# Patient Record
Sex: Female | Born: 1960
Health system: Southern US, Community
[De-identification: ages and names within clinical notes are randomized; demographics above are authoritative.]

## PROBLEM LIST (undated history)

## (undated) DIAGNOSIS — I10 Essential (primary) hypertension: Secondary | ICD-10-CM

## (undated) DIAGNOSIS — F329 Major depressive disorder, single episode, unspecified: Secondary | ICD-10-CM

## (undated) DIAGNOSIS — K635 Polyp of colon: Secondary | ICD-10-CM

## (undated) DIAGNOSIS — M069 Rheumatoid arthritis, unspecified: Secondary | ICD-10-CM

## (undated) DIAGNOSIS — F419 Anxiety disorder, unspecified: Secondary | ICD-10-CM

## (undated) DIAGNOSIS — J8410 Pulmonary fibrosis, unspecified: Secondary | ICD-10-CM

## (undated) DIAGNOSIS — L4050 Arthropathic psoriasis, unspecified: Secondary | ICD-10-CM

## (undated) DIAGNOSIS — F32A Depression, unspecified: Secondary | ICD-10-CM

## (undated) DIAGNOSIS — E785 Hyperlipidemia, unspecified: Secondary | ICD-10-CM

## (undated) HISTORY — PX: TUBAL LIGATION: SHX77

## (undated) HISTORY — PX: BREAST CYST EXCISION: SHX579

## (undated) HISTORY — DX: Polyp of colon: K63.5

## (undated) HISTORY — DX: Hyperlipidemia, unspecified: E78.5

## (undated) HISTORY — DX: Anxiety disorder, unspecified: F41.9

## (undated) HISTORY — DX: Pulmonary fibrosis, unspecified: J84.10

## (undated) HISTORY — DX: Depression, unspecified: F32.A

## (undated) HISTORY — DX: Arthropathic psoriasis, unspecified: L40.50

## (undated) HISTORY — DX: Essential (primary) hypertension: I10

## (undated) HISTORY — DX: Major depressive disorder, single episode, unspecified: F32.9

## (undated) HISTORY — DX: Rheumatoid arthritis, unspecified: M06.9

## (undated) HISTORY — PX: CHOLECYSTECTOMY: SHX55

---

## 1997-12-27 ENCOUNTER — Other Ambulatory Visit: Admission: RE | Admit: 1997-12-27 | Discharge: 1997-12-27 | Payer: Self-pay | Admitting: Obstetrics and Gynecology

## 1999-02-23 ENCOUNTER — Other Ambulatory Visit: Admission: RE | Admit: 1999-02-23 | Discharge: 1999-02-23 | Payer: Self-pay | Admitting: Family Medicine

## 1999-12-15 ENCOUNTER — Encounter: Payer: Self-pay | Admitting: *Deleted

## 1999-12-15 ENCOUNTER — Emergency Department (HOSPITAL_COMMUNITY): Admission: EM | Admit: 1999-12-15 | Discharge: 1999-12-15 | Payer: Self-pay | Admitting: *Deleted

## 2000-02-25 ENCOUNTER — Other Ambulatory Visit: Admission: RE | Admit: 2000-02-25 | Discharge: 2000-02-25 | Payer: Self-pay | Admitting: Obstetrics and Gynecology

## 2001-04-21 ENCOUNTER — Encounter: Admission: RE | Admit: 2001-04-21 | Discharge: 2001-05-26 | Payer: Self-pay | Admitting: Family Medicine

## 2001-12-07 ENCOUNTER — Inpatient Hospital Stay (HOSPITAL_COMMUNITY): Admission: EM | Admit: 2001-12-07 | Discharge: 2001-12-11 | Payer: Self-pay | Admitting: Emergency Medicine

## 2001-12-07 ENCOUNTER — Encounter: Payer: Self-pay | Admitting: Emergency Medicine

## 2001-12-09 ENCOUNTER — Encounter: Payer: Self-pay | Admitting: Family Medicine

## 2001-12-11 ENCOUNTER — Encounter: Payer: Self-pay | Admitting: Family Medicine

## 2001-12-17 ENCOUNTER — Encounter: Admission: RE | Admit: 2001-12-17 | Discharge: 2001-12-17 | Payer: Self-pay | Admitting: Family Medicine

## 2002-08-27 ENCOUNTER — Other Ambulatory Visit: Admission: RE | Admit: 2002-08-27 | Discharge: 2002-08-27 | Payer: Self-pay | Admitting: Family Medicine

## 2004-01-23 ENCOUNTER — Other Ambulatory Visit: Admission: RE | Admit: 2004-01-23 | Discharge: 2004-01-23 | Payer: Self-pay | Admitting: Family Medicine

## 2010-10-06 ENCOUNTER — Encounter: Payer: Self-pay | Admitting: Family Medicine

## 2012-01-07 ENCOUNTER — Encounter: Payer: Self-pay | Admitting: Internal Medicine

## 2012-01-24 ENCOUNTER — Encounter: Payer: Self-pay | Admitting: Internal Medicine

## 2012-01-24 ENCOUNTER — Ambulatory Visit (AMBULATORY_SURGERY_CENTER): Payer: Medicare Other

## 2012-01-24 VITALS — Ht 65.0 in | Wt 179.0 lb

## 2012-01-24 DIAGNOSIS — Z1211 Encounter for screening for malignant neoplasm of colon: Secondary | ICD-10-CM

## 2012-01-24 MED ORDER — NA SULFATE-K SULFATE-MG SULF 17.5-3.13-1.6 GM/177ML PO SOLN: 1.0000 | Freq: Once | ORAL | Status: DC

## 2012-02-06 ENCOUNTER — Ambulatory Visit (AMBULATORY_SURGERY_CENTER): Payer: Medicare Other | Admitting: Internal Medicine

## 2012-02-06 ENCOUNTER — Encounter: Payer: Self-pay | Admitting: Internal Medicine

## 2012-02-06 VITALS — BP 117/62 | HR 66 | Temp 97.8°F | Resp 16 | Ht 65.0 in | Wt 179.0 lb

## 2012-02-06 DIAGNOSIS — D126 Benign neoplasm of colon, unspecified: Secondary | ICD-10-CM

## 2012-02-06 DIAGNOSIS — Z1211 Encounter for screening for malignant neoplasm of colon: Secondary | ICD-10-CM

## 2012-02-06 MED ORDER — SODIUM CHLORIDE 0.9 % IV SOLN: 500.0000 mL | INTRAVENOUS | Status: DC

## 2012-02-06 NOTE — Op Note (Signed)
[facility] [address] [place], [place]  [address]  COLONOSCOPY PROCEDURE REPORT  PATIENT:  Lisa, Ball  MR#:  [id number] BIRTHDATE:  [birth year], 51 yrs. old  GENDER:  female ENDOSCOPIST:  Carie Caddy. Kenneshia Rehm, MD REF. BY:  Rudi Heap, M.D. PROCEDURE DATE:  02/06/2012 PROCEDURE:  Colonoscopy with snare polypectomy, Colon with cold biopsy polypectomy ASA CLASS:  Class II INDICATIONS:  Routine Risk Screening, 1st colonoscopy MEDICATIONS:   MAC sedation, administered by CRNA, propofol (Diprivan) 400 mg IV  DESCRIPTION OF PROCEDURE:   After the risks benefits and alternatives of the procedure were thoroughly explained, informed consent was obtained.  Digital rectal exam was performed and revealed no rectal masses.   The LB [id number]-[id number] endoscope was introduced through the anus and advanced to the cecum, which was identified by both the appendix and ileocecal valve, without limitations.  The quality of the prep was Suprep good.  The instrument was then slowly withdrawn as the colon was fully examined. <<PROCEDUREIMAGES>> FINDINGS:  Two sessile polyps, 2 mm and 5 mm were found in the cecum. Polyps were snared without cautery. Retrieval was successful.   Two sessile polyps, 3 - 6 mm were found in the transverse colon. Polyps were snared without cautery. Retrieval was successful.  Three sessile polyps were found in the descending colon. Two polyps, 2 mm, were removed using cold biopsy forceps. The third polyp ,5 mm, was snared without cautery. Retrieval was successful.  Two sessile, 4 - 5 mm, polyps were found in the sigmoid colon. Polyps were snared without cautery. Retrieval was successful.   Two sessile polyps, 4 mm, were found in the rectum. Polyps were snared without cautery. Retrieval was successful. Retroflexed views in the rectum revealed internal hemorrhoids. The scope was then withdrawn from the cecum and the procedure completed.  COMPLICATIONS:   None  ENDOSCOPIC IMPRESSION: 1) Two polyps in the cecum. Removed and sent to pathology. 2) Two polyps in the transverse colon. Removed and sent to pathology. 3) Three polyps in the descending colon. Removed and sent to pathology. 4) Two polyps in the sigmoid colon. Removed and sent to pathology. 5) Two polyps in the rectum. Removed and sent to pathology. 6) Internal hemorrhoids  RECOMMENDATIONS: 1) Hold aspirin, aspirin products, and anti-inflammatory medication for 2 weeks. 2) Await pathology results    3) If the all the polyps removed today are proven to be adenomatous (pre-cancerous) polyps, you will need a colonoscopy in 1 years. Otherwise, the exam should be repeated in 3 years. You will receive a letter within 1-2 weeks with the results of your bi opsy as well as final recommendations. Please call my office if you have not received a letter after 3 weeks.    4) One of your biggest health concerns is your smoking. This increases your risk for most cancers and serious cardiovascular diseases such as strokes, heart attacks. You should try your best to stop. If you need assistance, please contact your PCP or Sm oking Cessation Class at [facility] [phone]) or [facility] Quit-Line (1-800-QUIT-NOW).  Carie Caddy. Rhea Belton, MD  CC:  Rudi Heap, MD The Patient  n. eSIGNED:   Carie Caddy. Ioan Landini at 02/06/2012 11:32 AM  Servando Snare, [phone]

## 2012-02-06 NOTE — Patient Instructions (Signed)
Discharge instructions given with verbal understanding. Handouts on polyps and hemorrhoids given. Resume previous medications. Hold aspirin and aspirin products for two weeks.YOU HAD AN ENDOSCOPIC PROCEDURE TODAY AT THE [facility]: Refer to the procedure report that was given to you for any specific questions about what was found during the examination.  If the procedure report does not answer your questions, please call your gastroenterologist to clarify.  If you requested that your care partner not be given the details of your procedure findings, then the procedure report has been included in a sealed envelope for you to review at your convenience later.  YOU SHOULD EXPECT: Some feelings of bloating in the abdomen. Passage of more gas than usual.  Walking can help get rid of the air that was put into your GI tract during the procedure and reduce the bloating. If you had a lower endoscopy (such as a colonoscopy or flexible sigmoidoscopy) you may notice spotting of blood in your stool or on the toilet paper. If you underwent a bowel prep for your procedure, then you may not have a normal bowel movement for a few days.  DIET: Your first meal following the procedure should be a light meal and then it is ok to progress to your normal diet.  A half-sandwich or bowl of soup is an example of a good first meal.  Heavy or fried foods are harder to digest and may make you feel nauseous or bloated.  Likewise meals heavy in dairy and vegetables can cause extra gas to form and this can also increase the bloating.  Drink plenty of fluids but you should avoid alcoholic beverages for 24 hours.  ACTIVITY: Your care partner should take you home directly after the procedure.  You should plan to take it easy, moving slowly for the rest of the day.  You can resume normal activity the day after the procedure however you should NOT DRIVE or use heavy machinery for 24 hours (because of the sedation medicines used  during the test).    SYMPTOMS TO REPORT IMMEDIATELY: A gastroenterologist can be reached at any hour.  During normal business hours, 8:30 AM to 5:00 PM Monday through Friday, call [phone].  After hours and on weekends, please call the GI answering service at [phone] who will take a message and have the physician on call contact you.   Following lower endoscopy (colonoscopy or flexible sigmoidoscopy):  Excessive amounts of blood in the stool  Significant tenderness or worsening of abdominal pains  Swelling of the abdomen that is new, acute  Fever of 100F or higher  FOLLOW UP: If any biopsies were taken you will be contacted by phone or by letter within the next 1-3 weeks.  Call your gastroenterologist if you have not heard about the biopsies in 3 weeks.  Our staff will call the home number listed on your records the next business day following your procedure to check on you and address any questions or concerns that you may have at that time regarding the information given to you following your procedure. This is a courtesy call and so if there is no answer at the home number and we have not heard from you through the emergency physician on call, we will assume that you have returned to your regular daily activities without incident.  SIGNATURES/CONFIDENTIALITY: You and/or your care partner have signed paperwork which will be entered into your electronic medical record.  These signatures attest to the fact that that  the information above on your After Visit Summary has been reviewed and is understood.  Full responsibility of the confidentiality of this discharge information lies with you and/or your care-partner.

## 2012-02-06 NOTE — Progress Notes (Signed)
Patient did not experience any of the following events: a burn prior to discharge; a fall within the facility; wrong site/side/patient/procedure/implant event; or a hospital transfer or hospital admission upon discharge from the facility. (G8907) Patient did not have preoperative order for IV antibiotic SSI prophylaxis. (G8918)  

## 2012-02-07 ENCOUNTER — Telehealth: Payer: Self-pay | Admitting: *Deleted

## 2012-02-07 NOTE — Telephone Encounter (Signed)
  Follow up Call-  Call back number 02/06/2012  Post procedure Call Back phone  # [phone]     Patient questions:  Do you have a fever, pain , or abdominal swelling? no Pain Score  0 *  Have you tolerated food without any problems? yes  Have you been able to return to your normal activities? yes  Do you have any questions about your discharge instructions: Diet   no Medications  no Follow up visit  no  Do you have questions or concerns about your Care? no  Actions: * If pain score is 4 or above: No action needed, pain <4.

## 2012-02-14 ENCOUNTER — Encounter: Payer: Self-pay | Admitting: Internal Medicine

## 2012-02-16 ENCOUNTER — Encounter: Payer: Self-pay | Admitting: Internal Medicine

## 2012-12-13 ENCOUNTER — Other Ambulatory Visit: Payer: Self-pay | Admitting: Family Medicine

## 2012-12-29 ENCOUNTER — Other Ambulatory Visit: Payer: Self-pay | Admitting: Nurse Practitioner

## 2013-01-10 ENCOUNTER — Other Ambulatory Visit: Payer: Self-pay | Admitting: Nurse Practitioner

## 2013-01-11 ENCOUNTER — Other Ambulatory Visit: Payer: Self-pay | Admitting: Nurse Practitioner

## 2013-01-11 NOTE — Telephone Encounter (Signed)
LAST OV 1/14 

## 2013-01-12 ENCOUNTER — Other Ambulatory Visit: Payer: Self-pay | Admitting: Nurse Practitioner

## 2013-02-09 ENCOUNTER — Other Ambulatory Visit: Payer: Self-pay | Admitting: Nurse Practitioner

## 2013-03-11 ENCOUNTER — Other Ambulatory Visit: Payer: Self-pay | Admitting: Nurse Practitioner

## 2013-03-22 ENCOUNTER — Encounter: Payer: Self-pay | Admitting: Nurse Practitioner

## 2013-03-22 ENCOUNTER — Ambulatory Visit (INDEPENDENT_AMBULATORY_CARE_PROVIDER_SITE_OTHER): Payer: Medicare Other | Admitting: Nurse Practitioner

## 2013-03-22 VITALS — BP 115/74 | HR 64 | Temp 98.4°F

## 2013-03-22 DIAGNOSIS — J8410 Pulmonary fibrosis, unspecified: Secondary | ICD-10-CM | POA: Insufficient documentation

## 2013-03-22 DIAGNOSIS — E785 Hyperlipidemia, unspecified: Secondary | ICD-10-CM

## 2013-03-22 DIAGNOSIS — L4050 Arthropathic psoriasis, unspecified: Secondary | ICD-10-CM

## 2013-03-22 DIAGNOSIS — M8580 Other specified disorders of bone density and structure, unspecified site: Secondary | ICD-10-CM | POA: Insufficient documentation

## 2013-03-22 DIAGNOSIS — I10 Essential (primary) hypertension: Secondary | ICD-10-CM

## 2013-03-22 DIAGNOSIS — M899 Disorder of bone, unspecified: Secondary | ICD-10-CM

## 2013-03-22 DIAGNOSIS — Z0000 Encounter for general adult medical examination without abnormal findings: Secondary | ICD-10-CM

## 2013-03-22 DIAGNOSIS — M069 Rheumatoid arthritis, unspecified: Secondary | ICD-10-CM

## 2013-03-22 DIAGNOSIS — F329 Major depressive disorder, single episode, unspecified: Secondary | ICD-10-CM | POA: Insufficient documentation

## 2013-03-22 DIAGNOSIS — Z01419 Encounter for gynecological examination (general) (routine) without abnormal findings: Secondary | ICD-10-CM

## 2013-03-22 DIAGNOSIS — Z124 Encounter for screening for malignant neoplasm of cervix: Secondary | ICD-10-CM

## 2013-03-22 DIAGNOSIS — F3289 Other specified depressive episodes: Secondary | ICD-10-CM

## 2013-03-22 DIAGNOSIS — F32A Depression, unspecified: Secondary | ICD-10-CM

## 2013-03-22 LAB — POCT URINALYSIS DIPSTICK
Bilirubin, UA: NEGATIVE
Glucose, UA: NEGATIVE
Ketones, UA: NEGATIVE

## 2013-03-22 LAB — POCT CBC
Granulocyte percent: 54.6 %G (ref 37–80)
Hemoglobin: 14.5 g/dL (ref 12.2–16.2)
MCHC: 35.9 g/dL — AB (ref 31.8–35.4)
MPV: 9.2 fL (ref 0–99.8)
POC Granulocyte: 3.5 (ref 2–6.9)
POC LYMPH PERCENT: 42.1 %L (ref 10–50)

## 2013-03-22 LAB — POCT UA - MICROSCOPIC ONLY
Bacteria, U Microscopic: NEGATIVE
Yeast, UA: NEGATIVE

## 2013-03-22 MED ORDER — ESCITALOPRAM OXALATE 20 MG PO TABS: 20.0000 mg | ORAL_TABLET | Freq: Every day | ORAL | Status: DC

## 2013-03-22 MED ORDER — FENOFIBRATE 160 MG PO TABS: 160.0000 mg | ORAL_TABLET | Freq: Every day | ORAL | Status: DC

## 2013-03-22 MED ORDER — LISINOPRIL-HYDROCHLOROTHIAZIDE 20-12.5 MG PO TABS: 1.0000 | ORAL_TABLET | Freq: Every day | ORAL | Status: DC

## 2013-03-22 MED ORDER — ATORVASTATIN CALCIUM 40 MG PO TABS: 40.0000 mg | ORAL_TABLET | Freq: Every day | ORAL | Status: DC

## 2013-03-22 NOTE — Progress Notes (Signed)
Subjective:    Patient ID: Lisa Ball, female    DOB: 31-Jul-1961, 52 y.o.   MRN: [id number]  HPI Patient here today for annual exam and PAP- SHe is doing quite well- has no complaints today Patient Active Problem List   Diagnosis Date Noted  . Depression 03/22/2013  . Hypertension 03/22/2013  . Postinflammatory pulmonary fibrosis 03/22/2013  . Osteopenia 03/22/2013  . Hyperlipidemia 03/22/2013  . Psoriatic arthritis 03/22/2013   Outpatient Encounter Prescriptions as of 03/22/2013  Medication Sig Dispense Refill  . atorvastatin (LIPITOR) 40 MG tablet TAKE ONE TABLET BY MOUTH ONE TIME DAILY  30 tablet  2  . escitalopram (LEXAPRO) 20 MG tablet TAKE ONE TABLET BY MOUTH ONE TIME DAILY  30 tablet  0  . etanercept (ENBREL) 25 MG injection Inject 50 mg into the skin once a week.      . fenofibrate 160 MG tablet TAKE ONE TABLET BY MOUTH AT BEDTIME  30 tablet  3  . hydroxychloroquine (PLAQUENIL) 200 MG tablet Take by mouth. Take 2 tabs bid      . lisinopril-hydrochlorothiazide (PRINZIDE,ZESTORETIC) 20-12.5 MG per tablet Take 1 tablet by mouth daily.      Marland Kitchen omega-3 acid ethyl esters (LOVAZA) 1 G capsule Take 1 g by mouth 2 (two) times daily.      . [DISCONTINUED] atorvastatin (LIPITOR) 20 MG tablet Take 20 mg by mouth daily.       No facility-administered encounter medications on file as of 03/22/2013.       Review of Systems  All other systems reviewed and are negative.       Objective:   Physical Exam  Constitutional: She is oriented to person, place, and time. She appears well-developed and well-nourished.  HENT:  Head: Normocephalic.  Right Ear: Hearing, tympanic membrane, external ear and ear canal normal.  Left Ear: Hearing, tympanic membrane, external ear and ear canal normal.  Nose: Nose normal.  Mouth/Throat: Uvula is midline and oropharynx is clear and moist.  Eyes: Conjunctivae and EOM are normal. Pupils are equal, round, and reactive to light.  Neck: Normal range of  motion and full passive range of motion without pain. Neck supple. No JVD present. Carotid bruit is not present. No mass and no thyromegaly present.  Cardiovascular: Normal rate, normal heart sounds and intact distal pulses.   No murmur heard. Pulmonary/Chest: Effort normal and breath sounds normal. Right breast exhibits no inverted nipple, no mass, no nipple discharge, no skin change and no tenderness. Left breast exhibits no inverted nipple, no mass, no nipple discharge, no skin change and no tenderness.  Abdominal: Soft. Bowel sounds are normal. She exhibits no mass. There is no tenderness.  Genitourinary: Vagina normal and uterus normal. No breast swelling, tenderness, discharge or bleeding.  bimanual exam-No adnexal masses or tenderness.  Cervix parous and pink and friable   Musculoskeletal: Normal range of motion.  Lymphadenopathy:    She has no cervical adenopathy.  Neurological: She is alert and oriented to person, place, and time.  Skin: Skin is warm and dry.  Psychiatric: She has a normal mood and affect. Her behavior is normal. Judgment and thought content normal.   BP 115/74  Pulse 64  Temp(Src) 98.4 F (36.9 C) (Oral)        Assessment & Plan:   1. Annual physical exam   2. Rheumatoid arthritis   3. Depression   4. Hypertension   5. Postinflammatory pulmonary fibrosis   6. Osteopenia   7. Hyperlipidemia  8. Psoriatic arthritis   9. Encounter for routine gynecological examination    Orders Placed This Encounter  Procedures  . COMPLETE METABOLIC PANEL WITH GFR  . NMR Lipoprofile with Lipids  . Thyroid Panel With TSH  . POCT urinalysis dipstick  . POCT UA - Microscopic Only  . POCT CBC   Meds ordered this encounter  Medications  . atorvastatin (LIPITOR) 40 MG tablet    Sig: Take 1 tablet (40 mg total) by mouth daily.    Dispense:  30 tablet    Refill:  5    Order Specific Question:  Supervising Provider    Answer:  Ernestina Penna [[id number]]  .  escitalopram (LEXAPRO) 20 MG tablet    Sig: Take 1 tablet (20 mg total) by mouth daily.    Dispense:  30 tablet    Refill:  5    Need to be seen before next refill    Order Specific Question:  Supervising Provider    Answer:  Ernestina Penna [[id number]]  . fenofibrate 160 MG tablet    Sig: Take 1 tablet (160 mg total) by mouth daily.    Dispense:  30 tablet    Refill:  5    Order Specific Question:  Supervising Provider    Answer:  Ernestina Penna [[id number]]  . lisinopril-hydrochlorothiazide (PRINZIDE,ZESTORETIC) 20-12.5 MG per tablet    Sig: Take 1 tablet by mouth daily.    Dispense:  30 tablet    Refill:  5    Order Specific Question:  Supervising Provider    Answer:  Ernestina Penna [[id number]]   Continue all meds Labs pending Diet and exercsie encouraged Follow up in 6 mnths Hemocult cards given  [name], FNP

## 2013-03-22 NOTE — Patient Instructions (Addendum)

## 2013-03-23 LAB — THYROID PANEL WITH TSH: T4, Total: 9.8 ug/dL (ref 5.0–12.5)

## 2013-03-23 LAB — NMR LIPOPROFILE WITH LIPIDS
HDL Size: 8.6 nm — ABNORMAL LOW (ref >=9.2)
HDL-C: 22 mg/dL — ABNORMAL LOW (ref >=40)
LDL (calc): 66 mg/dL (ref <100)
LDL Size: 19.7 nm — ABNORMAL LOW (ref >20.5)
LP-IR Score: 74 — ABNORMAL HIGH (ref <=45)
Small LDL Particle Number: 1056 nmol/L — ABNORMAL HIGH (ref <=527)

## 2013-03-23 LAB — COMPLETE METABOLIC PANEL WITH GFR
BUN: 14 mg/dL (ref 6–23)
CO2: 29 mEq/L (ref 19–32)
Creat: 0.92 mg/dL (ref 0.50–1.10)
GFR, Est African American: 83 mL/min
GFR, Est Non African American: 72 mL/min
Glucose, Bld: 88 mg/dL (ref 70–99)
Total Bilirubin: 0.6 mg/dL (ref 0.3–1.2)

## 2013-03-23 LAB — PAP IG W/ RFLX HPV ASCU

## 2013-03-25 ENCOUNTER — Other Ambulatory Visit (INDEPENDENT_AMBULATORY_CARE_PROVIDER_SITE_OTHER): Payer: Medicare Other

## 2013-03-25 DIAGNOSIS — Z1212 Encounter for screening for malignant neoplasm of rectum: Secondary | ICD-10-CM

## 2013-03-26 LAB — FECAL OCCULT BLOOD, IMMUNOCHEMICAL: Fecal Occult Blood: NEGATIVE

## 2013-04-14 ENCOUNTER — Encounter: Payer: Self-pay | Admitting: *Deleted

## 2013-04-19 ENCOUNTER — Encounter: Payer: Self-pay | Admitting: *Deleted

## 2013-09-19 ENCOUNTER — Other Ambulatory Visit: Payer: Self-pay | Admitting: Nurse Practitioner

## 2013-10-04 ENCOUNTER — Other Ambulatory Visit: Payer: Self-pay | Admitting: Nurse Practitioner

## 2013-10-05 NOTE — Telephone Encounter (Signed)
Last seen 03/22/13  MMM  Last lipids 03/22/13

## 2013-10-06 ENCOUNTER — Telehealth: Payer: Self-pay | Admitting: *Deleted

## 2013-10-06 NOTE — Telephone Encounter (Signed)
ntbs for labs 

## 2013-10-06 NOTE — Telephone Encounter (Signed)
Medication called to [facility] vm and please tell pt,NTBS.

## 2013-10-30 ENCOUNTER — Other Ambulatory Visit: Payer: Self-pay | Admitting: Family Medicine

## 2013-11-12 ENCOUNTER — Other Ambulatory Visit: Payer: Self-pay | Admitting: Nurse Practitioner

## 2013-11-16 NOTE — Telephone Encounter (Signed)
Last seen 03/22/13  MMM

## 2013-11-16 NOTE — Telephone Encounter (Signed)
Patient NTBS for follow up and lab work  

## 2013-12-02 ENCOUNTER — Other Ambulatory Visit: Payer: Self-pay | Admitting: Nurse Practitioner

## 2013-12-14 ENCOUNTER — Other Ambulatory Visit: Payer: Self-pay | Admitting: Nurse Practitioner

## 2013-12-29 ENCOUNTER — Other Ambulatory Visit: Payer: Self-pay | Admitting: Nurse Practitioner

## 2014-01-15 ENCOUNTER — Other Ambulatory Visit: Payer: Self-pay | Admitting: Nurse Practitioner

## 2014-01-17 NOTE — Telephone Encounter (Signed)
Patient NTBS for follow up and lab work  

## 2014-01-17 NOTE — Telephone Encounter (Signed)
Last seen 03/22/2013

## 2014-01-29 ENCOUNTER — Other Ambulatory Visit: Payer: Self-pay | Admitting: Nurse Practitioner

## 2014-01-31 NOTE — Telephone Encounter (Signed)
Last seen and last lipid 03/22/13  MMM

## 2014-01-31 NOTE — Telephone Encounter (Signed)
No refill on meds without being see for follow up and labs

## 2014-02-11 ENCOUNTER — Encounter: Payer: Self-pay | Admitting: Internal Medicine

## 2014-02-14 ENCOUNTER — Other Ambulatory Visit: Payer: Self-pay | Admitting: Nurse Practitioner

## 2014-02-24 ENCOUNTER — Other Ambulatory Visit: Payer: Self-pay | Admitting: Nurse Practitioner

## 2014-02-25 NOTE — Telephone Encounter (Signed)
No refills-Patient NTBS for follow up and lab work. Not seen since July 2014

## 2014-03-02 ENCOUNTER — Other Ambulatory Visit: Payer: Self-pay | Admitting: Nurse Practitioner

## 2014-03-05 ENCOUNTER — Other Ambulatory Visit: Payer: Self-pay | Admitting: Family Medicine

## 2014-03-16 ENCOUNTER — Ambulatory Visit (INDEPENDENT_AMBULATORY_CARE_PROVIDER_SITE_OTHER): Payer: Commercial Managed Care - HMO | Admitting: Nurse Practitioner

## 2014-03-16 ENCOUNTER — Encounter: Payer: Self-pay | Admitting: Nurse Practitioner

## 2014-03-16 VITALS — BP 106/70 | HR 72 | Temp 98.6°F | Ht 65.0 in | Wt 178.6 lb

## 2014-03-16 DIAGNOSIS — F3289 Other specified depressive episodes: Secondary | ICD-10-CM

## 2014-03-16 DIAGNOSIS — F32A Depression, unspecified: Secondary | ICD-10-CM

## 2014-03-16 DIAGNOSIS — M8580 Other specified disorders of bone density and structure, unspecified site: Secondary | ICD-10-CM

## 2014-03-16 DIAGNOSIS — E785 Hyperlipidemia, unspecified: Secondary | ICD-10-CM

## 2014-03-16 DIAGNOSIS — M949 Disorder of cartilage, unspecified: Secondary | ICD-10-CM

## 2014-03-16 DIAGNOSIS — F329 Major depressive disorder, single episode, unspecified: Secondary | ICD-10-CM

## 2014-03-16 DIAGNOSIS — M899 Disorder of bone, unspecified: Secondary | ICD-10-CM

## 2014-03-16 DIAGNOSIS — I10 Essential (primary) hypertension: Secondary | ICD-10-CM

## 2014-03-16 DIAGNOSIS — L4050 Arthropathic psoriasis, unspecified: Secondary | ICD-10-CM

## 2014-03-16 MED ORDER — LISINOPRIL-HYDROCHLOROTHIAZIDE 20-12.5 MG PO TABS: ORAL_TABLET | ORAL | Status: DC

## 2014-03-16 MED ORDER — FENOFIBRATE 160 MG PO TABS: ORAL_TABLET | ORAL | Status: DC

## 2014-03-16 MED ORDER — ESCITALOPRAM OXALATE 20 MG PO TABS: ORAL_TABLET | ORAL | Status: DC

## 2014-03-16 MED ORDER — ATORVASTATIN CALCIUM 40 MG PO TABS: ORAL_TABLET | ORAL | Status: DC

## 2014-03-16 NOTE — Patient Instructions (Signed)

## 2014-03-16 NOTE — Progress Notes (Signed)
Subjective:    Patient ID: Lisa Ball, female    DOB: [birth year], 53 y.o.   MRN: [id number]  Hypertension This is a chronic problem. The current episode started more than 1 year ago. The problem is unchanged. The problem is controlled. Pertinent negatives include no chest pain, headaches, palpitations, peripheral edema, shortness of breath or sweats. Risk factors for coronary artery disease include dyslipidemia, family history, post-menopausal state and smoking/tobacco exposure. Past treatments include ACE inhibitors and diuretics. The current treatment provides significant improvement. There are no compliance problems.   Hyperlipidemia This is a chronic problem. The current episode started more than 1 year ago. The problem is controlled. Recent lipid tests were reviewed and are high. Pertinent negatives include no chest pain or shortness of breath. Current antihyperlipidemic treatment includes fibric acid derivatives and statins. The current treatment provides moderate improvement of lipids. Compliance problems include adherence to diet and adherence to exercise.  Risk factors for coronary artery disease include dyslipidemia, family history, hypertension, a sedentary lifestyle and post-menopausal.  psoriatic arthritis Is on paquenel, and enbrel-patient sees rheumatologist every 3-6 months- IS currently doing okay. Depression Patient takes lexapro- works well- keeps her from feeling down and anxious.  Review of Systems  Respiratory: Negative for shortness of breath.   Cardiovascular: Negative for chest pain and palpitations.  Neurological: Negative for headaches.       Objective:   Physical Exam  Constitutional: She is oriented to person, place, and time. She appears well-developed and well-nourished.  HENT:  Nose: Nose normal.  Mouth/Throat: Oropharynx is clear and moist.  Eyes: EOM are normal.  Neck: Trachea normal, normal range of motion and full passive range of motion without  pain. Neck supple. No JVD present. Carotid bruit is not present. No thyromegaly present.  Cardiovascular: Normal rate, regular rhythm, normal heart sounds and intact distal pulses.  Exam reveals no gallop and no friction rub.   No murmur heard. Pulmonary/Chest: Effort normal and breath sounds normal.  Abdominal: Soft. Bowel sounds are normal. She exhibits no distension and no mass. There is no tenderness.  Musculoskeletal: Normal range of motion.  Lymphadenopathy:    She has no cervical adenopathy.  Neurological: She is alert and oriented to person, place, and time. She has normal reflexes.  Skin: Skin is warm and dry.  Plaque psoriasis all over body  Psychiatric: She has a normal mood and affect. Her behavior is normal. Judgment and thought content normal.   BP 106/70  Pulse 72  Temp(Src) 98.6 F (37 C) (Oral)  Ht _0  (1.651 m)  Wt 178 lb 9.6 oz (81.012 kg)  BMI 29.72 kg/m2        Assessment & Plan:   1. Psoriatic arthritis   2. Osteopenia   3. Essential hypertension   4. Hyperlipidemia   5. Depression    Orders Placed This Encounter  Procedures  . CMP14+EGFR  . NMR, lipoprofile   Meds ordered this encounter  Medications  . lisinopril-hydrochlorothiazide (PRINZIDE,ZESTORETIC) 20-12.5 MG per tablet    Sig: TAKE ONE TABLET BY MOUTH ONE TIME DAILY    Dispense:  30 tablet    Refill:  5    Order Specific Question:  Supervising Provider    Answer:  Chipper Herb [[id number]]  . fenofibrate 160 MG tablet    Sig: TAKE ONE TABLET BY MOUTH ONE TIME DAILY    Dispense:  30 tablet    Refill:  5    Order Specific Question:  Supervising Provider  Answer:  Chipper Herb [[id number]]  . escitalopram (LEXAPRO) 20 MG tablet    Sig: TAKE ONE TABLET BY MOUTH ONE TIME DAILY    Dispense:  30 tablet    Refill:  5    Order Specific Question:  Supervising Provider    Answer:  Chipper Herb [[id number]]  . atorvastatin (LIPITOR) 40 MG tablet    Sig: TAKE ONE TABLET BY MOUTH ONE TIME DAILY     Dispense:  30 tablet    Refill:  5    Order Specific Question:  Supervising Provider    Answer:  Chipper Herb [[id number]]   Keep follow up with rheumatologist Labs pending Health maintenance reviewed Diet and exercise encouraged Continue all meds Follow up  In 3 months    [place], FNP

## 2014-03-17 ENCOUNTER — Telehealth: Payer: Self-pay | Admitting: Family Medicine

## 2014-03-17 LAB — NMR, LIPOPROFILE
Cholesterol: 192 mg/dL (ref 100–199)
HDL CHOLESTEROL BY NMR: 43 mg/dL (ref >39)
HDL Particle Number: 37 umol/L (ref >=30.5)
LDL PARTICLE NUMBER: 1251 nmol/L — AB (ref <1000)
LDL Size: 20 nm (ref >20.5)
LDLC SERPL CALC-MCNC: 119 mg/dL — ABNORMAL HIGH (ref 0–99)
LP-IR Score: 72 — ABNORMAL HIGH (ref <=45)
SMALL LDL PARTICLE NUMBER: 831 nmol/L — AB (ref <=527)
TRIGLYCERIDES BY NMR: 150 mg/dL — AB (ref 0–149)

## 2014-03-17 LAB — CMP14+EGFR
A/G RATIO: 1.6 (ref 1.1–2.5)
ALT: 31 IU/L (ref 0–32)
AST: 25 IU/L (ref 0–40)
Albumin: 4.6 g/dL (ref 3.5–5.5)
Alkaline Phosphatase: 76 IU/L (ref 39–117)
BUN/Creatinine Ratio: 12 (ref 9–23)
BUN: 11 mg/dL (ref 6–24)
CALCIUM: 10.5 mg/dL — AB (ref 8.7–10.2)
CO2: 24 mmol/L (ref 18–29)
Chloride: 99 mmol/L (ref 97–108)
Creatinine, Ser: 0.92 mg/dL (ref 0.57–1.00)
GFR calc Af Amer: 82 mL/min/{1.73_m2} (ref >59)
GFR, EST NON AFRICAN AMERICAN: 71 mL/min/{1.73_m2} (ref >59)
GLUCOSE: 98 mg/dL (ref 65–99)
Globulin, Total: 2.9 g/dL (ref 1.5–4.5)
Potassium: 3.8 mmol/L (ref 3.5–5.2)
Sodium: 142 mmol/L (ref 134–144)
TOTAL PROTEIN: 7.5 g/dL (ref 6.0–8.5)
Total Bilirubin: 0.7 mg/dL (ref 0.0–1.2)

## 2014-03-17 NOTE — Telephone Encounter (Signed)
Message copied by Waverly Ferrari on Thu Mar 17, 2014  2:48 PM ------      Message from: Chevis Pretty      Created: Thu Mar 17, 2014  8:13 AM       Kidney and liver function stable      ldl particle numbers and ldl are a little high      Continue current meds- low fat diet and exercise and recheck in 3 months             ------

## 2014-03-23 NOTE — Telephone Encounter (Signed)
Message copied by Cline Crock on Wed Mar 23, 2014  9:32 AM ------      Message from: Chevis Pretty      Created: Thu Mar 17, 2014  8:13 AM       Kidney and liver function stable      ldl particle numbers and ldl are a little high      Continue current meds- low fat diet and exercise and recheck in 3 months             ------

## 2014-03-30 ENCOUNTER — Encounter: Payer: Self-pay | Admitting: Nurse Practitioner

## 2014-06-23 ENCOUNTER — Ambulatory Visit: Payer: Commercial Managed Care - HMO | Admitting: Nurse Practitioner

## 2014-07-21 ENCOUNTER — Ambulatory Visit: Payer: Commercial Managed Care - HMO | Admitting: Nurse Practitioner

## 2014-09-19 ENCOUNTER — Ambulatory Visit (INDEPENDENT_AMBULATORY_CARE_PROVIDER_SITE_OTHER): Payer: Commercial Managed Care - HMO | Admitting: Nurse Practitioner

## 2014-09-19 ENCOUNTER — Ambulatory Visit (INDEPENDENT_AMBULATORY_CARE_PROVIDER_SITE_OTHER): Payer: Commercial Managed Care - HMO

## 2014-09-19 ENCOUNTER — Encounter: Payer: Self-pay | Admitting: Nurse Practitioner

## 2014-09-19 VITALS — BP 125/73 | HR 74 | Temp 97.0°F | Ht 65.0 in | Wt 185.0 lb

## 2014-09-19 DIAGNOSIS — F329 Major depressive disorder, single episode, unspecified: Secondary | ICD-10-CM

## 2014-09-19 DIAGNOSIS — F17200 Nicotine dependence, unspecified, uncomplicated: Secondary | ICD-10-CM | POA: Insufficient documentation

## 2014-09-19 DIAGNOSIS — E785 Hyperlipidemia, unspecified: Secondary | ICD-10-CM

## 2014-09-19 DIAGNOSIS — Z720 Tobacco use: Secondary | ICD-10-CM

## 2014-09-19 DIAGNOSIS — F32A Depression, unspecified: Secondary | ICD-10-CM

## 2014-09-19 DIAGNOSIS — L4050 Arthropathic psoriasis, unspecified: Secondary | ICD-10-CM

## 2014-09-19 DIAGNOSIS — I10 Essential (primary) hypertension: Secondary | ICD-10-CM

## 2014-09-19 MED ORDER — ATORVASTATIN CALCIUM 40 MG PO TABS: ORAL_TABLET | ORAL | Status: DC

## 2014-09-19 MED ORDER — LISINOPRIL-HYDROCHLOROTHIAZIDE 20-12.5 MG PO TABS: ORAL_TABLET | ORAL | Status: DC

## 2014-09-19 MED ORDER — ESCITALOPRAM OXALATE 20 MG PO TABS: ORAL_TABLET | ORAL | Status: DC

## 2014-09-19 MED ORDER — FENOFIBRATE 160 MG PO TABS: ORAL_TABLET | ORAL | Status: DC

## 2014-09-19 NOTE — Progress Notes (Signed)
Subjective:    Patient ID: Lisa Ball, female    DOB: 1961-09-03, 54 y.o.   MRN: [id number]  Hypertension This is a chronic problem. The current episode started more than 1 year ago. The problem is unchanged. The problem is controlled. Pertinent negatives include no chest pain, headaches, palpitations or shortness of breath. Risk factors for coronary artery disease include dyslipidemia, family history and post-menopausal state. Past treatments include ACE inhibitors and diuretics. The current treatment provides significant improvement. Compliance problems include diet and exercise.  Hypertensive end-organ damage includes PVD.  Hyperlipidemia This is a chronic problem. The current episode started more than 1 year ago. The problem is uncontrolled. Recent lipid tests were reviewed and are variable. Factors aggravating her hyperlipidemia include thiazides. Pertinent negatives include no chest pain or shortness of breath. Current antihyperlipidemic treatment includes statins. The current treatment provides moderate improvement of lipids. Compliance problems include adherence to diet and adherence to exercise.  Risk factors for coronary artery disease include dyslipidemia, family history, hypertension and post-menopausal.  psoriatic arthritis Is on paquenel, and enbrel-patient sees rheumatologist every 3-6 months-  currently doing okay. Depression Patient takes lexapro- works well- keeps her from feeling down and anxious.  Review of Systems  Respiratory: Negative for shortness of breath.   Cardiovascular: Negative for chest pain and palpitations.  Neurological: Negative for headaches.       Objective:   Physical Exam  Constitutional: She is oriented to person, place, and time. She appears well-developed and well-nourished.  HENT:  Nose: Nose normal.  Mouth/Throat: Oropharynx is clear and moist.  Eyes: EOM are normal.  Neck: Trachea normal, normal range of motion and full passive range of  motion without pain. Neck supple. No JVD present. Carotid bruit is not present. No thyromegaly present.  Cardiovascular: Normal rate, regular rhythm, normal heart sounds and intact distal pulses.  Exam reveals no gallop and no friction rub.   No murmur heard. Pulmonary/Chest: Effort normal and breath sounds normal.  Abdominal: Soft. Bowel sounds are normal. She exhibits no distension and no mass. There is no tenderness.  Musculoskeletal: Normal range of motion.  Lymphadenopathy:    She has no cervical adenopathy.  Neurological: She is alert and oriented to person, place, and time. She has normal reflexes.  Skin: Skin is warm and dry.  Psychiatric: She has a normal mood and affect. Her behavior is normal. Judgment and thought content normal.   BP 125/73 mmHg  Pulse 74  Temp(Src) 97 F (36.1 C) (Oral)  Ht _0  (1.651 m)  Wt 185 lb (83.915 kg)  BMI 30.79 kg/m2  EKG- NSR-Mary-Margaret Hassell Done, FNP   Chest x ray- Chronic bronchitis changes-Preliminary reading by Ronnald Collum, FNP  [facility]       Assessment & Plan:    1. Essential hypertension Do not add salt to diet - lisinopril-hydrochlorothiazide (PRINZIDE,ZESTORETIC) 20-12.5 MG per tablet; TAKE ONE TABLET BY MOUTH ONE TIME DAILY  Dispense: 30 tablet; Refill: 5 - CMP14+EGFR - EKG 12-Lead  2. Psoriatic arthritis Keep foll  wup appointments with rheumatologist  3. Depression Stress management - escitalopram (LEXAPRO) 20 MG tablet; TAKE ONE TABLET BY MOUTH ONE TIME DAILY  Dispense: 30 tablet; Refill: 5  4. Hyperlipidemia Low fat diet - atorvastatin (LIPITOR) 40 MG tablet; TAKE ONE TABLET BY MOUTH ONE TIME DAILY  Dispense: 30 tablet; Refill: 5 - fenofibrate 160 MG tablet; TAKE ONE TABLET BY MOUTH ONE TIME DAILY  Dispense: 30 tablet; Refill: 5 - NMR, lipoprofile  5. Smoker  Smoking cessation encouraged - DG Chest 2 View; Future    Labs pending Health maintenance reviewed Diet and exercise encouraged Continue all  meds Follow up  In 3 months   [place], FNP

## 2014-09-19 NOTE — Patient Instructions (Signed)
Smoking Cessation Quitting smoking is important to your health and has many advantages. However, it is not always easy to quit since nicotine is a very addictive drug. Oftentimes, people try 3 times or more before being able to quit. This document explains the best ways for you to prepare to quit smoking. Quitting takes hard work and a lot of effort, but you can do it. ADVANTAGES OF QUITTING SMOKING  You will live longer, feel better, and live better.  Your body will feel the impact of quitting smoking almost immediately.  Within 20 minutes, blood pressure decreases. Your pulse returns to its normal level.  After 8 hours, carbon monoxide levels in the blood return to normal. Your oxygen level increases.  After 24 hours, the chance of having a heart attack starts to decrease. Your breath, hair, and body stop smelling like smoke.  After 48 hours, damaged nerve endings begin to recover. Your sense of taste and smell improve.  After 72 hours, the body is virtually free of nicotine. Your bronchial tubes relax and breathing becomes easier.  After 2 to 12 weeks, lungs can hold more air. Exercise becomes easier and circulation improves.  The risk of having a heart attack, stroke, cancer, or lung disease is greatly reduced.  After 1 year, the risk of coronary heart disease is cut in half.  After 5 years, the risk of stroke falls to the same as a nonsmoker.  After 10 years, the risk of lung cancer is cut in half and the risk of other cancers decreases significantly.  After 15 years, the risk of coronary heart disease drops, usually to the level of a nonsmoker.  If you are pregnant, quitting smoking will improve your chances of having a healthy baby.  The people you live with, especially any children, will be healthier.  You will have extra money to spend on things other than cigarettes. QUESTIONS TO THINK ABOUT BEFORE ATTEMPTING TO QUIT You may want to talk about your answers with your  health care provider.  Why do you want to quit?  If you tried to quit in the past, what helped and what did not?  What will be the most difficult situations for you after you quit? How will you plan to handle them?  Who can help you through the tough times? Your family? Friends? A health care provider?  What pleasures do you get from smoking? What ways can you still get pleasure if you quit? Here are some questions to ask your health care provider:  How can you help me to be successful at quitting?  What medicine do you think would be best for me and how should I take it?  What should I do if I need more help?  What is smoking withdrawal like? How can I get information on withdrawal? GET READY  Set a quit date.  Change your environment by getting rid of all cigarettes, ashtrays, matches, and lighters in your home, car, or work. Do not let people smoke in your home.  Review your past attempts to quit. Think about what worked and what did not. GET SUPPORT AND ENCOURAGEMENT You have a better chance of being successful if you have help. You can get support in many ways.  Tell your family, friends, and coworkers that you are going to quit and need their support. Ask them not to smoke around you.  Get individual, group, or telephone counseling and support. Programs are available at local hospitals and health centers. Call   your local health department for information about programs in your area.  Spiritual beliefs and practices may help some smokers quit.  Download a "quit meter" on your computer to keep track of quit statistics, such as how long you have gone without smoking, cigarettes not smoked, and money saved.  Get a self-help book about quitting smoking and staying off tobacco. LEARN NEW SKILLS AND BEHAVIORS  Distract yourself from urges to smoke. Talk to someone, go for a walk, or occupy your time with a task.  Change your normal routine. Take a different route to work.  Drink tea instead of coffee. Eat breakfast in a different place.  Reduce your stress. Take a hot bath, exercise, or read a book.  Plan something enjoyable to do every day. Reward yourself for not smoking.  Explore interactive web-based programs that specialize in helping you quit. GET MEDICINE AND USE IT CORRECTLY Medicines can help you stop smoking and decrease the urge to smoke. Combining medicine with the above behavioral methods and support can greatly increase your chances of successfully quitting smoking.  Nicotine replacement therapy helps deliver nicotine to your body without the negative effects and risks of smoking. Nicotine replacement therapy includes nicotine gum, lozenges, inhalers, nasal sprays, and skin patches. Some may be available over-the-counter and others require a prescription.  Antidepressant medicine helps people abstain from smoking, but how this works is unknown. This medicine is available by prescription.  Nicotinic receptor partial agonist medicine simulates the effect of nicotine in your brain. This medicine is available by prescription. Ask your health care provider for advice about which medicines to use and how to use them based on your health history. Your health care provider will tell you what side effects to look out for if you choose to be on a medicine or therapy. Carefully read the information on the package. Do not use any other product containing nicotine while using a nicotine replacement product.  RELAPSE OR DIFFICULT SITUATIONS Most relapses occur within the first 3 months after quitting. Do not be discouraged if you start smoking again. Remember, most people try several times before finally quitting. You may have symptoms of withdrawal because your body is used to nicotine. You may crave cigarettes, be irritable, feel very hungry, cough often, get headaches, or have difficulty concentrating. The withdrawal symptoms are only temporary. They are strongest  when you first quit, but they will go away within 10-14 days. To reduce the chances of relapse, try to:  Avoid drinking alcohol. Drinking lowers your chances of successfully quitting.  Reduce the amount of caffeine you consume. Once you quit smoking, the amount of caffeine in your body increases and can give you symptoms, such as a rapid heartbeat, sweating, and anxiety.  Avoid smokers because they can make you want to smoke.  Do not let weight gain distract you. Many smokers will gain weight when they quit, usually less than 10 pounds. Eat a healthy diet and stay active. You can always lose the weight gained after you quit.  Find ways to improve your mood other than smoking. FOR MORE INFORMATION  www.smokefree.gov  Document Released: 08/27/2001 Document Revised: 01/17/2014 Document Reviewed: 12/12/2011 [employer] Patient Information 2015 [employer]. This information is not intended to replace advice given to you by your health care provider. Make sure you discuss any questions you have with your health care provider.  

## 2014-09-20 LAB — NMR, LIPOPROFILE
CHOLESTEROL: 132 mg/dL (ref 100–199)
HDL Cholesterol by NMR: 23 mg/dL — ABNORMAL LOW (ref >39)
HDL Particle Number: 23.5 umol/L — ABNORMAL LOW (ref >=30.5)
LDL Particle Number: 1000 nmol/L — ABNORMAL HIGH (ref <1000)
LDL SIZE: 19.6 nm (ref >20.5)
LDL-C: 61 mg/dL (ref 0–99)
LP-IR Score: 76 — ABNORMAL HIGH (ref <=45)
Small LDL Particle Number: 837 nmol/L — ABNORMAL HIGH (ref <=527)
Triglycerides by NMR: 240 mg/dL — ABNORMAL HIGH (ref 0–149)

## 2014-09-20 LAB — CMP14+EGFR
ALT: 29 IU/L (ref 0–32)
AST: 27 IU/L (ref 0–40)
Albumin/Globulin Ratio: 1.4 (ref 1.1–2.5)
Albumin: 4.4 g/dL (ref 3.5–5.5)
Alkaline Phosphatase: 61 IU/L (ref 39–117)
BUN/Creatinine Ratio: 10 (ref 9–23)
BUN: 9 mg/dL (ref 6–24)
CALCIUM: 10.2 mg/dL (ref 8.7–10.2)
CO2: 27 mmol/L (ref 18–29)
Chloride: 99 mmol/L (ref 97–108)
Creatinine, Ser: 0.91 mg/dL (ref 0.57–1.00)
GFR calc Af Amer: 83 mL/min/{1.73_m2} (ref >59)
GFR, EST NON AFRICAN AMERICAN: 72 mL/min/{1.73_m2} (ref >59)
GLOBULIN, TOTAL: 3.1 g/dL (ref 1.5–4.5)
Glucose: 117 mg/dL — ABNORMAL HIGH (ref 65–99)
Potassium: 3.6 mmol/L (ref 3.5–5.2)
SODIUM: 142 mmol/L (ref 134–144)
Total Bilirubin: 0.4 mg/dL (ref 0.0–1.2)
Total Protein: 7.5 g/dL (ref 6.0–8.5)

## 2014-09-23 ENCOUNTER — Other Ambulatory Visit: Payer: Self-pay | Admitting: Nurse Practitioner

## 2014-12-18 ENCOUNTER — Encounter: Payer: Self-pay | Admitting: Internal Medicine

## 2014-12-19 ENCOUNTER — Ambulatory Visit (INDEPENDENT_AMBULATORY_CARE_PROVIDER_SITE_OTHER): Payer: Commercial Managed Care - HMO | Admitting: Nurse Practitioner

## 2014-12-19 ENCOUNTER — Encounter: Payer: Self-pay | Admitting: Nurse Practitioner

## 2014-12-19 VITALS — BP 117/76 | HR 75 | Temp 98.0°F | Ht 65.0 in | Wt 183.0 lb

## 2014-12-19 DIAGNOSIS — Z6830 Body mass index (BMI) 30.0-30.9, adult: Secondary | ICD-10-CM

## 2014-12-19 DIAGNOSIS — L4050 Arthropathic psoriasis, unspecified: Secondary | ICD-10-CM | POA: Diagnosis not present

## 2014-12-19 DIAGNOSIS — Z720 Tobacco use: Secondary | ICD-10-CM | POA: Diagnosis not present

## 2014-12-19 DIAGNOSIS — F32A Depression, unspecified: Secondary | ICD-10-CM

## 2014-12-19 DIAGNOSIS — E785 Hyperlipidemia, unspecified: Secondary | ICD-10-CM | POA: Diagnosis not present

## 2014-12-19 DIAGNOSIS — I10 Essential (primary) hypertension: Secondary | ICD-10-CM

## 2014-12-19 DIAGNOSIS — F329 Major depressive disorder, single episode, unspecified: Secondary | ICD-10-CM

## 2014-12-19 DIAGNOSIS — F17200 Nicotine dependence, unspecified, uncomplicated: Secondary | ICD-10-CM

## 2014-12-19 NOTE — Progress Notes (Signed)
Subjective:    Patient ID: Nonah Mattes, female    DOB: 1961/08/15, 54 y.o.   MRN: [id number]   Patient here today for follow up of chronic medical problems.   Hypertension This is a chronic problem. The current episode started more than 1 year ago. The problem is unchanged. The problem is controlled. Pertinent negatives include no chest pain, headaches, palpitations or shortness of breath. Risk factors for coronary artery disease include dyslipidemia, family history and post-menopausal state. Past treatments include ACE inhibitors and diuretics. The current treatment provides significant improvement. Compliance problems include diet and exercise.  Hypertensive end-organ damage includes PVD.  Hyperlipidemia This is a chronic problem. The current episode started more than 1 year ago. The problem is uncontrolled. Recent lipid tests were reviewed and are variable. Factors aggravating her hyperlipidemia include thiazides. Pertinent negatives include no chest pain or shortness of breath. Current antihyperlipidemic treatment includes statins. The current treatment provides moderate improvement of lipids. Compliance problems include adherence to diet and adherence to exercise.  Risk factors for coronary artery disease include dyslipidemia, family history, hypertension and post-menopausal.  psoriatic arthritis Is on plaquenel, and enbrel-patient sees rheumatologist every 3-6 months-  currently doing okay. Depression Patient takes lexapro- works well- keeps her from feeling down and anxious.  Review of Systems  Constitutional: Negative.   HENT: Negative.   Respiratory: Negative for shortness of breath.   Cardiovascular: Negative for chest pain and palpitations.  Genitourinary: Negative.   Neurological: Negative for headaches.  Psychiatric/Behavioral: Negative.   All other systems reviewed and are negative.      Objective:   Physical Exam  Constitutional: She is oriented to person, place,  and time. She appears well-developed and well-nourished.  HENT:  Nose: Nose normal.  Mouth/Throat: Oropharynx is clear and moist.  Eyes: EOM are normal.  Neck: Trachea normal, normal range of motion and full passive range of motion without pain. Neck supple. No JVD present. Carotid bruit is not present. No thyromegaly present.  Cardiovascular: Normal rate, regular rhythm, normal heart sounds and intact distal pulses.  Exam reveals no gallop and no friction rub.   No murmur heard. Pulmonary/Chest: Effort normal. Respiratory distress: insp nad exp wheezes throughout. She has wheezes.  Abdominal: Soft. Bowel sounds are normal. She exhibits no distension and no mass. There is no tenderness.  Musculoskeletal: Normal range of motion.  Lymphadenopathy:    She has no cervical adenopathy.  Neurological: She is alert and oriented to person, place, and time. She has normal reflexes.  Skin: Skin is warm and dry.  Psychiatric: She has a normal mood and affect. Her behavior is normal. Judgment and thought content normal.   BP 117/76 mmHg  Pulse 75  Temp(Src) 98 F (36.7 C) (Oral)  Ht '5\' 5"'  (1.651 m)  Wt 183 lb (83.008 kg)  BMI 30.45 kg/m2       Assessment & Plan:    1. Essential hypertension Do not add salt to diet - lisinopril-hydrochlorothiazide (PRINZIDE,ZESTORETIC) 20-12.5 MG per tablet; TAKE ONE TABLET BY MOUTH ONE TIME DAILY  Dispense: 30 tablet; Refill: 5 - CMP14+EGFR   2. Psoriatic arthritis Keep follow up appointments with rheumatologist  3. Depression Stress management - escitalopram (LEXAPRO) 20 MG tablet; TAKE ONE TABLET BY MOUTH ONE TIME DAILY  Dispense: 30 tablet; Refill: 5  4. Hyperlipidemia Low fat diet - atorvastatin (LIPITOR) 40 MG tablet; TAKE ONE TABLET BY MOUTH ONE TIME DAILY  Dispense: 30 tablet; Refill: 5 - fenofibrate 160 MG tablet; TAKE  ONE TABLET BY MOUTH ONE TIME DAILY  Dispense: 30 tablet; Refill: 5 - NMR, lipoprofile  5. Smoker Smoking cessation  encouraged  6. BMI Discussed diet and exercise for person with BMI >25 Will recheck weight in 3-6 months   Labs pending Health maintenance reviewed Diet and exercise encouraged Continue all meds Follow up  In 3 months   [place], FNP

## 2014-12-19 NOTE — Patient Instructions (Signed)

## 2014-12-20 LAB — CMP14+EGFR
A/G RATIO: 1.4 (ref 1.1–2.5)
ALT: 27 IU/L (ref 0–32)
AST: 29 IU/L (ref 0–40)
Albumin: 4.3 g/dL (ref 3.5–5.5)
Alkaline Phosphatase: 56 IU/L (ref 39–117)
BILIRUBIN TOTAL: 0.4 mg/dL (ref 0.0–1.2)
BUN/Creatinine Ratio: 12 (ref 9–23)
BUN: 9 mg/dL (ref 6–24)
CALCIUM: 9.8 mg/dL (ref 8.7–10.2)
CO2: 25 mmol/L (ref 18–29)
Chloride: 103 mmol/L (ref 97–108)
Creatinine, Ser: 0.76 mg/dL (ref 0.57–1.00)
GFR calc Af Amer: 104 mL/min/{1.73_m2} (ref >59)
GFR calc non Af Amer: 90 mL/min/{1.73_m2} (ref >59)
GLOBULIN, TOTAL: 3 g/dL (ref 1.5–4.5)
Glucose: 84 mg/dL (ref 65–99)
POTASSIUM: 3.5 mmol/L (ref 3.5–5.2)
SODIUM: 143 mmol/L (ref 134–144)
Total Protein: 7.3 g/dL (ref 6.0–8.5)

## 2014-12-20 LAB — NMR, LIPOPROFILE
Cholesterol: 113 mg/dL (ref 100–199)
HDL CHOLESTEROL BY NMR: 20 mg/dL — AB (ref >39)
HDL Particle Number: 21.4 umol/L — ABNORMAL LOW (ref >=30.5)
LDL PARTICLE NUMBER: 956 nmol/L (ref <1000)
LDL Size: 19.7 nm (ref >20.5)
LDL-C: 48 mg/dL (ref 0–99)
LP-IR Score: 89 — ABNORMAL HIGH (ref <=45)
Small LDL Particle Number: 815 nmol/L — ABNORMAL HIGH (ref <=527)
TRIGLYCERIDES BY NMR: 225 mg/dL — AB (ref 0–149)

## 2014-12-22 ENCOUNTER — Telehealth: Payer: Self-pay | Admitting: *Deleted

## 2014-12-22 NOTE — Telephone Encounter (Signed)
-----   Message from [facility], [place] sent at 12/20/2014  2:26 PM EDT ----- Kidney and liver function stable Cholesterol looks good as far as LDL- trig are elevated- Watch carbs in diet- if  No better  in 3 months will need to add meds Continue current meds- low fat diet and exercise and recheck in 3 months

## 2015-03-29 ENCOUNTER — Other Ambulatory Visit: Payer: Self-pay | Admitting: Nurse Practitioner

## 2015-04-29 ENCOUNTER — Other Ambulatory Visit: Payer: Self-pay | Admitting: Nurse Practitioner

## 2015-04-29 ENCOUNTER — Other Ambulatory Visit: Payer: Self-pay | Admitting: Family Medicine

## 2015-05-01 ENCOUNTER — Encounter: Payer: Self-pay | Admitting: Family Medicine

## 2015-05-28 ENCOUNTER — Other Ambulatory Visit: Payer: Self-pay | Admitting: Nurse Practitioner

## 2015-06-25 ENCOUNTER — Other Ambulatory Visit: Payer: Self-pay | Admitting: Nurse Practitioner

## 2015-07-26 ENCOUNTER — Other Ambulatory Visit: Payer: Self-pay | Admitting: Nurse Practitioner

## 2015-08-28 ENCOUNTER — Other Ambulatory Visit: Payer: Self-pay | Admitting: Nurse Practitioner

## 2015-08-29 NOTE — Telephone Encounter (Signed)
Last seen 12/19/14  MMM Last lipid 09/19/14

## 2015-08-29 NOTE — Telephone Encounter (Signed)
Last refill without being seen 

## 2015-09-25 ENCOUNTER — Other Ambulatory Visit: Payer: Self-pay | Admitting: Nurse Practitioner

## 2015-09-25 DIAGNOSIS — L4050 Arthropathic psoriasis, unspecified: Secondary | ICD-10-CM | POA: Diagnosis not present

## 2015-09-25 DIAGNOSIS — Z79899 Other long term (current) drug therapy: Secondary | ICD-10-CM | POA: Diagnosis not present

## 2015-09-25 DIAGNOSIS — L409 Psoriasis, unspecified: Secondary | ICD-10-CM | POA: Diagnosis not present

## 2015-10-03 ENCOUNTER — Telehealth: Payer: Self-pay | Admitting: Nurse Practitioner

## 2015-10-04 ENCOUNTER — Other Ambulatory Visit: Payer: Self-pay | Admitting: Nurse Practitioner

## 2015-10-05 ENCOUNTER — Encounter: Payer: Self-pay | Admitting: Nurse Practitioner

## 2015-10-05 ENCOUNTER — Ambulatory Visit (INDEPENDENT_AMBULATORY_CARE_PROVIDER_SITE_OTHER): Payer: Medicare Other | Admitting: Nurse Practitioner

## 2015-10-05 VITALS — BP 125/84 | HR 67 | Temp 97.0°F | Ht 65.0 in | Wt 181.0 lb

## 2015-10-05 DIAGNOSIS — Z1212 Encounter for screening for malignant neoplasm of rectum: Secondary | ICD-10-CM | POA: Diagnosis not present

## 2015-10-05 DIAGNOSIS — E785 Hyperlipidemia, unspecified: Secondary | ICD-10-CM | POA: Diagnosis not present

## 2015-10-05 DIAGNOSIS — F329 Major depressive disorder, single episode, unspecified: Secondary | ICD-10-CM | POA: Diagnosis not present

## 2015-10-05 DIAGNOSIS — Z720 Tobacco use: Secondary | ICD-10-CM | POA: Diagnosis not present

## 2015-10-05 DIAGNOSIS — F32A Depression, unspecified: Secondary | ICD-10-CM

## 2015-10-05 DIAGNOSIS — L4050 Arthropathic psoriasis, unspecified: Secondary | ICD-10-CM

## 2015-10-05 DIAGNOSIS — Z1159 Encounter for screening for other viral diseases: Secondary | ICD-10-CM | POA: Diagnosis not present

## 2015-10-05 DIAGNOSIS — I10 Essential (primary) hypertension: Secondary | ICD-10-CM | POA: Diagnosis not present

## 2015-10-05 DIAGNOSIS — F17200 Nicotine dependence, unspecified, uncomplicated: Secondary | ICD-10-CM

## 2015-10-05 MED ORDER — FENOFIBRATE 160 MG PO TABS: 160.0000 mg | ORAL_TABLET | Freq: Every day | ORAL | Status: DC

## 2015-10-05 MED ORDER — LISINOPRIL-HYDROCHLOROTHIAZIDE 20-12.5 MG PO TABS: 1.0000 | ORAL_TABLET | Freq: Every day | ORAL | Status: DC

## 2015-10-05 MED ORDER — ESCITALOPRAM OXALATE 20 MG PO TABS: 20.0000 mg | ORAL_TABLET | Freq: Every day | ORAL | Status: DC

## 2015-10-05 NOTE — Progress Notes (Signed)
Subjective:    Patient ID: Lisa Ball, female    DOB: 1960/11/12, 55 y.o.   MRN: [id number]   Patient here today for follow up of chronic medical problems.  Outpatient Encounter Prescriptions as of 10/05/2015  Medication Sig  . atorvastatin (LIPITOR) 40 MG tablet TAKE ONE TABLET BY MOUTH ONE TIME DAILY  . escitalopram (LEXAPRO) 20 MG tablet TAKE ONE TABLET BY MOUTH ONE TIME DAILY  . etanercept (ENBREL) 25 MG injection Inject 50 mg into the skin once a week.  . fenofibrate 160 MG tablet TAKE ONE TABLET BY MOUTH ONE TIME DAILY  . hydroxychloroquine (PLAQUENIL) 200 MG tablet Take by mouth. Take 2 tabs bid  . lisinopril-hydrochlorothiazide (PRINZIDE,ZESTORETIC) 20-12.5 MG tablet TAKE ONE TABLET BY MOUTH ONE TIME DAILY   No facility-administered encounter medications on file as of 10/05/2015.     Hypertension This is a chronic problem. The current episode started more than 1 year ago. The problem is unchanged. The problem is controlled. Pertinent negatives include no chest pain, headaches, palpitations or shortness of breath. Risk factors for coronary artery disease include dyslipidemia, family history and post-menopausal state. Past treatments include ACE inhibitors and diuretics. The current treatment provides significant improvement. Compliance problems include diet and exercise.  Hypertensive end-organ damage includes PVD.  Hyperlipidemia This is a chronic problem. The current episode started more than 1 year ago. The problem is uncontrolled. Recent lipid tests were reviewed and are variable. Factors aggravating her hyperlipidemia include thiazides. Pertinent negatives include no chest pain or shortness of breath. Current antihyperlipidemic treatment includes statins. The current treatment provides moderate improvement of lipids. Compliance problems include adherence to diet and adherence to exercise.  Risk factors for coronary artery disease include dyslipidemia, family history,  hypertension and post-menopausal.  psoriatic arthritis Is on plaquenel, and enbrel-patient sees rheumatologist every 3-6 months-  currently doing okay. Depression Patient takes lexapro- works well- keeps her from feeling down and anxious.  Review of Systems  Constitutional: Negative.   HENT: Negative.   Respiratory: Negative for shortness of breath.   Cardiovascular: Negative for chest pain and palpitations.  Genitourinary: Negative.   Neurological: Negative for headaches.  Psychiatric/Behavioral: Negative.   All other systems reviewed and are negative.      Objective:   Physical Exam  Constitutional: She is oriented to person, place, and time. She appears well-developed and well-nourished.  HENT:  Nose: Nose normal.  Mouth/Throat: Oropharynx is clear and moist.  Eyes: EOM are normal.  Neck: Trachea normal, normal range of motion and full passive range of motion without pain. Neck supple. No JVD present. Carotid bruit is not present. No thyromegaly present.  Cardiovascular: Normal rate, regular rhythm, normal heart sounds and intact distal pulses.  Exam reveals no gallop and no friction rub.   No murmur heard. Pulmonary/Chest: Effort normal. Respiratory distress: insp nad exp wheezes throughout. She has wheezes.  Abdominal: Soft. Bowel sounds are normal. She exhibits no distension and no mass. There is no tenderness.  Musculoskeletal: Normal range of motion.  Lymphadenopathy:    She has no cervical adenopathy.  Neurological: She is alert and oriented to person, place, and time. She has normal reflexes.  Skin: Skin is warm and dry.  Psychiatric: She has a normal mood and affect. Her behavior is normal. Judgment and thought content normal.   BP 125/84 mmHg  Pulse 67  Temp(Src) 97 F (36.1 C) (Oral)  Ht _0  (1.651 m)  Wt 181 lb (82.101 kg)  BMI 30.12 kg/m2  Assessment & Plan:   1. Essential hypertension Do not add salt to diet - CMP14+EGFR -  lisinopril-hydrochlorothiazide (PRINZIDE,ZESTORETIC) 20-12.5 MG tablet; Take 1 tablet by mouth daily.  Dispense: 30 tablet; Refill: 5  2. Psoriatic arthritis ([place]) Keep follow up with specialist  3. Depression stress management - escitalopram (LEXAPRO) 20 MG tablet; Take 1 tablet (20 mg total) by mouth daily.  Dispense: 30 tablet; Refill: 5  4. Hyperlipidemia Low fat diet - Lipid panel - fenofibrate 160 MG tablet; Take 1 tablet (160 mg total) by mouth daily.  Dispense: 30 tablet; Refill: 5  5. Smoker Smoking cessation encouraged  6. Screening for malignant neoplasm of the rectum - Fecal occult blood, imunochemical; Future  7. Need for hepatitis C screening test - Hepatitis C antibody    Labs pending Health maintenance reviewed Diet and exercise encouraged Continue all meds Follow up  In 3 months   [place], FNP

## 2015-10-05 NOTE — Patient Instructions (Signed)
Smoking Cessation, Tips for Success If you are ready to quit smoking, congratulations! You have chosen to help yourself be healthier. Cigarettes bring nicotine, tar, carbon monoxide, and other irritants into your body. Your lungs, heart, and blood vessels will be able to work better without these poisons. There are many different ways to quit smoking. Nicotine gum, nicotine patches, a nicotine inhaler, or nicotine nasal spray can help with physical craving. Hypnosis, support groups, and medicines help break the habit of smoking. WHAT THINGS CAN I DO TO MAKE QUITTING EASIER?  Here are some tips to help you quit for good:  Pick a date when you will quit smoking completely. Tell all of your friends and family about your plan to quit on that date.  Do not try to slowly cut down on the number of cigarettes you are smoking. Pick a quit date and quit smoking completely starting on that day.  Throw away all cigarettes.   Clean and remove all ashtrays from your home, work, and car.  On a card, write down your reasons for quitting. Carry the card with you and read it when you get the urge to smoke.  Cleanse your body of nicotine. Drink enough water and fluids to keep your urine clear or pale yellow. Do this after quitting to flush the nicotine from your body.  Learn to predict your moods. Do not let a bad situation be your excuse to have a cigarette. Some situations in your life might tempt you into wanting a cigarette.  Never have "just one" cigarette. It leads to wanting another and another. Remind yourself of your decision to quit.  Change habits associated with smoking. If you smoked while driving or when feeling stressed, try other activities to replace smoking. Stand up when drinking your coffee. Brush your teeth after eating. Sit in a different chair when you read the paper. Avoid alcohol while trying to quit, and try to drink fewer caffeinated beverages. Alcohol and caffeine may urge you to  smoke.  Avoid foods and drinks that can trigger a desire to smoke, such as sugary or spicy foods and alcohol.  Ask people who smoke not to smoke around you.  Have something planned to do right after eating or having a cup of coffee. For example, plan to take a walk or exercise.  Try a relaxation exercise to calm you down and decrease your stress. Remember, you may be tense and nervous for the first 2 weeks after you quit, but this will pass.  Find new activities to keep your hands busy. Play with a pen, coin, or rubber band. Doodle or draw things on paper.  Brush your teeth right after eating. This will help cut down on the craving for the taste of tobacco after meals. You can also try mouthwash.   Use oral substitutes in place of cigarettes. Try using lemon drops, carrots, cinnamon sticks, or chewing gum. Keep them handy so they are available when you have the urge to smoke.  When you have the urge to smoke, try deep breathing.  Designate your home as a nonsmoking area.  If you are a heavy smoker, ask your health care provider about a prescription for nicotine chewing gum. It can ease your withdrawal from nicotine.  Reward yourself. Set aside the cigarette money you save and buy yourself something nice.  Look for support from others. Join a support group or smoking cessation program. Ask someone at home or at work to help you with your plan   to quit smoking.  Always ask yourself, "Do I need this cigarette or is this just a reflex?" Tell yourself, "Today, I choose not to smoke," or "I do not want to smoke." You are reminding yourself of your decision to quit.  Do not replace cigarette smoking with electronic cigarettes (commonly called e-cigarettes). The safety of e-cigarettes is unknown, and some may contain harmful chemicals.  If you relapse, do not give up! Plan ahead and think about what you will do the next time you get the urge to smoke. HOW WILL I FEEL WHEN I QUIT SMOKING? You  may have symptoms of withdrawal because your body is used to nicotine (the addictive substance in cigarettes). You may crave cigarettes, be irritable, feel very hungry, cough often, get headaches, or have difficulty concentrating. The withdrawal symptoms are only temporary. They are strongest when you first quit but will go away within 10-14 days. When withdrawal symptoms occur, stay in control. Think about your reasons for quitting. Remind yourself that these are signs that your body is healing and getting used to being without cigarettes. Remember that withdrawal symptoms are easier to treat than the major diseases that smoking can cause.  Even after the withdrawal is over, expect periodic urges to smoke. However, these cravings are generally short lived and will go away whether you smoke or not. Do not smoke! WHAT RESOURCES ARE AVAILABLE TO HELP ME QUIT SMOKING? Your health care provider can direct you to community resources or hospitals for support, which may include:  Group support.  Education.  Hypnosis.  Therapy.   This information is not intended to replace advice given to you by your health care provider. Make sure you discuss any questions you have with your health care provider.   Document Released: 05/31/2004 Document Revised: 09/23/2014 Document Reviewed: 02/18/2013 Elsevier Interactive Patient Education 2016 [employer]  

## 2015-10-06 ENCOUNTER — Ambulatory Visit: Payer: Commercial Managed Care - HMO | Admitting: Nurse Practitioner

## 2015-10-13 ENCOUNTER — Other Ambulatory Visit: Payer: Medicare Other

## 2015-10-13 DIAGNOSIS — Z1212 Encounter for screening for malignant neoplasm of rectum: Secondary | ICD-10-CM

## 2015-10-14 LAB — FECAL OCCULT BLOOD, IMMUNOCHEMICAL: Fecal Occult Bld: NEGATIVE

## 2015-10-19 ENCOUNTER — Other Ambulatory Visit: Payer: Self-pay | Admitting: *Deleted

## 2015-10-19 MED ORDER — ATORVASTATIN CALCIUM 40 MG PO TABS: 40.0000 mg | ORAL_TABLET | Freq: Every day | ORAL | Status: DC

## 2015-10-19 NOTE — Telephone Encounter (Signed)
Never called back, we did receive some faxes and filled them

## 2015-12-12 ENCOUNTER — Other Ambulatory Visit: Payer: Self-pay | Admitting: *Deleted

## 2015-12-12 DIAGNOSIS — F32A Depression, unspecified: Secondary | ICD-10-CM

## 2015-12-12 DIAGNOSIS — F329 Major depressive disorder, single episode, unspecified: Secondary | ICD-10-CM

## 2015-12-12 MED ORDER — ESCITALOPRAM OXALATE 20 MG PO TABS: 20.0000 mg | ORAL_TABLET | Freq: Every day | ORAL | Status: DC

## 2015-12-19 ENCOUNTER — Other Ambulatory Visit: Payer: Self-pay

## 2015-12-19 DIAGNOSIS — E785 Hyperlipidemia, unspecified: Secondary | ICD-10-CM

## 2015-12-19 MED ORDER — FENOFIBRATE 160 MG PO TABS: 160.0000 mg | ORAL_TABLET | Freq: Every day | ORAL | Status: DC

## 2015-12-19 MED ORDER — ATORVASTATIN CALCIUM 40 MG PO TABS: 40.0000 mg | ORAL_TABLET | Freq: Every day | ORAL | Status: DC

## 2016-03-12 ENCOUNTER — Other Ambulatory Visit: Payer: Self-pay | Admitting: Nurse Practitioner

## 2016-03-13 ENCOUNTER — Other Ambulatory Visit: Payer: Self-pay | Admitting: Nurse Practitioner

## 2016-03-25 DIAGNOSIS — L4050 Arthropathic psoriasis, unspecified: Secondary | ICD-10-CM | POA: Diagnosis not present

## 2016-03-25 DIAGNOSIS — L403 Pustulosis palmaris et plantaris: Secondary | ICD-10-CM | POA: Diagnosis not present

## 2016-03-25 DIAGNOSIS — L409 Psoriasis, unspecified: Secondary | ICD-10-CM | POA: Diagnosis not present

## 2016-03-25 DIAGNOSIS — Z79899 Other long term (current) drug therapy: Secondary | ICD-10-CM | POA: Diagnosis not present

## 2016-04-23 DIAGNOSIS — L4050 Arthropathic psoriasis, unspecified: Secondary | ICD-10-CM | POA: Diagnosis not present

## 2016-04-24 DIAGNOSIS — L603 Nail dystrophy: Secondary | ICD-10-CM | POA: Diagnosis not present

## 2016-04-24 DIAGNOSIS — L4050 Arthropathic psoriasis, unspecified: Secondary | ICD-10-CM | POA: Diagnosis not present

## 2016-04-24 DIAGNOSIS — L403 Pustulosis palmaris et plantaris: Secondary | ICD-10-CM | POA: Diagnosis not present

## 2016-04-24 DIAGNOSIS — L409 Psoriasis, unspecified: Secondary | ICD-10-CM | POA: Diagnosis not present

## 2016-04-24 DIAGNOSIS — L853 Xerosis cutis: Secondary | ICD-10-CM | POA: Diagnosis not present

## 2016-06-10 ENCOUNTER — Other Ambulatory Visit: Payer: Self-pay | Admitting: Nurse Practitioner

## 2016-06-10 DIAGNOSIS — E785 Hyperlipidemia, unspecified: Secondary | ICD-10-CM

## 2016-06-10 DIAGNOSIS — F32A Depression, unspecified: Secondary | ICD-10-CM

## 2016-06-10 DIAGNOSIS — F329 Major depressive disorder, single episode, unspecified: Secondary | ICD-10-CM

## 2016-06-10 NOTE — Telephone Encounter (Signed)
Only filled [facility]- needs [facility] for other meds to be filed.

## 2016-06-17 DIAGNOSIS — Z79899 Other long term (current) drug therapy: Secondary | ICD-10-CM | POA: Diagnosis not present

## 2016-06-17 DIAGNOSIS — L409 Psoriasis, unspecified: Secondary | ICD-10-CM | POA: Diagnosis not present

## 2016-06-17 DIAGNOSIS — L403 Pustulosis palmaris et plantaris: Secondary | ICD-10-CM | POA: Diagnosis not present

## 2016-06-17 DIAGNOSIS — L4050 Arthropathic psoriasis, unspecified: Secondary | ICD-10-CM | POA: Diagnosis not present

## 2016-06-20 ENCOUNTER — Ambulatory Visit (INDEPENDENT_AMBULATORY_CARE_PROVIDER_SITE_OTHER): Payer: Medicare Other | Admitting: Nurse Practitioner

## 2016-06-20 ENCOUNTER — Encounter: Payer: Self-pay | Admitting: Nurse Practitioner

## 2016-06-20 VITALS — BP 104/71 | HR 71 | Temp 97.0°F | Ht 65.0 in | Wt 176.0 lb

## 2016-06-20 DIAGNOSIS — F17200 Nicotine dependence, unspecified, uncomplicated: Secondary | ICD-10-CM | POA: Diagnosis not present

## 2016-06-20 DIAGNOSIS — I10 Essential (primary) hypertension: Secondary | ICD-10-CM

## 2016-06-20 DIAGNOSIS — Z1159 Encounter for screening for other viral diseases: Secondary | ICD-10-CM | POA: Diagnosis not present

## 2016-06-20 DIAGNOSIS — L409 Psoriasis, unspecified: Secondary | ICD-10-CM

## 2016-06-20 DIAGNOSIS — E785 Hyperlipidemia, unspecified: Secondary | ICD-10-CM

## 2016-06-20 DIAGNOSIS — Z6829 Body mass index (BMI) 29.0-29.9, adult: Secondary | ICD-10-CM | POA: Diagnosis not present

## 2016-06-20 DIAGNOSIS — F3342 Major depressive disorder, recurrent, in full remission: Secondary | ICD-10-CM | POA: Diagnosis not present

## 2016-06-20 DIAGNOSIS — L4050 Arthropathic psoriasis, unspecified: Secondary | ICD-10-CM | POA: Diagnosis not present

## 2016-06-20 MED ORDER — LISINOPRIL-HYDROCHLOROTHIAZIDE 20-12.5 MG PO TABS: 1.0000 | ORAL_TABLET | Freq: Every day | ORAL | 1 refills | Status: DC

## 2016-06-20 MED ORDER — ESCITALOPRAM OXALATE 20 MG PO TABS
20.0000 mg | ORAL_TABLET | Freq: Every day | ORAL | 1 refills | Status: DC
Start: 2016-06-20 — End: 2016-12-20

## 2016-06-20 MED ORDER — FLURANDRENOLIDE 4 MCG/SQCM EX TAPE: MEDICATED_TAPE | CUTANEOUS | 5 refills | Status: DC

## 2016-06-20 MED ORDER — ATORVASTATIN CALCIUM 40 MG PO TABS: 40.0000 mg | ORAL_TABLET | Freq: Every day | ORAL | 1 refills | Status: DC

## 2016-06-20 MED ORDER — FENOFIBRATE 160 MG PO TABS: 160.0000 mg | ORAL_TABLET | Freq: Every day | ORAL | 1 refills | Status: DC

## 2016-06-20 NOTE — Progress Notes (Signed)
Subjective:    Patient ID: Lisa Ball, female    DOB: 01-06-1961, 55 y.o.   MRN: [id number]   Patient here today for follow up of chronic medical problems. No change since last visit. No complaints.  Outpatient Encounter Prescriptions as of 06/20/2016  Medication Sig  . atorvastatin (LIPITOR) 40 MG tablet Take 1 tablet (40 mg total) by mouth daily.  Marland Kitchen escitalopram (LEXAPRO) 20 MG tablet TAKE 1 TABLET (20 MG TOTAL) BY MOUTH DAILY.  Marland Kitchen etanercept (ENBREL) 25 MG injection Inject 50 mg into the skin once a week.  . fenofibrate 160 MG tablet Take 1 tablet (160 mg total) by mouth daily.  . hydroxychloroquine (PLAQUENIL) 200 MG tablet Take by mouth. Take 2 tabs bid  . lisinopril-hydrochlorothiazide (PRINZIDE,ZESTORETIC) 20-12.5 MG tablet TAKE ONE TABLET BY MOUTH ONE TIME DAILY   No facility-administered encounter medications on file as of 06/20/2016.      Hypertension  This is a chronic problem. The current episode started more than 1 year ago. The problem is unchanged. The problem is controlled. Pertinent negatives include no chest pain, headaches, palpitations or shortness of breath. Risk factors for coronary artery disease include dyslipidemia, family history and post-menopausal state. Past treatments include ACE inhibitors and diuretics. The current treatment provides significant improvement. Compliance problems include diet and exercise.  Hypertensive end-organ damage includes PVD.  Hyperlipidemia  This is a chronic problem. The current episode started more than 1 year ago. The problem is uncontrolled. Recent lipid tests were reviewed and are variable. Factors aggravating her hyperlipidemia include thiazides. Pertinent negatives include no chest pain or shortness of breath. Current antihyperlipidemic treatment includes statins. The current treatment provides moderate improvement of lipids. Compliance problems include adherence to diet and adherence to exercise.  Risk factors for coronary  artery disease include dyslipidemia, family history, hypertension and post-menopausal.  psoriatic arthritis Is on plaquenel, and enbrel-patient sees rheumatologist every 3-6 months-  currently doing okay. Depression Patient takes lexapro- works well- keeps her from feeling down and anxious.  Review of Systems  Constitutional: Negative.   HENT: Negative.   Respiratory: Negative for shortness of breath.   Cardiovascular: Negative for chest pain and palpitations.  Genitourinary: Negative.   Neurological: Negative for headaches.  Psychiatric/Behavioral: Negative.   All other systems reviewed and are negative.      Objective:   Physical Exam  Constitutional: She is oriented to person, place, and time. She appears well-developed and well-nourished.  HENT:  Nose: Nose normal.  Mouth/Throat: Oropharynx is clear and moist.  Eyes: EOM are normal.  Neck: Trachea normal, normal range of motion and full passive range of motion without pain. Neck supple. No JVD present. Carotid bruit is not present. No thyromegaly present.  Cardiovascular: Normal rate, regular rhythm, normal heart sounds and intact distal pulses.  Exam reveals no gallop and no friction rub.   No murmur heard. Pulmonary/Chest: Effort normal. Respiratory distress: insp nad exp wheezes throughout. She has wheezes.  Abdominal: Soft. Bowel sounds are normal. She exhibits no distension and no mass. There is no tenderness.  Musculoskeletal: Normal range of motion.  Lymphadenopathy:    She has no cervical adenopathy.  Neurological: She is alert and oriented to person, place, and time. She has normal reflexes.  Skin: Skin is warm and dry.  scaley patches on bottom of bil feet  Psychiatric: She has a normal mood and affect. Her behavior is normal. Judgment and thought content normal.   BP 104/71   Pulse 71  Temp 97 F (36.1 C) (Oral)   Ht '5\' 5"'  (1.651 m)   Wt 176 lb (79.8 kg)   BMI 29.29 kg/m         Assessment & Plan:    1. Recurrent major depressive disorder, in full remission ([place]) stress management - escitalopram (LEXAPRO) 20 MG tablet; Take 1 tablet (20 mg total) by mouth daily.  Dispense: 90 tablet; Refill: 1  2. Hyperlipidemia, unspecified hyperlipidemia type Low fta diet - fenofibrate 160 MG tablet; Take 1 tablet (160 mg total) by mouth daily.  Dispense: 90 tablet; Refill: 1 - atorvastatin (LIPITOR) 40 MG tablet; Take 1 tablet (40 mg total) by mouth daily.  Dispense: 90 tablet; Refill: 1 - Lipid panel  3. Essential hypertension Do not add salt o diet - lisinopril-hydrochlorothiazide (PRINZIDE,ZESTORETIC) 20-12.5 MG tablet; Take 1 tablet by mouth daily.  Dispense: 90 tablet; Refill: 1 - CMP14+EGFR  4. Psoriatic arthritis ([place]) Keep follow up with rheumatologist  5. Smoker Smoking cessation encouraged  6. BMI 29.0-29.9,adult Discussed diet and exercise for person with BMI >25 Will recheck weight in 3-6 months  7. Psoriasis - Flurandrenolide 4 MCG/SQCM TAPE; Apply to affected area nightly  Dispense: 1 each; Refill: 5    Labs pending Health maintenance reviewed Diet and exercise encouraged Continue all meds Follow up  In 6 months   [place], FNP

## 2016-06-20 NOTE — Patient Instructions (Signed)
Smoking Cessation, Tips for Success If you are ready to quit smoking, congratulations! You have chosen to help yourself be healthier. Cigarettes bring nicotine, tar, carbon monoxide, and other irritants into your body. Your lungs, heart, and blood vessels will be able to work better without these poisons. There are many different ways to quit smoking. Nicotine gum, nicotine patches, a nicotine inhaler, or nicotine nasal spray can help with physical craving. Hypnosis, support groups, and medicines help break the habit of smoking. WHAT THINGS CAN I DO TO MAKE QUITTING EASIER?  Here are some tips to help you quit for good:  Pick a date when you will quit smoking completely. Tell all of your friends and family about your plan to quit on that date.  Do not try to slowly cut down on the number of cigarettes you are smoking. Pick a quit date and quit smoking completely starting on that day.  Throw away all cigarettes.   Clean and remove all ashtrays from your home, work, and car.  On a card, write down your reasons for quitting. Carry the card with you and read it when you get the urge to smoke.  Cleanse your body of nicotine. Drink enough water and fluids to keep your urine clear or pale yellow. Do this after quitting to flush the nicotine from your body.  Learn to predict your moods. Do not let a bad situation be your excuse to have a cigarette. Some situations in your life might tempt you into wanting a cigarette.  Never have "just one" cigarette. It leads to wanting another and another. Remind yourself of your decision to quit.  Change habits associated with smoking. If you smoked while driving or when feeling stressed, try other activities to replace smoking. Stand up when drinking your coffee. Brush your teeth after eating. Sit in a different chair when you read the paper. Avoid alcohol while trying to quit, and try to drink fewer caffeinated beverages. Alcohol and caffeine may urge you to  smoke.  Avoid foods and drinks that can trigger a desire to smoke, such as sugary or spicy foods and alcohol.  Ask people who smoke not to smoke around you.  Have something planned to do right after eating or having a cup of coffee. For example, plan to take a walk or exercise.  Try a relaxation exercise to calm you down and decrease your stress. Remember, you may be tense and nervous for the first 2 weeks after you quit, but this will pass.  Find new activities to keep your hands busy. Play with a pen, coin, or rubber band. Doodle or draw things on paper.  Brush your teeth right after eating. This will help cut down on the craving for the taste of tobacco after meals. You can also try mouthwash.   Use oral substitutes in place of cigarettes. Try using lemon drops, carrots, cinnamon sticks, or chewing gum. Keep them handy so they are available when you have the urge to smoke.  When you have the urge to smoke, try deep breathing.  Designate your home as a nonsmoking area.  If you are a heavy smoker, ask your health care provider about a prescription for nicotine chewing gum. It can ease your withdrawal from nicotine.  Reward yourself. Set aside the cigarette money you save and buy yourself something nice.  Look for support from others. Join a support group or smoking cessation program. Ask someone at home or at work to help you with your plan   to quit smoking.  Always ask yourself, "Do I need this cigarette or is this just a reflex?" Tell yourself, "Today, I choose not to smoke," or "I do not want to smoke." You are reminding yourself of your decision to quit.  Do not replace cigarette smoking with electronic cigarettes (commonly called e-cigarettes). The safety of e-cigarettes is unknown, and some may contain harmful chemicals.  If you relapse, do not give up! Plan ahead and think about what you will do the next time you get the urge to smoke. HOW WILL I FEEL WHEN I QUIT SMOKING? You  may have symptoms of withdrawal because your body is used to nicotine (the addictive substance in cigarettes). You may crave cigarettes, be irritable, feel very hungry, cough often, get headaches, or have difficulty concentrating. The withdrawal symptoms are only temporary. They are strongest when you first quit but will go away within 10-14 days. When withdrawal symptoms occur, stay in control. Think about your reasons for quitting. Remind yourself that these are signs that your body is healing and getting used to being without cigarettes. Remember that withdrawal symptoms are easier to treat than the major diseases that smoking can cause.  Even after the withdrawal is over, expect periodic urges to smoke. However, these cravings are generally short lived and will go away whether you smoke or not. Do not smoke! WHAT RESOURCES ARE AVAILABLE TO HELP ME QUIT SMOKING? Your health care provider can direct you to community resources or hospitals for support, which may include:  Group support.  Education.  Hypnosis.  Therapy.   This information is not intended to replace advice given to you by your health care provider. Make sure you discuss any questions you have with your health care provider.   Document Released: 05/31/2004 Document Revised: 09/23/2014 Document Reviewed: 02/18/2013 Elsevier Interactive Patient Education 2016 [employer]  

## 2016-06-21 LAB — CMP14+EGFR
A/G RATIO: 1.2 (ref 1.2–2.2)
ALBUMIN: 4 g/dL (ref 3.5–5.5)
ALK PHOS: 82 IU/L (ref 39–117)
ALT: 27 IU/L (ref 0–32)
AST: 24 IU/L (ref 0–40)
BUN / CREAT RATIO: 9 (ref 9–23)
BUN: 8 mg/dL (ref 6–24)
Bilirubin Total: 0.5 mg/dL (ref 0.0–1.2)
CO2: 27 mmol/L (ref 18–29)
CREATININE: 0.86 mg/dL (ref 0.57–1.00)
Calcium: 10 mg/dL (ref 8.7–10.2)
Chloride: 99 mmol/L (ref 96–106)
GFR calc Af Amer: 88 mL/min/{1.73_m2} (ref >59)
GFR calc non Af Amer: 76 mL/min/{1.73_m2} (ref >59)
GLOBULIN, TOTAL: 3.3 g/dL (ref 1.5–4.5)
Glucose: 84 mg/dL (ref 65–99)
POTASSIUM: 3.4 mmol/L — AB (ref 3.5–5.2)
SODIUM: 141 mmol/L (ref 134–144)
Total Protein: 7.3 g/dL (ref 6.0–8.5)

## 2016-06-21 LAB — LIPID PANEL
CHOL/HDL RATIO: 6.6 ratio — AB (ref 0.0–4.4)
CHOLESTEROL TOTAL: 125 mg/dL (ref 100–199)
HDL: 19 mg/dL — ABNORMAL LOW (ref >39)
LDL CALC: 46 mg/dL (ref 0–99)
Triglycerides: 298 mg/dL — ABNORMAL HIGH (ref 0–149)
VLDL Cholesterol Cal: 60 mg/dL — ABNORMAL HIGH (ref 5–40)

## 2016-06-21 LAB — HEPATITIS C ANTIBODY: Hep C Virus Ab: 0.1 s/co ratio (ref 0.0–0.9)

## 2016-08-06 DIAGNOSIS — L403 Pustulosis palmaris et plantaris: Secondary | ICD-10-CM | POA: Diagnosis not present

## 2016-08-06 DIAGNOSIS — L409 Psoriasis, unspecified: Secondary | ICD-10-CM | POA: Diagnosis not present

## 2016-08-06 DIAGNOSIS — Z79899 Other long term (current) drug therapy: Secondary | ICD-10-CM | POA: Diagnosis not present

## 2016-09-20 ENCOUNTER — Encounter: Payer: Self-pay | Admitting: Nurse Practitioner

## 2016-09-20 ENCOUNTER — Ambulatory Visit (INDEPENDENT_AMBULATORY_CARE_PROVIDER_SITE_OTHER): Payer: Medicare Other | Admitting: Nurse Practitioner

## 2016-09-20 VITALS — BP 110/70 | HR 80 | Temp 98.1°F | Ht 65.0 in | Wt 180.0 lb

## 2016-09-20 DIAGNOSIS — F17200 Nicotine dependence, unspecified, uncomplicated: Secondary | ICD-10-CM

## 2016-09-20 DIAGNOSIS — E782 Mixed hyperlipidemia: Secondary | ICD-10-CM

## 2016-09-20 DIAGNOSIS — F3342 Major depressive disorder, recurrent, in full remission: Secondary | ICD-10-CM

## 2016-09-20 DIAGNOSIS — M8580 Other specified disorders of bone density and structure, unspecified site: Secondary | ICD-10-CM | POA: Diagnosis not present

## 2016-09-20 DIAGNOSIS — L4050 Arthropathic psoriasis, unspecified: Secondary | ICD-10-CM

## 2016-09-20 DIAGNOSIS — Z6829 Body mass index (BMI) 29.0-29.9, adult: Secondary | ICD-10-CM | POA: Diagnosis not present

## 2016-09-20 DIAGNOSIS — I10 Essential (primary) hypertension: Secondary | ICD-10-CM | POA: Diagnosis not present

## 2016-09-20 NOTE — Progress Notes (Signed)
Subjective:    Patient ID: Lisa Ball, female    DOB: 12/27/1960, 56 y.o.   MRN: [id number]   Patient here today for follow up of chronic medical problems. No change since last visit. No complaints.  Outpatient Encounter Prescriptions as of 09/20/2016  Medication Sig Note  . Adalimumab 40 MG/0.8ML PNKT Inject 1 pen (31m) [place] every other week 09/20/2016: [facility] atorvastatin (LIPITOR) 40 MG tablet Take 1 tablet (40 mg total) by mouth daily.   .Marland Kitchenescitalopram (LEXAPRO) 20 MG tablet Take 1 tablet (20 mg total) by mouth daily.   . fenofibrate 160 MG tablet Take 1 tablet (160 mg total) by mouth daily.   .Marland KitchenFlurandrenolide 4 MCG/SQCM TAPE Apply to affected area nightly   . lisinopril-hydrochlorothiazide (PRINZIDE,ZESTORETIC) 20-12.5 MG tablet Take 1 tablet by mouth daily.    Hypertension  This is a chronic problem. The current episode started more than 1 year ago. The problem is unchanged. The problem is controlled. Pertinent negatives include no chest pain, headaches, palpitations or shortness of breath. Risk factors for coronary artery disease include dyslipidemia, family history and post-menopausal state. Past treatments include ACE inhibitors and diuretics. The current treatment provides significant improvement. Compliance problems include diet and exercise.  Hypertensive end-organ damage includes PVD.  Hyperlipidemia  This is a chronic problem. The current episode started more than 1 year ago. The problem is uncontrolled. Recent lipid tests were reviewed and are variable. Factors aggravating her hyperlipidemia include thiazides. Pertinent negatives include no chest pain or shortness of breath. Current antihyperlipidemic treatment includes statins. The current treatment provides moderate improvement of lipids. Compliance problems include adherence to diet and adherence to exercise.  Risk factors for coronary artery disease include dyslipidemia, family  history, hypertension and post-menopausal.  psoriatic arthritis Is on plaquenel, and enbrel-patient sees rheumatologist every 3-6 months-  currently doing okay. Depression Patient takes lexapro- works well- keeps her from feeling down and anxious. [facility] 09/20/2016 06/20/2016 12/19/2014 09/19/2014 03/16/2014  Decreased Interest 0 0 0 0 0  Down, Depressed, Hopeless 0 0 0 0 0  PHQ - 2 Score 0 0 0 0 0     Review of Systems  Constitutional: Negative.   HENT: Negative.   Respiratory: Negative for shortness of breath.   Cardiovascular: Negative for chest pain and palpitations.  Genitourinary: Negative.   Neurological: Negative for headaches.  Psychiatric/Behavioral: Negative.   All other systems reviewed and are negative.      Objective:   Physical Exam  Constitutional: She is oriented to person, place, and time. She appears well-developed and well-nourished.  HENT:  Nose: Nose normal.  Mouth/Throat: Oropharynx is clear and moist.  Eyes: EOM are normal.  Neck: Trachea normal, normal range of motion and full passive range of motion without pain. Neck supple. No JVD present. Carotid bruit is not present. No thyromegaly present.  Cardiovascular: Normal rate, regular rhythm, normal heart sounds and intact distal pulses.  Exam reveals no gallop and no friction rub.   No murmur heard. Pulmonary/Chest: Effort normal. Respiratory distress: insp nad exp wheezes throughout. She has wheezes.  Abdominal: Soft. Bowel sounds are normal. She exhibits no distension and no mass. There is no tenderness.  Musculoskeletal: Normal range of motion.  Lymphadenopathy:    She has no cervical adenopathy.  Neurological: She is alert and oriented to person, place, and time. She has normal reflexes.  Skin: Skin is warm and dry.  scaley patches on  bottom of bil feet  Psychiatric: She has a normal mood and affect. Her behavior is normal. Judgment and thought content normal.   BP 110/70   Pulse 80    Temp 98.1 F (36.7 C) (Oral)   Ht '5\' 5"'  (1.651 m)   Wt 180 lb (81.6 kg)   BMI 29.95 kg/m       Assessment & Plan:   1. Essential hypertension Low sodium diet - CMP14+EGFR  2. Osteopenia, unspecified location Weight bearing exercises  3. Psoriatic arthritis ([place])  4. BMI 29.0-29.9,adult Discussed diet and exercise for person with BMI >25 Will recheck weight in 3-6 months  5. Recurrent major depressive disorder, in full remission [facility]) Stress management  6. Mixed hyperlipidemia Low fat diet - Lipid panel  7. Smoker Smoking cessation encouraged    Labs pending Health maintenance reviewed Diet and exercise encouraged Continue all meds Follow up  In 3 month   [place], FNP

## 2016-09-20 NOTE — Patient Instructions (Signed)
Steps to Quit Smoking Smoking tobacco can be bad for your health. It can also affect almost every organ in your body. Smoking puts you and people around you at risk for many serious long-lasting (chronic) diseases. Quitting smoking is hard, but it is one of the best things that you can do for your health. It is never too late to quit. What are the benefits of quitting smoking? When you quit smoking, you lower your risk for getting serious diseases and conditions. They can include:  Lung cancer or lung disease.  Heart disease.  Stroke.  Heart attack.  Not being able to have children (infertility).  Weak bones (osteoporosis) and broken bones (fractures). If you have coughing, wheezing, and shortness of breath, those symptoms may get better when you quit. You may also get sick less often. If you are pregnant, quitting smoking can help to lower your chances of having a baby of low birth weight. What can I do to help me quit smoking? Talk with your doctor about what can help you quit smoking. Some things you can do (strategies) include:  Quitting smoking totally, instead of slowly cutting back how much you smoke over a period of time.  Going to in-person counseling. You are more likely to quit if you go to many counseling sessions.  Using resources and support systems, such as:  Online chats with a counselor.  Phone quitlines.  Printed self-help materials.  Support groups or group counseling.  Text messaging programs.  Mobile phone apps or applications.  Taking medicines. Some of these medicines may have nicotine in them. If you are pregnant or breastfeeding, do not take any medicines to quit smoking unless your doctor says it is okay. Talk with your doctor about counseling or other things that can help you. Talk with your doctor about using more than one strategy at the same time, such as taking medicines while you are also going to in-person counseling. This can help make quitting  easier. What things can I do to make it easier to quit? Quitting smoking might feel very hard at first, but there is a lot that you can do to make it easier. Take these steps:  Talk to your family and friends. Ask them to support and encourage you.  Call phone quitlines, reach out to support groups, or work with a counselor.  Ask people who smoke to not smoke around you.  Avoid places that make you want (trigger) to smoke, such as:  Bars.  Parties.  Smoke-break areas at work.  Spend time with people who do not smoke.  Lower the stress in your life. Stress can make you want to smoke. Try these things to help your stress:  Getting regular exercise.  Deep-breathing exercises.  Yoga.  Meditating.  Doing a body scan. To do this, close your eyes, focus on one area of your body at a time from head to toe, and notice which parts of your body are tense. Try to relax the muscles in those areas.  Download or buy apps on your mobile phone or tablet that can help you stick to your quit plan. There are many free apps, such as QuitGuide from the CDC (Centers for Disease Control and Prevention). You can find more support from smokefree.gov and other websites. This information is not intended to replace advice given to you by your health care provider. Make sure you discuss any questions you have with your health care provider. Document Released: 06/29/2009 Document Revised: 04/30/2016 Document   Reviewed: 01/17/2015 Elsevier Interactive Patient Education  2017 Elsevier Inc.  

## 2016-09-21 LAB — LIPID PANEL
CHOLESTEROL TOTAL: 118 mg/dL (ref 100–199)
Chol/HDL Ratio: 5.4 ratio units — ABNORMAL HIGH (ref 0.0–4.4)
HDL: 22 mg/dL — ABNORMAL LOW (ref >39)
LDL CALC: 43 mg/dL (ref 0–99)
Triglycerides: 263 mg/dL — ABNORMAL HIGH (ref 0–149)
VLDL Cholesterol Cal: 53 mg/dL — ABNORMAL HIGH (ref 5–40)

## 2016-09-21 LAB — CMP14+EGFR
ALBUMIN: 4.1 g/dL (ref 3.5–5.5)
ALK PHOS: 79 IU/L (ref 39–117)
ALT: 28 IU/L (ref 0–32)
AST: 24 IU/L (ref 0–40)
Albumin/Globulin Ratio: 1.2 (ref 1.2–2.2)
BILIRUBIN TOTAL: 0.4 mg/dL (ref 0.0–1.2)
BUN / CREAT RATIO: 13 (ref 9–23)
BUN: 12 mg/dL (ref 6–24)
CHLORIDE: 97 mmol/L (ref 96–106)
CO2: 30 mmol/L — ABNORMAL HIGH (ref 18–29)
Calcium: 10.4 mg/dL — ABNORMAL HIGH (ref 8.7–10.2)
Creatinine, Ser: 0.92 mg/dL (ref 0.57–1.00)
GFR calc Af Amer: 81 mL/min/{1.73_m2} (ref >59)
GFR calc non Af Amer: 70 mL/min/{1.73_m2} (ref >59)
GLOBULIN, TOTAL: 3.5 g/dL (ref 1.5–4.5)
Glucose: 97 mg/dL (ref 65–99)
Potassium: 3.8 mmol/L (ref 3.5–5.2)
SODIUM: 142 mmol/L (ref 134–144)
Total Protein: 7.6 g/dL (ref 6.0–8.5)

## 2016-10-28 DIAGNOSIS — L403 Pustulosis palmaris et plantaris: Secondary | ICD-10-CM | POA: Diagnosis not present

## 2016-10-28 DIAGNOSIS — L603 Nail dystrophy: Secondary | ICD-10-CM | POA: Diagnosis not present

## 2016-10-28 DIAGNOSIS — L4050 Arthropathic psoriasis, unspecified: Secondary | ICD-10-CM | POA: Diagnosis not present

## 2016-10-28 DIAGNOSIS — Z79899 Other long term (current) drug therapy: Secondary | ICD-10-CM | POA: Diagnosis not present

## 2016-10-28 DIAGNOSIS — L409 Psoriasis, unspecified: Secondary | ICD-10-CM | POA: Diagnosis not present

## 2016-12-15 ENCOUNTER — Other Ambulatory Visit: Payer: Self-pay | Admitting: Nurse Practitioner

## 2016-12-15 DIAGNOSIS — E785 Hyperlipidemia, unspecified: Secondary | ICD-10-CM

## 2016-12-15 DIAGNOSIS — I10 Essential (primary) hypertension: Secondary | ICD-10-CM

## 2016-12-20 ENCOUNTER — Encounter: Payer: Self-pay | Admitting: Nurse Practitioner

## 2016-12-20 ENCOUNTER — Ambulatory Visit (INDEPENDENT_AMBULATORY_CARE_PROVIDER_SITE_OTHER): Payer: Medicare Other | Admitting: Nurse Practitioner

## 2016-12-20 VITALS — BP 105/69 | HR 66 | Temp 98.0°F | Ht 65.0 in | Wt 177.2 lb

## 2016-12-20 DIAGNOSIS — Z6829 Body mass index (BMI) 29.0-29.9, adult: Secondary | ICD-10-CM | POA: Diagnosis not present

## 2016-12-20 DIAGNOSIS — F17200 Nicotine dependence, unspecified, uncomplicated: Secondary | ICD-10-CM | POA: Diagnosis not present

## 2016-12-20 DIAGNOSIS — I10 Essential (primary) hypertension: Secondary | ICD-10-CM | POA: Diagnosis not present

## 2016-12-20 DIAGNOSIS — L4050 Arthropathic psoriasis, unspecified: Secondary | ICD-10-CM

## 2016-12-20 DIAGNOSIS — E782 Mixed hyperlipidemia: Secondary | ICD-10-CM | POA: Diagnosis not present

## 2016-12-20 DIAGNOSIS — M8580 Other specified disorders of bone density and structure, unspecified site: Secondary | ICD-10-CM

## 2016-12-20 DIAGNOSIS — F3342 Major depressive disorder, recurrent, in full remission: Secondary | ICD-10-CM | POA: Diagnosis not present

## 2016-12-20 MED ORDER — ATORVASTATIN CALCIUM 40 MG PO TABS: 40.0000 mg | ORAL_TABLET | Freq: Every day | ORAL | 1 refills | Status: DC

## 2016-12-20 MED ORDER — LISINOPRIL-HYDROCHLOROTHIAZIDE 20-12.5 MG PO TABS: 1.0000 | ORAL_TABLET | Freq: Every day | ORAL | 1 refills | Status: DC

## 2016-12-20 MED ORDER — ESCITALOPRAM OXALATE 20 MG PO TABS: 20.0000 mg | ORAL_TABLET | Freq: Every day | ORAL | 1 refills | Status: DC

## 2016-12-20 MED ORDER — FENOFIBRATE 160 MG PO TABS: 160.0000 mg | ORAL_TABLET | Freq: Every day | ORAL | 1 refills | Status: DC

## 2016-12-20 NOTE — Progress Notes (Signed)
Subjective:    Patient ID: Lisa Ball, female    DOB: September 25, 1960, 56 y.o.   MRN: [id number]  HPI  Lisa Ball is here today for follow up of chronic medical problem.  Outpatient Encounter Prescriptions as of 12/20/2016  Medication Sig  . atorvastatin (LIPITOR) 40 MG tablet TAKE 1 TABLET (40 MG TOTAL) BY MOUTH DAILY.  Marland Kitchen escitalopram (LEXAPRO) 20 MG tablet Take 1 tablet (20 mg total) by mouth daily.  . fenofibrate 160 MG tablet TAKE 1 TABLET (160 MG TOTAL) BY MOUTH DAILY.  Marland Kitchen Flurandrenolide 4 MCG/SQCM TAPE Apply to affected area nightly  . lisinopril-hydrochlorothiazide (PRINZIDE,ZESTORETIC) 20-12.5 MG tablet TAKE 1 TABLET BY MOUTH DAILY.  Marland Kitchen ustekinumab (STELARA) 45 MG/0.5ML SOSY injection Inject 1 syringe (73m) [place] day one, inject 1 syringe (460m [place] week four, Maintenance inject 1 syring (4548mevery 12 weeks    1. Essential hypertension  Denies HA<SO and chest pain- does not check blood pressures at home  2. Osteopenia, unspecified location  Does some weight bearing exercise but maybe only 1x a week  3. Psoriatic arthritis ([place]- sees Dr. J[place]ine [place]- seems to be helping  4. BMI 29.0-29.9,adult  Has lost 3 lbs since last visit  5. Recurrent major depressive disorder, in full remission ([place]CC[place]vinIs currently on lexapro- no side effects  6. Mixed hyperlipidemia  Does not really watch diet very closely  7. Smoker  Smokes 1 pack a day at least- no desire  To stop smoking    New complaints: None today     Review of Systems  Constitutional: Negative for diaphoresis.  Eyes: Negative for pain.  Respiratory: Negative for shortness of breath.   Cardiovascular: Negative for chest pain, palpitations and leg swelling.  Gastrointestinal: Negative for abdominal pain.  Endocrine: Negative for polydipsia.  Skin: Negative for rash.  Neurological: Negative for dizziness, weakness and headaches.  Hematological: Does not  bruise/bleed easily.       Objective:   Physical Exam  Constitutional: She is oriented to person, place, and time. She appears well-developed and well-nourished.  HENT:  Nose: Nose normal.  Mouth/Throat: Oropharynx is clear and moist.  Eyes: EOM are normal.  Neck: Trachea normal, normal range of motion and full passive range of motion without pain. Neck supple. No JVD present. Carotid bruit is not present. No thyromegaly present.  Cardiovascular: Normal rate, regular rhythm, normal heart sounds and intact distal pulses.  Exam reveals no gallop and no friction rub.   No murmur heard. Pulmonary/Chest: Effort normal and breath sounds normal.  Abdominal: Soft. Bowel sounds are normal. She exhibits no distension and no mass. There is no tenderness.  Musculoskeletal: Normal range of motion.  Lymphadenopathy:    She has no cervical adenopathy.  Neurological: She is alert and oriented to person, place, and time. She has normal reflexes.  Skin: Skin is warm and dry.  bil fett are peeling and cracked from psoriasis  Psychiatric: She has a normal mood and affect. Her behavior is normal. Judgment and thought content normal.   BP 105/69   Pulse 66   Temp 98 F (36.7 C) (Oral)   Ht '5\' 5"'  (1.651 m)   Wt 177 lb 3.2 oz (80.4 kg)   BMI 29.49 kg/m       Assessment & Plan:   1. Essential hypertension Low sodium diet - lisinopril-hydrochlorothiazide (PRINZIDE,ZESTORETIC) 20-12.5 MG tablet; Take 1 tablet by mouth daily.  Dispense: 90 tablet; Refill: 1 - CMP14+EGFR  2. Osteopenia, unspecified location Weight bearing exercises as can tolerate  3. Psoriatic arthritis ([place]) Keep follow up with rheumatologist - CBC with Differential/Platelet  4. BMI 29.0-29.9,adult Discussed diet and exercise for person with BMI >25 Will recheck weight in 3-6 months  5. Recurrent major depressive disorder, in full remission ([place]) Stress management - escitalopram (LEXAPRO) 20 MG tablet; Take 1 tablet (20  mg total) by mouth daily.  Dispense: 90 tablet; Refill: 1  6. Mixed hyperlipidemia Low fat diet - atorvastatin (LIPITOR) 40 MG tablet; Take 1 tablet (40 mg total) by mouth daily.  Dispense: 90 tablet; Refill: 1 - fenofibrate 160 MG tablet; Take 1 tablet (160 mg total) by mouth daily.  Dispense: 90 tablet; Refill: 1 - Lipid panel  7. Smoker Smoking cessation encouraged   Will schedule PAP for next visit Labs pending Health maintenance reviewed Diet and exercise encouraged Continue all meds Follow up  In 6 months   [place], FNP

## 2016-12-20 NOTE — Patient Instructions (Signed)
Fat and Cholesterol Restricted Diet Getting too much fat and cholesterol in your diet may cause health problems. Following this diet helps keep your fat and cholesterol at normal levels. This can keep you from getting sick. What types of fat should I choose?  Choose monosaturated and polyunsaturated fats. These are found in foods such as olive oil, canola oil, flaxseeds, walnuts, almonds, and seeds.  Eat more omega-3 fats. Good choices include salmon, mackerel, sardines, tuna, flaxseed oil, and ground flaxseeds.  Limit saturated fats. These are in animal products such as meats, butter, and cream. They can also be in plant products such as palm oil, palm kernel oil, and coconut oil.  Avoid foods with partially hydrogenated oils in them. These contain trans fats. Examples of foods that have trans fats are stick margarine, some tub margarines, cookies, crackers, and other baked goods. What general guidelines do I need to follow?  Check food labels. Look for the words "trans fat" and "saturated fat."  When preparing a meal:  Fill half of your plate with vegetables and green salads.  Fill one fourth of your plate with whole grains. Look for the word "whole" as the first word in the ingredient list.  Fill one fourth of your plate with lean protein foods.  Eat more foods that have fiber, like apples, carrots, beans, peas, and barley.  Eat more home-cooked foods. Eat less at restaurants and buffets.  Limit or avoid alcohol.  Limit foods high in starch and sugar.  Limit fried foods.  Cook foods without frying them. Baking, boiling, grilling, and broiling are all great options.  Lose weight if you are overweight. Losing even a small amount of weight can help your overall health. It can also help prevent diseases such as diabetes and heart disease. What foods can I eat? Grains  Whole grains, such as whole wheat or whole grain breads, crackers, cereals, and pasta. Unsweetened oatmeal,  bulgur, barley, quinoa, or brown rice. Corn or whole wheat flour tortillas. Vegetables  Fresh or frozen vegetables (raw, steamed, roasted, or grilled). Green salads. Fruits  All fresh, canned (in natural juice), or frozen fruits. Meat and Other Protein Products  Ground beef (85% or leaner), grass-fed beef, or beef trimmed of fat. Skinless chicken or turkey. Ground chicken or turkey. Pork trimmed of fat. All fish and seafood. Eggs. Dried beans, peas, or lentils. Unsalted nuts or seeds. Unsalted canned or dry beans. Dairy  Low-fat dairy products, such as skim or 1% milk, 2% or reduced-fat cheeses, low-fat ricotta or cottage cheese, or plain low-fat yogurt. Fats and Oils  Tub margarines without trans fats. Light or reduced-fat mayonnaise and salad dressings. Avocado. Olive, canola, sesame, or safflower oils. Natural peanut or almond butter (choose ones without added sugar and oil). The items listed above may not be a complete list of recommended foods or beverages. Contact your dietitian for more options.  What foods are not recommended? Grains  White bread. White pasta. White rice. Cornbread. Bagels, pastries, and croissants. Crackers that contain trans fat. Vegetables  White potatoes. Corn. Creamed or fried vegetables. Vegetables in a cheese sauce. Fruits  Dried fruits. Canned fruit in light or heavy syrup. Fruit juice. Meat and Other Protein Products  Fatty cuts of meat. Ribs, chicken wings, bacon, sausage, bologna, salami, chitterlings, fatback, hot dogs, bratwurst, and packaged luncheon meats. Liver and organ meats. Dairy  Whole or 2% milk, cream, half-and-half, and cream cheese. Whole milk cheeses. Whole-fat or sweetened yogurt. Full-fat cheeses. Nondairy creamers and whipped   toppings. Processed cheese, cheese spreads, or cheese curds. Sweets and Desserts  Corn syrup, sugars, honey, and molasses. Candy. Jam and jelly. Syrup. Sweetened cereals. Cookies, pies, cakes, donuts, muffins, and ice  cream. Fats and Oils  Butter, stick margarine, lard, shortening, ghee, or bacon fat. Coconut, palm kernel, or palm oils. Beverages  Alcohol. Sweetened drinks (such as sodas, lemonade, and fruit drinks or punches). The items listed above may not be a complete list of foods and beverages to avoid. Contact your dietitian for more information.  This information is not intended to replace advice given to you by your health care provider. Make sure you discuss any questions you have with your health care provider. Document Released: 03/03/2012 Document Revised: 05/09/2016 Document Reviewed: 12/02/2013 Elsevier Interactive Patient Education  2017 Elsevier Inc.  

## 2016-12-21 LAB — CBC WITH DIFFERENTIAL/PLATELET
BASOS: 0 %
Basophils Absolute: 0.1 10*3/uL (ref 0.0–0.2)
EOS (ABSOLUTE): 0.4 10*3/uL (ref 0.0–0.4)
Eos: 4 %
Hematocrit: 41.5 % (ref 34.0–46.6)
Hemoglobin: 13.9 g/dL (ref 11.1–15.9)
IMMATURE GRANS (ABS): 0 10*3/uL (ref 0.0–0.1)
IMMATURE GRANULOCYTES: 0 %
Lymphocytes Absolute: 4.3 10*3/uL — ABNORMAL HIGH (ref 0.7–3.1)
Lymphs: 39 %
MCH: 30 pg (ref 26.6–33.0)
MCHC: 33.5 g/dL (ref 31.5–35.7)
MCV: 89 fL (ref 79–97)
Monocytes Absolute: 0.7 10*3/uL (ref 0.1–0.9)
Monocytes: 7 %
NEUTROS PCT: 50 %
Neutrophils Absolute: 5.7 10*3/uL (ref 1.4–7.0)
PLATELETS: 412 10*3/uL — AB (ref 150–379)
RBC: 4.64 x10E6/uL (ref 3.77–5.28)
RDW: 13.1 % (ref 12.3–15.4)
WBC: 11.2 10*3/uL — AB (ref 3.4–10.8)

## 2016-12-21 LAB — LIPID PANEL
CHOL/HDL RATIO: 4.7 ratio — AB (ref 0.0–4.4)
CHOLESTEROL TOTAL: 127 mg/dL (ref 100–199)
HDL: 27 mg/dL — ABNORMAL LOW (ref >39)
LDL CALC: 56 mg/dL (ref 0–99)
Triglycerides: 220 mg/dL — ABNORMAL HIGH (ref 0–149)
VLDL Cholesterol Cal: 44 mg/dL — ABNORMAL HIGH (ref 5–40)

## 2016-12-21 LAB — CMP14+EGFR
ALBUMIN: 3.9 g/dL (ref 3.5–5.5)
ALK PHOS: 82 IU/L (ref 39–117)
ALT: 16 IU/L (ref 0–32)
AST: 15 IU/L (ref 0–40)
Albumin/Globulin Ratio: 1.1 — ABNORMAL LOW (ref 1.2–2.2)
BUN / CREAT RATIO: 15 (ref 9–23)
BUN: 15 mg/dL (ref 6–24)
Bilirubin Total: 0.2 mg/dL (ref 0.0–1.2)
CO2: 25 mmol/L (ref 18–29)
CREATININE: 1.03 mg/dL — AB (ref 0.57–1.00)
Calcium: 10.1 mg/dL (ref 8.7–10.2)
Chloride: 97 mmol/L (ref 96–106)
GFR calc non Af Amer: 61 mL/min/{1.73_m2} (ref >59)
GFR, EST AFRICAN AMERICAN: 71 mL/min/{1.73_m2} (ref >59)
GLOBULIN, TOTAL: 3.5 g/dL (ref 1.5–4.5)
Glucose: 83 mg/dL (ref 65–99)
Potassium: 3.5 mmol/L (ref 3.5–5.2)
Sodium: 139 mmol/L (ref 134–144)
Total Protein: 7.4 g/dL (ref 6.0–8.5)

## 2017-01-27 DIAGNOSIS — L4050 Arthropathic psoriasis, unspecified: Secondary | ICD-10-CM | POA: Diagnosis not present

## 2017-01-27 DIAGNOSIS — Z79899 Other long term (current) drug therapy: Secondary | ICD-10-CM | POA: Diagnosis not present

## 2017-01-27 DIAGNOSIS — L409 Psoriasis, unspecified: Secondary | ICD-10-CM | POA: Diagnosis not present

## 2017-01-27 DIAGNOSIS — L603 Nail dystrophy: Secondary | ICD-10-CM | POA: Diagnosis not present

## 2017-03-25 ENCOUNTER — Ambulatory Visit (INDEPENDENT_AMBULATORY_CARE_PROVIDER_SITE_OTHER): Payer: Medicare Other | Admitting: *Deleted

## 2017-03-25 DIAGNOSIS — Z111 Encounter for screening for respiratory tuberculosis: Secondary | ICD-10-CM | POA: Diagnosis not present

## 2017-03-25 DIAGNOSIS — Z23 Encounter for immunization: Secondary | ICD-10-CM

## 2017-03-25 NOTE — Progress Notes (Signed)
PPD placed L forearm Pt tolerated well 

## 2017-03-27 LAB — TB SKIN TEST
Induration: 0 mm
TB Skin Test: NEGATIVE

## 2017-04-29 DIAGNOSIS — L4050 Arthropathic psoriasis, unspecified: Secondary | ICD-10-CM | POA: Diagnosis not present

## 2017-04-29 DIAGNOSIS — L409 Psoriasis, unspecified: Secondary | ICD-10-CM | POA: Diagnosis not present

## 2017-06-10 DIAGNOSIS — L4050 Arthropathic psoriasis, unspecified: Secondary | ICD-10-CM | POA: Diagnosis not present

## 2017-06-10 DIAGNOSIS — L409 Psoriasis, unspecified: Secondary | ICD-10-CM | POA: Diagnosis not present

## 2017-06-10 DIAGNOSIS — Z5181 Encounter for therapeutic drug level monitoring: Secondary | ICD-10-CM | POA: Diagnosis not present

## 2017-06-24 ENCOUNTER — Encounter: Payer: Self-pay | Admitting: Nurse Practitioner

## 2017-06-24 ENCOUNTER — Ambulatory Visit (INDEPENDENT_AMBULATORY_CARE_PROVIDER_SITE_OTHER): Payer: Medicare Other | Admitting: Nurse Practitioner

## 2017-06-24 VITALS — BP 105/76 | HR 69 | Temp 98.5°F | Ht 65.0 in | Wt 173.4 lb

## 2017-06-24 DIAGNOSIS — Z0000 Encounter for general adult medical examination without abnormal findings: Secondary | ICD-10-CM

## 2017-06-24 DIAGNOSIS — I10 Essential (primary) hypertension: Secondary | ICD-10-CM | POA: Diagnosis not present

## 2017-06-24 DIAGNOSIS — L4050 Arthropathic psoriasis, unspecified: Secondary | ICD-10-CM

## 2017-06-24 DIAGNOSIS — M8580 Other specified disorders of bone density and structure, unspecified site: Secondary | ICD-10-CM

## 2017-06-24 DIAGNOSIS — F17200 Nicotine dependence, unspecified, uncomplicated: Secondary | ICD-10-CM

## 2017-06-24 DIAGNOSIS — Z6829 Body mass index (BMI) 29.0-29.9, adult: Secondary | ICD-10-CM

## 2017-06-24 DIAGNOSIS — E782 Mixed hyperlipidemia: Secondary | ICD-10-CM

## 2017-06-24 DIAGNOSIS — F3342 Major depressive disorder, recurrent, in full remission: Secondary | ICD-10-CM

## 2017-06-24 MED ORDER — FENOFIBRATE 160 MG PO TABS: 160.0000 mg | ORAL_TABLET | Freq: Every day | ORAL | 1 refills | Status: DC

## 2017-06-24 MED ORDER — ESCITALOPRAM OXALATE 20 MG PO TABS: 20.0000 mg | ORAL_TABLET | Freq: Every day | ORAL | 1 refills | Status: DC

## 2017-06-24 MED ORDER — LISINOPRIL-HYDROCHLOROTHIAZIDE 20-12.5 MG PO TABS: 1.0000 | ORAL_TABLET | Freq: Every day | ORAL | 1 refills | Status: DC

## 2017-06-24 MED ORDER — ATORVASTATIN CALCIUM 40 MG PO TABS: 40.0000 mg | ORAL_TABLET | Freq: Every day | ORAL | 1 refills | Status: DC

## 2017-06-24 MED ORDER — ETODOLAC 400 MG PO TABS: 400.0000 mg | ORAL_TABLET | Freq: Two times a day (BID) | ORAL | 3 refills | Status: DC

## 2017-06-24 NOTE — Progress Notes (Addendum)
Subjective:    Patient ID: Lisa Ball, female    DOB: [birth year], 56 y.o.   MRN: [id number]  HPI Lisa Ball is here today for follow up of chronic medical problem and a comprehensive physical examination.  Outpatient Encounter Prescriptions as of 06/24/2017  Medication Sig  . atorvastatin (LIPITOR) 40 MG tablet Take 1 tablet (40 mg total) by mouth daily.  Marland Kitchen escitalopram (LEXAPRO) 20 MG tablet Take 1 tablet (20 mg total) by mouth daily.  . fenofibrate 160 MG tablet Take 1 tablet (160 mg total) by mouth daily.  Marland Kitchen Flurandrenolide 4 MCG/SQCM TAPE Apply to affected area nightly  . lisinopril-hydrochlorothiazide (PRINZIDE,ZESTORETIC) 20-12.5 MG tablet Take 1 tablet by mouth daily.  . ustekinumab (STELARA) 45 MG/0.5ML SOSY injection Inject 1 syringe (79m) Heimdal day one, inject 1 syringe (453m  week four, Maintenance inject 1 syring (4547mevery 12 weeks   No facility-administered encounter medications on file as of 06/24/2017.     1. Essential hypertension  Blood pressure well-controlled with Prinzide.  Patient does not check blood pressure at home. BP Readings from Last 3 Encounters:  06/24/17 105/76  12/20/16 105/69  09/20/16 110/70     2. Osteopenia, unspecified location  Patient participates in weight-bearing exercise.  3. Psoriatic arthritis ([place] with Humara now instead of Stelara and managed by dematologist and is being set-up with rheumatology.   4. Recurrent major depressive disorder, in full remission ([place] managed with daily escitalopram.  5. Mixed hyperlipidemia  Managed with atorvastatin.  LFTs monitored regularly.  6. Smoker  Patient continues to smoke about 1 ppd. No interest in quitting at this time.  7. BMI 29.0-29.9,adult  No significant weight gain or loss.    New complaints: None today.  Social history:    Review of Systems  Constitutional: Negative for activity change and appetite change.  HENT: Negative.   Eyes: Negative  for pain.  Respiratory: Negative for cough, shortness of breath and wheezing.   Cardiovascular: Negative for chest pain, palpitations and leg swelling.  Gastrointestinal: Negative for abdominal pain.  Endocrine: Negative for polydipsia.  Genitourinary: Negative.        Vaginal burning x 1 week, minimal yellow-tinged discharge  Skin: Negative for rash.  Neurological: Negative for dizziness, weakness, light-headedness, numbness and headaches.  Hematological: Does not bruise/bleed easily.  Psychiatric/Behavioral: Negative.   All other systems reviewed and are negative.      Objective:   Physical Exam  Constitutional: She is oriented to person, place, and time. She appears well-developed and well-nourished. No distress.  HENT:  Head: Normocephalic.  Right Ear: External ear normal.  Left Ear: External ear normal.  Mouth/Throat: Oropharynx is clear and moist.  Eyes: Pupils are equal, round, and reactive to light. Conjunctivae and EOM are normal.  Neck: Normal range of motion. Neck supple. No thyromegaly present.  Cardiovascular: Normal rate, regular rhythm, normal heart sounds and intact distal pulses.   No murmur heard. Pulmonary/Chest: Effort normal and breath sounds normal. No respiratory distress. She has no wheezes.  Abdominal: Soft. Bowel sounds are normal. She exhibits no distension. There is no tenderness.  Genitourinary: Vagina normal.  Musculoskeletal: Normal range of motion. She exhibits no edema.  Lymphadenopathy:    She has no cervical adenopathy.  Neurological: She is alert and oriented to person, place, and time. She has normal reflexes.  Skin: Skin is warm and dry.  Psychiatric: She has a normal mood and affect. Her behavior is normal.  BP 105/76   Pulse 69   Temp 98.5 F (36.9 C) (Oral)   Ht _0  (1.651 m)   Wt 173 lb 6.4 oz (78.7 kg)   BMI 28.86 kg/m      Assessment & Plan:    Annual Physical exam  1. Essential hypertension Low sodium diet -  lisinopril-hydrochlorothiazide (PRINZIDE,ZESTORETIC) 20-12.5 MG tablet; Take 1 tablet by mouth daily.  Dispense: 90 tablet; Refill: 1 - CMP14+EGFR  2. Osteopenia, unspecified location Weight bearing exercises  3. Psoriatic arthritis ([place]) Keep follow up with rheumatologist  4. Recurrent major depressive disorder, in full remission ([place]) Stress management - escitalopram (LEXAPRO) 20 MG tablet; Take 1 tablet (20 mg total) by mouth daily.  Dispense: 90 tablet; Refill: 1  5. Mixed hyperlipidemia Low fat diet - atorvastatin (LIPITOR) 40 MG tablet; Take 1 tablet (40 mg total) by mouth daily.  Dispense: 90 tablet; Refill: 1 - fenofibrate 160 MG tablet; Take 1 tablet (160 mg total) by mouth daily.  Dispense: 90 tablet; Refill: 1 - Lipid panel  6. Smoker Smoking cessation encouraged  7. BMI 29.0-29.9,adult Discussed diet and exercise for person with BMI >25 Will recheck weight in 3-6 months    Labs pending Health maintenance reviewed Diet and exercise encouraged Continue all meds Follow up  In 3 months   [place], FNP

## 2017-06-24 NOTE — Addendum Note (Signed)
Addended by: Chevis Pretty on: 06/24/2017 03:58 PM   Modules accepted: Orders

## 2017-06-24 NOTE — Addendum Note (Signed)
Addended by: Chevis Pretty on: 06/24/2017 03:49 PM   Modules accepted: Orders

## 2017-06-24 NOTE — Patient Instructions (Signed)
DASH Eating Plan DASH stands for "Dietary Approaches to Stop Hypertension." The DASH eating plan is a healthy eating plan that has been shown to reduce high blood pressure (hypertension). It may also reduce your risk for type 2 diabetes, heart disease, and stroke. The DASH eating plan may also help with weight loss. What are tips for following this plan? General guidelines  Avoid eating more than 2,300 mg (milligrams) of salt (sodium) a day. If you have hypertension, you may need to reduce your sodium intake to 1,500 mg a day.  Limit alcohol intake to no more than 1 drink a day for nonpregnant women and 2 drinks a day for men. One drink equals 12 oz of beer, 5 oz of wine, or 1 oz of hard liquor.  Work with your health care provider to maintain a healthy body weight or to lose weight. Ask what an ideal weight is for you.  Get at least 30 minutes of exercise that causes your heart to beat faster (aerobic exercise) most days of the week. Activities may include walking, swimming, or biking.  Work with your health care provider or diet and nutrition specialist (dietitian) to adjust your eating plan to your individual calorie needs. Reading food labels  Check food labels for the amount of sodium per serving. Choose foods with less than 5 percent of the Daily Value of sodium. Generally, foods with less than 300 mg of sodium per serving fit into this eating plan.  To find whole grains, look for the word "whole" as the first word in the ingredient list. Shopping  Buy products labeled as "low-sodium" or "no salt added."  Buy fresh foods. Avoid canned foods and premade or frozen meals. Cooking  Avoid adding salt when cooking. Use salt-free seasonings or herbs instead of table salt or sea salt. Check with your health care provider or pharmacist before using salt substitutes.  Do not fry foods. Cook foods using healthy methods such as baking, boiling, grilling, and broiling instead.  Cook with  heart-healthy oils, such as olive, canola, soybean, or sunflower oil. Meal planning   Eat a balanced diet that includes: ? 5 or more servings of fruits and vegetables each day. At each meal, try to fill half of your plate with fruits and vegetables. ? Up to 6-8 servings of whole grains each day. ? Less than 6 oz of lean meat, poultry, or fish each day. A 3-oz serving of meat is about the same size as a deck of cards. One egg equals 1 oz. ? 2 servings of low-fat dairy each day. ? A serving of nuts, seeds, or beans 5 times each week. ? Heart-healthy fats. Healthy fats called Omega-3 fatty acids are found in foods such as flaxseeds and coldwater fish, like sardines, salmon, and mackerel.  Limit how much you eat of the following: ? Canned or prepackaged foods. ? Food that is high in trans fat, such as fried foods. ? Food that is high in saturated fat, such as fatty meat. ? Sweets, desserts, sugary drinks, and other foods with added sugar. ? Full-fat dairy products.  Do not salt foods before eating.  Try to eat at least 2 vegetarian meals each week.  Eat more home-cooked food and less restaurant, buffet, and fast food.  When eating at a restaurant, ask that your food be prepared with less salt or no salt, if possible. What foods are recommended? The items listed may not be a complete list. Talk with your dietitian about what   dietary choices are best for you. Grains Whole-grain or whole-wheat bread. Whole-grain or whole-wheat pasta. Brown rice. Oatmeal. Quinoa. Bulgur. Whole-grain and low-sodium cereals. Pita bread. Low-fat, low-sodium crackers. Whole-wheat flour tortillas. Vegetables Fresh or frozen vegetables (raw, steamed, roasted, or grilled). Low-sodium or reduced-sodium tomato and vegetable juice. Low-sodium or reduced-sodium tomato sauce and tomato paste. Low-sodium or reduced-sodium canned vegetables. Fruits All fresh, dried, or frozen fruit. Canned fruit in natural juice (without  added sugar). Meat and other protein foods Skinless chicken or turkey. Ground chicken or turkey. Pork with fat trimmed off. Fish and seafood. Egg whites. Dried beans, peas, or lentils. Unsalted nuts, nut butters, and seeds. Unsalted canned beans. Lean cuts of beef with fat trimmed off. Low-sodium, lean deli meat. Dairy Low-fat (1%) or fat-free (skim) milk. Fat-free, low-fat, or reduced-fat cheeses. Nonfat, low-sodium ricotta or cottage cheese. Low-fat or nonfat yogurt. Low-fat, low-sodium cheese. Fats and oils Soft margarine without trans fats. Vegetable oil. Low-fat, reduced-fat, or light mayonnaise and salad dressings (reduced-sodium). Canola, safflower, olive, soybean, and sunflower oils. Avocado. Seasoning and other foods Herbs. Spices. Seasoning mixes without salt. Unsalted popcorn and pretzels. Fat-free sweets. What foods are not recommended? The items listed may not be a complete list. Talk with your dietitian about what dietary choices are best for you. Grains Baked goods made with fat, such as croissants, muffins, or some breads. Dry pasta or rice meal packs. Vegetables Creamed or fried vegetables. Vegetables in a cheese sauce. Regular canned vegetables (not low-sodium or reduced-sodium). Regular canned tomato sauce and paste (not low-sodium or reduced-sodium). Regular tomato and vegetable juice (not low-sodium or reduced-sodium). Pickles. Olives. Fruits Canned fruit in a light or heavy syrup. Fried fruit. Fruit in cream or butter sauce. Meat and other protein foods Fatty cuts of meat. Ribs. Fried meat. Bacon. Sausage. Bologna and other processed lunch meats. Salami. Fatback. Hotdogs. Bratwurst. Salted nuts and seeds. Canned beans with added salt. Canned or smoked fish. Whole eggs or egg yolks. Chicken or turkey with skin. Dairy Whole or 2% milk, cream, and half-and-half. Whole or full-fat cream cheese. Whole-fat or sweetened yogurt. Full-fat cheese. Nondairy creamers. Whipped toppings.  Processed cheese and cheese spreads. Fats and oils Butter. Stick margarine. Lard. Shortening. Ghee. Bacon fat. Tropical oils, such as coconut, palm kernel, or palm oil. Seasoning and other foods Salted popcorn and pretzels. Onion salt, garlic salt, seasoned salt, table salt, and sea salt. Worcestershire sauce. Tartar sauce. Barbecue sauce. Teriyaki sauce. Soy sauce, including reduced-sodium. Steak sauce. Canned and packaged gravies. Fish sauce. Oyster sauce. Cocktail sauce. Horseradish that you find on the shelf. Ketchup. Mustard. Meat flavorings and tenderizers. Bouillon cubes. Hot sauce and Tabasco sauce. Premade or packaged marinades. Premade or packaged taco seasonings. Relishes. Regular salad dressings. Where to find more information:  National Heart, Lung, and Blood Institute: www.nhlbi.nih.gov  American Heart Association: www.heart.org Summary  The DASH eating plan is a healthy eating plan that has been shown to reduce high blood pressure (hypertension). It may also reduce your risk for type 2 diabetes, heart disease, and stroke.  With the DASH eating plan, you should limit salt (sodium) intake to 2,300 mg a day. If you have hypertension, you may need to reduce your sodium intake to 1,500 mg a day.  When on the DASH eating plan, aim to eat more fresh fruits and vegetables, whole grains, lean proteins, low-fat dairy, and heart-healthy fats.  Work with your health care provider or diet and nutrition specialist (dietitian) to adjust your eating plan to your individual   calorie needs. This information is not intended to replace advice given to you by your health care provider. Make sure you discuss any questions you have with your health care provider. Document Released: 08/22/2011 Document Revised: 08/26/2016 Document Reviewed: 08/26/2016 Elsevier Interactive Patient Education  2017 Elsevier Inc.  

## 2017-06-24 NOTE — Addendum Note (Signed)
Addended by: Chevis Pretty on: 06/24/2017 03:55 PM   Modules accepted: Orders, Level of Service

## 2017-06-25 LAB — CMP14+EGFR
ALK PHOS: 56 IU/L (ref 39–117)
ALT: 18 IU/L (ref 0–32)
AST: 16 IU/L (ref 0–40)
Albumin/Globulin Ratio: 1.6 (ref 1.2–2.2)
Albumin: 4.4 g/dL (ref 3.5–5.5)
BUN/Creatinine Ratio: 12 (ref 9–23)
BUN: 8 mg/dL (ref 6–24)
Bilirubin Total: 0.5 mg/dL (ref 0.0–1.2)
CALCIUM: 10 mg/dL (ref 8.7–10.2)
CO2: 27 mmol/L (ref 20–29)
CREATININE: 0.69 mg/dL (ref 0.57–1.00)
Chloride: 99 mmol/L (ref 96–106)
GFR calc Af Amer: 113 mL/min/{1.73_m2} (ref >59)
GFR, EST NON AFRICAN AMERICAN: 98 mL/min/{1.73_m2} (ref >59)
GLOBULIN, TOTAL: 2.8 g/dL (ref 1.5–4.5)
GLUCOSE: 76 mg/dL (ref 65–99)
Potassium: 3.6 mmol/L (ref 3.5–5.2)
SODIUM: 141 mmol/L (ref 134–144)
Total Protein: 7.2 g/dL (ref 6.0–8.5)

## 2017-06-25 LAB — CBC WITH DIFFERENTIAL/PLATELET
BASOS: 1 %
Basophils Absolute: 0 10*3/uL (ref 0.0–0.2)
EOS (ABSOLUTE): 0.4 10*3/uL (ref 0.0–0.4)
Eos: 5 %
HEMOGLOBIN: 14.2 g/dL (ref 11.1–15.9)
Hematocrit: 41.9 % (ref 34.0–46.6)
IMMATURE GRANS (ABS): 0 10*3/uL (ref 0.0–0.1)
Immature Granulocytes: 0 %
LYMPHS ABS: 2.8 10*3/uL (ref 0.7–3.1)
LYMPHS: 32 %
MCH: 30.3 pg (ref 26.6–33.0)
MCHC: 33.9 g/dL (ref 31.5–35.7)
MCV: 89 fL (ref 79–97)
Monocytes Absolute: 0.7 10*3/uL (ref 0.1–0.9)
Monocytes: 8 %
NEUTROS ABS: 4.8 10*3/uL (ref 1.4–7.0)
Neutrophils: 54 %
PLATELETS: 416 10*3/uL — AB (ref 150–379)
RBC: 4.69 x10E6/uL (ref 3.77–5.28)
RDW: 13.1 % (ref 12.3–15.4)
WBC: 8.8 10*3/uL (ref 3.4–10.8)

## 2017-06-25 LAB — THYROID PANEL WITH TSH
Free Thyroxine Index: 2.1 (ref 1.2–4.9)
T3 Uptake Ratio: 24 % (ref 24–39)
T4, Total: 8.7 ug/dL (ref 4.5–12.0)
TSH: 2.3 u[IU]/mL (ref 0.450–4.500)

## 2017-06-25 LAB — IGP, APTIMA HPV, RFX 16/18,45
HPV Aptima: NEGATIVE
PAP Smear Comment: 0

## 2017-06-25 LAB — LIPID PANEL
Chol/HDL Ratio: 6.3 ratio — ABNORMAL HIGH (ref 0.0–4.4)
Cholesterol, Total: 120 mg/dL (ref 100–199)
HDL: 19 mg/dL — ABNORMAL LOW
LDL Calculated: 48 mg/dL (ref 0–99)
Triglycerides: 263 mg/dL — ABNORMAL HIGH (ref 0–149)
VLDL Cholesterol Cal: 53 mg/dL — ABNORMAL HIGH (ref 5–40)

## 2017-06-27 ENCOUNTER — Encounter: Payer: Self-pay | Admitting: *Deleted

## 2017-09-24 ENCOUNTER — Telehealth: Payer: Self-pay | Admitting: Nurse Practitioner

## 2017-09-24 DIAGNOSIS — Z111 Encounter for screening for respiratory tuberculosis: Secondary | ICD-10-CM

## 2017-09-25 NOTE — Telephone Encounter (Signed)
Pt needs the TB Quantiferron blood work for her dermatologist so she can continue to get her Humira. Future order placed for pt to come have the blood drawn.

## 2017-09-25 NOTE — Telephone Encounter (Signed)
Can come in and just tell front need TB skin test- whay does she need it?

## 2017-09-26 ENCOUNTER — Other Ambulatory Visit: Payer: Medicare Other

## 2017-09-26 ENCOUNTER — Ambulatory Visit: Payer: Medicare Other

## 2017-09-26 DIAGNOSIS — Z111 Encounter for screening for respiratory tuberculosis: Secondary | ICD-10-CM

## 2017-09-30 LAB — QUANTIFERON-TB GOLD PLUS
QuantiFERON Nil Value: 0.15 IU/mL
QuantiFERON TB1 Ag Value: 0.17 IU/mL
QuantiFERON TB2 Ag Value: 0.11 IU/mL
QuantiFERON-TB Gold Plus: NEGATIVE

## 2017-10-17 ENCOUNTER — Other Ambulatory Visit: Payer: Self-pay | Admitting: *Deleted

## 2017-10-17 MED ORDER — ETODOLAC 400 MG PO TABS: 400.0000 mg | ORAL_TABLET | Freq: Two times a day (BID) | ORAL | 0 refills | Status: DC

## 2017-12-08 DIAGNOSIS — L4050 Arthropathic psoriasis, unspecified: Secondary | ICD-10-CM | POA: Diagnosis not present

## 2017-12-08 DIAGNOSIS — L409 Psoriasis, unspecified: Secondary | ICD-10-CM | POA: Diagnosis not present

## 2017-12-08 DIAGNOSIS — Z5181 Encounter for therapeutic drug level monitoring: Secondary | ICD-10-CM | POA: Diagnosis not present

## 2017-12-08 DIAGNOSIS — M059 Rheumatoid arthritis with rheumatoid factor, unspecified: Secondary | ICD-10-CM | POA: Diagnosis not present

## 2017-12-18 ENCOUNTER — Telehealth: Payer: Self-pay | Admitting: Nurse Practitioner

## 2017-12-18 DIAGNOSIS — I951 Orthostatic hypotension: Secondary | ICD-10-CM | POA: Diagnosis not present

## 2017-12-18 DIAGNOSIS — I10 Essential (primary) hypertension: Secondary | ICD-10-CM | POA: Diagnosis not present

## 2017-12-18 DIAGNOSIS — E785 Hyperlipidemia, unspecified: Secondary | ICD-10-CM | POA: Diagnosis not present

## 2017-12-18 DIAGNOSIS — I959 Hypotension, unspecified: Secondary | ICD-10-CM | POA: Diagnosis not present

## 2017-12-18 DIAGNOSIS — E876 Hypokalemia: Secondary | ICD-10-CM | POA: Diagnosis not present

## 2017-12-19 ENCOUNTER — Encounter: Payer: Self-pay | Admitting: Nurse Practitioner

## 2017-12-19 ENCOUNTER — Ambulatory Visit (INDEPENDENT_AMBULATORY_CARE_PROVIDER_SITE_OTHER): Payer: Medicare Other | Admitting: Nurse Practitioner

## 2017-12-19 VITALS — BP 115/73 | HR 87 | Temp 97.1°F | Ht 65.0 in | Wt 171.0 lb

## 2017-12-19 DIAGNOSIS — R05 Cough: Secondary | ICD-10-CM

## 2017-12-19 DIAGNOSIS — E876 Hypokalemia: Secondary | ICD-10-CM

## 2017-12-19 DIAGNOSIS — R059 Cough, unspecified: Secondary | ICD-10-CM

## 2017-12-19 MED ORDER — ALBUTEROL SULFATE HFA 108 (90 BASE) MCG/ACT IN AERS: 2.0000 | INHALATION_SPRAY | Freq: Four times a day (QID) | RESPIRATORY_TRACT | 2 refills | Status: DC | PRN

## 2017-12-19 MED ORDER — AZITHROMYCIN 250 MG PO TABS: ORAL_TABLET | ORAL | 0 refills | Status: DC

## 2017-12-19 MED ORDER — BENZONATATE 100 MG PO CAPS: 100.0000 mg | ORAL_CAPSULE | Freq: Three times a day (TID) | ORAL | 0 refills | Status: DC | PRN

## 2017-12-19 NOTE — Telephone Encounter (Signed)
Appt given

## 2017-12-19 NOTE — Patient Instructions (Signed)

## 2017-12-19 NOTE — Progress Notes (Signed)
   Subjective:    Patient ID: Lisa Ball, female    DOB: [birth year], 57 y.o.   MRN: [id number]  HPI Patient had to go to ER in [place] c/o with dizziness and feeling like she was going to pass out. Her blood pressure was very low. According to her labs her magnesium and potassium were low. They stopped hr lisinopril and gave her magnesium and potasium supplements. She said 1 week prior to that she had gone to see her dermatologist and blood pressure was low then as well. Today she feels tired but otherwise ok.    Review of Systems  Constitutional: Positive for fatigue. Negative for appetite change.  HENT: Positive for congestion and rhinorrhea.   Respiratory: Negative for cough and shortness of breath.   Cardiovascular: Negative for chest pain and leg swelling.  Genitourinary: Negative.   Neurological: Negative for dizziness, syncope, weakness and headaches.  Psychiatric/Behavioral: Negative.   All other systems reviewed and are negative.      Objective:   Physical Exam  Constitutional: She is oriented to person, place, and time. She appears well-developed and well-nourished. She appears distressed (moderate).  HENT:  Right Ear: Hearing, tympanic membrane, external ear and ear canal normal.  Left Ear: Hearing, tympanic membrane, external ear and ear canal normal.  Nose: Mucosal edema and rhinorrhea present. Right sinus exhibits no maxillary sinus tenderness and no frontal sinus tenderness. Left sinus exhibits no maxillary sinus tenderness and no frontal sinus tenderness.  Mouth/Throat: Uvula is midline, oropharynx is clear and moist and mucous membranes are normal.  Neck: Normal range of motion. Neck supple.  Cardiovascular: Normal rate and regular rhythm.  Pulmonary/Chest: She has wheezes (exp wheezes throughout).  Deep cough  Abdominal: Soft. Bowel sounds are normal.  Lymphadenopathy:    She has no cervical adenopathy.  Neurological: She is alert and oriented to person,  place, and time.  Skin: Skin is warm.  Psychiatric: She has a normal mood and affect. Her behavior is normal. Judgment and thought content normal.    BP 115/73   Pulse 87   Temp (!) 97.1 F (36.2 C) (Oral)   Ht '5\' 5"'$  (1.651 m)   Wt 171 lb (77.6 kg)   BMI 28.46 kg/m        Assessment & Plan:  1. Cough Force fluids Rest Run humidifier - azithromycin (ZITHROMAX Z-PAK) 250 MG tablet; As directed  Dispense: 6 tablet; Refill: 0 - benzonatate (TESSALON PERLES) 100 MG capsule; Take 1 capsule (100 mg total) by mouth 3 (three) times daily as needed for cough.  Dispense: 20 capsule; Refill: 0 - albuterol (PROVENTIL HFA;VENTOLIN HFA) 108 (90 Base) MCG/ACT inhaler; Inhale 2 puffs into the lungs every 6 (six) hours as needed for wheezing or shortness of breath.  Dispense: 1 Inhaler; Refill: 2  2. Hypokalemia - CMP14+EGFR  3. Hypomagnesemia Labs pending - Magnesium  RTOprn  Mary-Margaret Hassell Done, FNP

## 2017-12-20 LAB — CMP14+EGFR
ALBUMIN: 3.8 g/dL (ref 3.5–5.5)
ALK PHOS: 60 IU/L (ref 39–117)
ALT: 22 IU/L (ref 0–32)
AST: 21 IU/L (ref 0–40)
Albumin/Globulin Ratio: 1.4 (ref 1.2–2.2)
BILIRUBIN TOTAL: 0.3 mg/dL (ref 0.0–1.2)
BUN / CREAT RATIO: 9 (ref 9–23)
BUN: 9 mg/dL (ref 6–24)
CHLORIDE: 101 mmol/L (ref 96–106)
CO2: 28 mmol/L (ref 20–29)
Calcium: 9.2 mg/dL (ref 8.7–10.2)
Creatinine, Ser: 0.97 mg/dL (ref 0.57–1.00)
GFR calc Af Amer: 76 mL/min/{1.73_m2} (ref >59)
GFR calc non Af Amer: 65 mL/min/{1.73_m2} (ref >59)
Globulin, Total: 2.7 g/dL (ref 1.5–4.5)
Glucose: 125 mg/dL — ABNORMAL HIGH (ref 65–99)
Potassium: 3.2 mmol/L — ABNORMAL LOW (ref 3.5–5.2)
Sodium: 140 mmol/L (ref 134–144)
Total Protein: 6.5 g/dL (ref 6.0–8.5)

## 2017-12-20 LAB — MAGNESIUM: MAGNESIUM: 1.8 mg/dL (ref 1.6–2.3)

## 2017-12-23 ENCOUNTER — Ambulatory Visit (INDEPENDENT_AMBULATORY_CARE_PROVIDER_SITE_OTHER): Payer: Medicare Other | Admitting: Nurse Practitioner

## 2017-12-23 ENCOUNTER — Encounter: Payer: Self-pay | Admitting: Nurse Practitioner

## 2017-12-23 ENCOUNTER — Ambulatory Visit (INDEPENDENT_AMBULATORY_CARE_PROVIDER_SITE_OTHER): Payer: Medicare Other

## 2017-12-23 VITALS — BP 124/81 | HR 66 | Temp 97.4°F | Ht 65.0 in | Wt 169.0 lb

## 2017-12-23 DIAGNOSIS — M8580 Other specified disorders of bone density and structure, unspecified site: Secondary | ICD-10-CM

## 2017-12-23 DIAGNOSIS — F3342 Major depressive disorder, recurrent, in full remission: Secondary | ICD-10-CM

## 2017-12-23 DIAGNOSIS — I10 Essential (primary) hypertension: Secondary | ICD-10-CM

## 2017-12-23 DIAGNOSIS — E782 Mixed hyperlipidemia: Secondary | ICD-10-CM

## 2017-12-23 DIAGNOSIS — F17200 Nicotine dependence, unspecified, uncomplicated: Secondary | ICD-10-CM | POA: Diagnosis not present

## 2017-12-23 DIAGNOSIS — L409 Psoriasis, unspecified: Secondary | ICD-10-CM

## 2017-12-23 DIAGNOSIS — M8588 Other specified disorders of bone density and structure, other site: Secondary | ICD-10-CM

## 2017-12-23 DIAGNOSIS — Z13820 Encounter for screening for osteoporosis: Secondary | ICD-10-CM | POA: Diagnosis not present

## 2017-12-23 DIAGNOSIS — L4050 Arthropathic psoriasis, unspecified: Secondary | ICD-10-CM | POA: Diagnosis not present

## 2017-12-23 DIAGNOSIS — Z6829 Body mass index (BMI) 29.0-29.9, adult: Secondary | ICD-10-CM

## 2017-12-23 DIAGNOSIS — Z780 Asymptomatic menopausal state: Secondary | ICD-10-CM | POA: Diagnosis not present

## 2017-12-23 MED ORDER — FENOFIBRATE 160 MG PO TABS: 160.0000 mg | ORAL_TABLET | Freq: Every day | ORAL | 1 refills | Status: DC

## 2017-12-23 MED ORDER — KLOR-CON M20 20 MEQ PO TBCR: 20.0000 meq | EXTENDED_RELEASE_TABLET | Freq: Two times a day (BID) | ORAL | 5 refills | Status: DC

## 2017-12-23 MED ORDER — ESCITALOPRAM OXALATE 20 MG PO TABS: 20.0000 mg | ORAL_TABLET | Freq: Every day | ORAL | 1 refills | Status: DC

## 2017-12-23 MED ORDER — ATORVASTATIN CALCIUM 40 MG PO TABS: 40.0000 mg | ORAL_TABLET | Freq: Every day | ORAL | 1 refills | Status: DC

## 2017-12-23 NOTE — Patient Instructions (Signed)
Steps to Quit Smoking Smoking tobacco can be bad for your health. It can also affect almost every organ in your body. Smoking puts you and people around you at risk for many serious long-lasting (chronic) diseases. Quitting smoking is hard, but it is one of the best things that you can do for your health. It is never too late to quit. What are the benefits of quitting smoking? When you quit smoking, you lower your risk for getting serious diseases and conditions. They can include:  Lung cancer or lung disease.  Heart disease.  Stroke.  Heart attack.  Not being able to have children (infertility).  Weak bones (osteoporosis) and broken bones (fractures).  If you have coughing, wheezing, and shortness of breath, those symptoms may get better when you quit. You may also get sick less often. If you are pregnant, quitting smoking can help to lower your chances of having a baby of low birth weight. What can I do to help me quit smoking? Talk with your doctor about what can help you quit smoking. Some things you can do (strategies) include:  Quitting smoking totally, instead of slowly cutting back how much you smoke over a period of time.  Going to in-person counseling. You are more likely to quit if you go to many counseling sessions.  Using resources and support systems, such as: ? Online chats with a counselor. ? Phone quitlines. ? Printed self-help materials. ? Support groups or group counseling. ? Text messaging programs. ? Mobile phone apps or applications.  Taking medicines. Some of these medicines may have nicotine in them. If you are pregnant or breastfeeding, do not take any medicines to quit smoking unless your doctor says it is okay. Talk with your doctor about counseling or other things that can help you.  Talk with your doctor about using more than one strategy at the same time, such as taking medicines while you are also going to in-person counseling. This can help make  quitting easier. What things can I do to make it easier to quit? Quitting smoking might feel very hard at first, but there is a lot that you can do to make it easier. Take these steps:  Talk to your family and friends. Ask them to support and encourage you.  Call phone quitlines, reach out to support groups, or work with a counselor.  Ask people who smoke to not smoke around you.  Avoid places that make you want (trigger) to smoke, such as: ? Bars. ? Parties. ? Smoke-break areas at work.  Spend time with people who do not smoke.  Lower the stress in your life. Stress can make you want to smoke. Try these things to help your stress: ? Getting regular exercise. ? Deep-breathing exercises. ? Yoga. ? Meditating. ? Doing a body scan. To do this, close your eyes, focus on one area of your body at a time from head to toe, and notice which parts of your body are tense. Try to relax the muscles in those areas.  Download or buy apps on your mobile phone or tablet that can help you stick to your quit plan. There are many free apps, such as QuitGuide from the CDC (Centers for Disease Control and Prevention). You can find more support from smokefree.gov and other websites.  This information is not intended to replace advice given to you by your health care provider. Make sure you discuss any questions you have with your health care provider. Document Released: 06/29/2009 Document   Revised: 04/30/2016 Document Reviewed: 01/17/2015 Elsevier Interactive Patient Education  2018 Elsevier Inc.  

## 2017-12-23 NOTE — Progress Notes (Signed)
Subjective:    Patient ID: Lisa Ball, female    DOB: 1960/10/04, 57 y.o.   MRN: [id number]  HPI   Lisa Ball is here today for follow up of chronic medical problem.  Outpatient Encounter Medications as of 12/23/2017  Medication Sig  . Adalimumab (HUMIRA [place]) Inject into the skin.  Marland Kitchen albuterol (PROVENTIL HFA;VENTOLIN HFA) 108 (90 Base) MCG/ACT inhaler Inhale 2 puffs into the lungs every 6 (six) hours as needed for wheezing or shortness of breath.  Marland Kitchen atorvastatin (LIPITOR) 40 MG tablet Take 1 tablet (40 mg total) by mouth daily.  . benzonatate (TESSALON PERLES) 100 MG capsule Take 1 capsule (100 mg total) by mouth 3 (three) times daily as needed for cough.  . escitalopram (LEXAPRO) 20 MG tablet Take 1 tablet (20 mg total) by mouth daily.  Marland Kitchen etodolac (LODINE) 400 MG tablet Take 1 tablet (400 mg total) by mouth 2 (two) times daily.  . fenofibrate 160 MG tablet Take 1 tablet (160 mg total) by mouth daily.  Marland Kitchen Flurandrenolide 4 MCG/SQCM TAPE Apply to affected area nightly  . KLOR-CON M20 20 MEQ tablet Take 20 mEq by mouth 2 (two) times daily.   . magnesium oxide (MAG-OX) 400 MG tablet Take 400 mg by mouth daily.     1. Essential hypertension  No c/o chest pain, sob or headache. Does not chec blood pressure at home. BP Readings from Last 3 Encounters:  12/23/17 124/81  12/19/17 115/73  06/24/17 105/76     2. Psoriatic arthritis ([place])  has not seen rheumatology in awhile . She says she has an appointment on May 14,2019.  3. Osteopenia, unspecified location  Do not have last test in computer. Will order.  4. Smoker  Smokes over a pack a day. Says she just cannot quit  5. Mixed hyperlipidemia  Doe sn ot really watch diet very closely  6. Recurrent major depressive disorder, in full remission [facility])  She is on lexapro in which she says works well for her. Depression screen [facility] 2/9 12/23/2017 12/19/2017 06/24/2017  Decreased Interest 0 0 0  Down, Depressed, Hopeless 0 0 0  PHQ - 2  Score 0 0 0     7. BMI 29.0-29.9,adult  No recent weight changes  8.      Psoriasis          Saw dermatology on 12/08/17 and the are starting her on stellara- they actually               referred her to rheumatology    New complaints: none  Social history: Stay at home - lives by herself. Takes careof hr sisters son.   Review of Systems  Constitutional: Negative for activity change and appetite change.  HENT: Negative.   Eyes: Negative for pain.  Respiratory: Negative for shortness of breath.   Cardiovascular: Negative for chest pain, palpitations and leg swelling.  Gastrointestinal: Negative for abdominal pain.  Endocrine: Negative for polydipsia.  Genitourinary: Negative.   Skin: Negative for rash.  Neurological: Negative for dizziness, weakness and headaches.  Hematological: Does not bruise/bleed easily.  Psychiatric/Behavioral: Negative.   All other systems reviewed and are negative.      Objective:   Physical Exam  Constitutional: She is oriented to person, place, and time. She appears well-developed and well-nourished.  HENT:  Nose: Nose normal.  Mouth/Throat: Oropharynx is clear and moist.  Eyes: EOM are normal.  Neck: Trachea normal, normal range of motion and full passive range of  motion without pain. Neck supple. No JVD present. Carotid bruit is not present. No thyromegaly present.  Cardiovascular: Normal rate, regular rhythm, normal heart sounds and intact distal pulses. Exam reveals no gallop and no friction rub.  No murmur heard. Pulmonary/Chest: Effort normal and breath sounds normal.  Abdominal: Soft. Bowel sounds are normal. She exhibits no distension and no mass. There is no tenderness.  Musculoskeletal: Normal range of motion.  Lymphadenopathy:    She has no cervical adenopathy.  Neurological: She is alert and oriented to person, place, and time. She has normal reflexes.  Skin: Skin is warm and dry.  Psychiatric: She has a normal mood and affect.  Her behavior is normal. Judgment and thought content normal.   BP 124/81   Pulse 66   Temp (!) 97.4 F (36.3 C) (Oral)   Ht _0  (1.651 m)   Wt 169 lb (76.7 kg)   BMI 28.12 kg/m      Assessment & Plan:  1. Essential hypertension Low sodium diet - CMP14+EGFR  2. Psoriatic arthritis ([place]) Keep appointment with rheumatologist  3. Osteopenia, unspecified location Weight bearing exercie - DG [facility] DEXA  4. Smoker Encouraged to stop smoking  5. Mixed hyperlipidemia Low fat diet - Lipid panel - atorvastatin (LIPITOR) 40 MG tablet; Take 1 tablet (40 mg total) by mouth daily.  Dispense: 90 tablet; Refill: 1 - fenofibrate 160 MG tablet; Take 1 tablet (160 mg total) by mouth daily.  Dispense: 90 tablet; Refill: 1  6. Recurrent major depressive disorder, in full remission ([place]) Stress management - escitalopram (LEXAPRO) 20 MG tablet; Take 1 tablet (20 mg total) by mouth daily.  Dispense: 90 tablet; Refill: 1  7. BMI 29.0-29.9,adult Discussed diet and exercise for person with BMI >25 Will recheck weight in 3-6 months  8. Psoriasis Keep follow up appointment with dermatologist    Labs pending Health maintenance reviewed Diet and exercise encouraged Continue all meds Follow up  In 6 months   [place], FNP

## 2017-12-24 LAB — CMP14+EGFR
ALBUMIN: 3.7 g/dL (ref 3.5–5.5)
ALK PHOS: 59 IU/L (ref 39–117)
ALT: 35 IU/L — ABNORMAL HIGH (ref 0–32)
AST: 29 IU/L (ref 0–40)
Albumin/Globulin Ratio: 1.1 — ABNORMAL LOW (ref 1.2–2.2)
BILIRUBIN TOTAL: 0.5 mg/dL (ref 0.0–1.2)
BUN / CREAT RATIO: 13 (ref 9–23)
BUN: 9 mg/dL (ref 6–24)
CHLORIDE: 104 mmol/L (ref 96–106)
CO2: 22 mmol/L (ref 20–29)
CREATININE: 0.72 mg/dL (ref 0.57–1.00)
Calcium: 9.7 mg/dL (ref 8.7–10.2)
GFR calc Af Amer: 108 mL/min/{1.73_m2} (ref >59)
GFR calc non Af Amer: 94 mL/min/{1.73_m2} (ref >59)
GLUCOSE: 95 mg/dL (ref 65–99)
Globulin, Total: 3.3 g/dL (ref 1.5–4.5)
Potassium: 3.8 mmol/L (ref 3.5–5.2)
Sodium: 141 mmol/L (ref 134–144)
Total Protein: 7 g/dL (ref 6.0–8.5)

## 2017-12-24 LAB — LIPID PANEL
CHOLESTEROL TOTAL: 93 mg/dL — AB (ref 100–199)
Chol/HDL Ratio: 6.2 ratio — ABNORMAL HIGH (ref 0.0–4.4)
HDL: 15 mg/dL — ABNORMAL LOW (ref >39)
LDL Calculated: 32 mg/dL (ref 0–99)
TRIGLYCERIDES: 230 mg/dL — AB (ref 0–149)
VLDL CHOLESTEROL CAL: 46 mg/dL — AB (ref 5–40)

## 2018-01-13 DIAGNOSIS — Z1231 Encounter for screening mammogram for malignant neoplasm of breast: Secondary | ICD-10-CM | POA: Diagnosis not present

## 2018-01-13 LAB — HM MAMMOGRAPHY

## 2018-01-27 DIAGNOSIS — M25562 Pain in left knee: Secondary | ICD-10-CM | POA: Diagnosis not present

## 2018-01-27 DIAGNOSIS — L409 Psoriasis, unspecified: Secondary | ICD-10-CM | POA: Diagnosis not present

## 2018-01-27 DIAGNOSIS — Z888 Allergy status to other drugs, medicaments and biological substances status: Secondary | ICD-10-CM | POA: Diagnosis not present

## 2018-01-27 DIAGNOSIS — Z79899 Other long term (current) drug therapy: Secondary | ICD-10-CM | POA: Diagnosis not present

## 2018-01-27 DIAGNOSIS — L4050 Arthropathic psoriasis, unspecified: Secondary | ICD-10-CM | POA: Diagnosis not present

## 2018-01-27 DIAGNOSIS — M25462 Effusion, left knee: Secondary | ICD-10-CM | POA: Diagnosis not present

## 2018-01-27 DIAGNOSIS — Z880 Allergy status to penicillin: Secondary | ICD-10-CM | POA: Diagnosis not present

## 2018-04-08 ENCOUNTER — Ambulatory Visit (INDEPENDENT_AMBULATORY_CARE_PROVIDER_SITE_OTHER): Payer: Medicare Other | Admitting: Family Medicine

## 2018-04-08 ENCOUNTER — Encounter: Payer: Self-pay | Admitting: Family Medicine

## 2018-04-08 VITALS — BP 132/84 | HR 79 | Temp 98.5°F | Ht 65.0 in | Wt 167.0 lb

## 2018-04-08 DIAGNOSIS — L4050 Arthropathic psoriasis, unspecified: Secondary | ICD-10-CM | POA: Diagnosis not present

## 2018-04-08 MED ORDER — PREDNISONE 10 MG PO TABS: ORAL_TABLET | ORAL | 0 refills | Status: DC

## 2018-04-08 NOTE — Patient Instructions (Signed)
Great to see you!  Please let your rheumatologist know about the prednisone

## 2018-04-08 NOTE — Progress Notes (Signed)
   HPI  Patient presents today for flare of psoriatic arthritis.  Patient explains she has had bilateral shoulder, knees, wrists, and ankles pain over the last week consistent with previous flares.  She is recently changed from Enbrel to a new injection and is unsure if it is helping as much.  She denies any injuries.   PMH: Smoking status noted ROS: Per HPI  Objective: BP 132/84   Pulse 79   Temp 98.5 F (36.9 C) (Oral)   Ht 5\' 5"  (1.651 m)   Wt 167 lb (75.8 kg)   BMI 27.79 kg/m  Gen: NAD, alert, cooperative with exam HEENT: NCAT CV: RRR, good S1/S2, no murmur Resp: CTABL, no wheezes, non-labored Ext: No edema, warm Neuro: Alert and oriented, No gross deficits  Assessment and plan:  #psoriatic arthritis With current flare Treat with steroids, recommended notifying her rheumatologist and dermatologist   Meds ordered this encounter  Medications  . predniSONE (DELTASONE) 10 MG tablet    Sig: Take 4 pills a day for 3 days, then 3 pills a day for 3 days, then 2 pills a day for 3 days, then 1 pill a day for 3 days, then stop    Dispense:  30 tablet    Refill:  [place], MD [facility] 04/08/2018, 3:27 PM

## 2018-05-07 ENCOUNTER — Ambulatory Visit (INDEPENDENT_AMBULATORY_CARE_PROVIDER_SITE_OTHER): Payer: Medicare Other | Admitting: Pediatrics

## 2018-05-07 ENCOUNTER — Ambulatory Visit: Payer: Medicare Other

## 2018-05-07 ENCOUNTER — Encounter: Payer: Self-pay | Admitting: Pediatrics

## 2018-05-07 VITALS — BP 131/82 | HR 80 | Temp 98.6°F | Ht 65.0 in | Wt 167.0 lb

## 2018-05-07 DIAGNOSIS — M1990 Unspecified osteoarthritis, unspecified site: Secondary | ICD-10-CM

## 2018-05-07 MED ORDER — PREDNISONE 10 MG PO TABS: ORAL_TABLET | ORAL | 0 refills | Status: DC

## 2018-05-07 NOTE — Progress Notes (Signed)
  Subjective:   Patient ID: Lisa Ball, female    DOB: 10-01-1960, 57 y.o.   MRN: [id number] CC: Joint pain (Rheumatoid)  HPI: Lisa Ball is a 57 y.o. female   Has follow up appt upcoming with rheumatology. Past few days has had pain in her ankles b/l, wrists and hands consistent with her previous inflammatory arthritis flares. No fevers. No redness of the joints. Asks for refill of prednisone taper which has helped in the past.  Relevant past medical, surgical, family and social history reviewed. Allergies and medications reviewed and updated. Social History   Tobacco Use  Smoking Status Current Every Day Smoker  . Packs/day: 1.00  . Years: 35.00  . Pack years: 35.00  . Types: Cigarettes  Smokeless Tobacco Never Used   ROS: Per HPI   Objective:    BP 131/82   Pulse 80   Temp 98.6 F (37 C) (Oral)   Ht 5\' 5"  (1.651 m)   Wt 167 lb (75.8 kg)   BMI 27.79 kg/m   Wt Readings from Last 3 Encounters:  05/07/18 167 lb (75.8 kg)  04/08/18 167 lb (75.8 kg)  12/23/17 169 lb (76.7 kg)    Gen: NAD, alert, cooperative with exam, NCAT EYES: EOMI, no conjunctival injection, or no icterus CV: NRRR, normal S1/S2, no murmur, distal pulses 2+ b/l Resp: CTABL, no wheezes, normal WOB Neuro: Alert and oriented MSK: ankles slightly swollen, tenderness with ROM wrists b/l.  Assessment & Plan:  Lisa Ball was seen today for joint pain.  Diagnoses and all orders for this visit:  Inflammatory arthritis Trial of below, needs to f/u with rheumatology. Pt to call to set up appt. -     predniSONE (DELTASONE) 10 MG tablet; Take 4 pills a day for 3 days, then 3 pills a day for 3 days, then 2 pills a day for 3 days, then 1 pill a day for 3 days, then stop   Follow up plan: Return in about 3 months (around 08/07/2018). Assunta Found, MD [place]

## 2018-05-08 ENCOUNTER — Encounter: Payer: Self-pay | Admitting: Pediatrics

## 2018-05-12 DIAGNOSIS — L4050 Arthropathic psoriasis, unspecified: Secondary | ICD-10-CM | POA: Diagnosis not present

## 2018-05-12 DIAGNOSIS — L409 Psoriasis, unspecified: Secondary | ICD-10-CM | POA: Diagnosis not present

## 2018-05-19 DIAGNOSIS — L4050 Arthropathic psoriasis, unspecified: Secondary | ICD-10-CM | POA: Diagnosis not present

## 2018-05-19 DIAGNOSIS — M159 Polyosteoarthritis, unspecified: Secondary | ICD-10-CM | POA: Diagnosis not present

## 2018-05-19 DIAGNOSIS — L409 Psoriasis, unspecified: Secondary | ICD-10-CM | POA: Diagnosis not present

## 2018-05-19 DIAGNOSIS — Z880 Allergy status to penicillin: Secondary | ICD-10-CM | POA: Diagnosis not present

## 2018-05-19 DIAGNOSIS — Z888 Allergy status to other drugs, medicaments and biological substances status: Secondary | ICD-10-CM | POA: Diagnosis not present

## 2018-05-19 DIAGNOSIS — Z79899 Other long term (current) drug therapy: Secondary | ICD-10-CM | POA: Diagnosis not present

## 2018-06-25 DIAGNOSIS — L409 Psoriasis, unspecified: Secondary | ICD-10-CM | POA: Diagnosis not present

## 2018-06-25 DIAGNOSIS — Z79899 Other long term (current) drug therapy: Secondary | ICD-10-CM | POA: Diagnosis not present

## 2018-06-25 DIAGNOSIS — L4050 Arthropathic psoriasis, unspecified: Secondary | ICD-10-CM | POA: Diagnosis not present

## 2018-07-29 ENCOUNTER — Other Ambulatory Visit: Payer: Self-pay | Admitting: Nurse Practitioner

## 2018-07-29 DIAGNOSIS — F3342 Major depressive disorder, recurrent, in full remission: Secondary | ICD-10-CM

## 2018-08-01 ENCOUNTER — Other Ambulatory Visit: Payer: Self-pay | Admitting: Nurse Practitioner

## 2018-08-01 DIAGNOSIS — E782 Mixed hyperlipidemia: Secondary | ICD-10-CM

## 2018-08-05 ENCOUNTER — Telehealth: Payer: Self-pay | Admitting: Nurse Practitioner

## 2018-08-12 ENCOUNTER — Other Ambulatory Visit: Payer: Self-pay | Admitting: Nurse Practitioner

## 2018-08-12 DIAGNOSIS — F3342 Major depressive disorder, recurrent, in full remission: Secondary | ICD-10-CM

## 2018-08-14 ENCOUNTER — Other Ambulatory Visit: Payer: Self-pay | Admitting: Nurse Practitioner

## 2018-08-14 DIAGNOSIS — F3342 Major depressive disorder, recurrent, in full remission: Secondary | ICD-10-CM

## 2018-08-18 NOTE — Addendum Note (Signed)
Addended by: Antonietta Barcelona D on: 08/18/2018 08:48 AM   Modules accepted: Orders

## 2018-08-18 NOTE — Telephone Encounter (Signed)
Failed. Resent. 90 day request. Refused. NTBS

## 2018-08-26 ENCOUNTER — Other Ambulatory Visit: Payer: Self-pay | Admitting: *Deleted

## 2018-08-26 DIAGNOSIS — F3342 Major depressive disorder, recurrent, in full remission: Secondary | ICD-10-CM

## 2018-08-26 NOTE — Telephone Encounter (Signed)
MMM. NTBS 30 days given 07/29/18

## 2018-08-31 ENCOUNTER — Ambulatory Visit (INDEPENDENT_AMBULATORY_CARE_PROVIDER_SITE_OTHER): Payer: Medicare Other | Admitting: Nurse Practitioner

## 2018-08-31 ENCOUNTER — Encounter: Payer: Self-pay | Admitting: Nurse Practitioner

## 2018-08-31 VITALS — BP 152/95 | HR 73 | Temp 96.8°F | Ht 65.0 in | Wt 161.0 lb

## 2018-08-31 DIAGNOSIS — L409 Psoriasis, unspecified: Secondary | ICD-10-CM

## 2018-08-31 DIAGNOSIS — N3946 Mixed incontinence: Secondary | ICD-10-CM

## 2018-08-31 DIAGNOSIS — I10 Essential (primary) hypertension: Secondary | ICD-10-CM | POA: Diagnosis not present

## 2018-08-31 MED ORDER — CLOBETASOL PROPIONATE 0.05 % EX CREA: 1.0000 "application " | TOPICAL_CREAM | Freq: Two times a day (BID) | CUTANEOUS | 0 refills | Status: DC

## 2018-08-31 NOTE — Progress Notes (Signed)
   Subjective:    Patient ID: Lisa Ball, female    DOB: Dec 03, 1960, 57 y.o.   MRN: [id number]   Chief Complaint: ? psoriasis in vaginal area   HPI Patient has a long history of psoriasis. She says she has had burning and itching with vaginal discharge that started 2 weeks ago. She has tried monistat OTC with no relief. She is afraid that she has psoriasis there and that is what is burning and itching. Denies any new sexual partners.  Review of Systems  Respiratory: Negative.   Cardiovascular: Negative.   Genitourinary: Positive for vaginal discharge (yellowish).  Neurological: Negative.   Psychiatric/Behavioral: Negative.   All other systems reviewed and are negative.      Objective:   Physical Exam Constitutional:      Appearance: Normal appearance.  Cardiovascular:     Rate and Rhythm: Normal rate and regular rhythm.  Pulmonary:     Effort: Pulmonary effort is normal.     Breath sounds: Normal breath sounds.  Genitourinary:    Comments: Erythematous excoriated lesions on bil  Labia Urinary leakage Neurological:     Mental Status: She is alert.    BP (!) 152/95   Pulse 73   Temp (!) 96.8 F (36 C) (Oral)   Ht 5\' 5"  (1.651 m)   Wt 161 lb (73 kg)   BMI 26.79 kg/m         Assessment & Plan:  Lisa Ball in today with chief complaint of ? psoriasis in vaginal area   1. Psoriasis of vulva Avoid scratching Avoid hot baths Meds ordered this encounter  Medications  . clobetasol cream (TEMOVATE) 0.05 %    Sig: Apply 1 application topically 2 (two) times daily.    Dispense:  30 g    Refill:  0    Order Specific Question:   Supervising Provider    Answer:   VINCENT, CAROL L [[id number]]     2. Mixed stress and urge urinary incontinence Continue to wear pads- change frequently Patient will let me know when wants to see urology  3. Hypertension, unspecified type Keep diary of blood pressure and rto if remains above 017 systolic Low sodium  diet  Mary-Margaret Hassell Done, FNP .

## 2018-08-31 NOTE — Patient Instructions (Signed)
Psoriasis  Psoriasis is a long-term (chronic) skin condition. It causes raised, red patches (plaques) on your skin that look silvery. The red patches may show up anywhere on your body. They can be any size or shape.  Psoriasis can come and go. It can range from mild to very bad. It cannot be passed from one person to another (not contagious). There is no cure for this condition, but it can be helped with treatment.  Follow these instructions at home:  Skin Care   Apply moisturizers to your skin as needed. Only use those that your doctor has said are okay.   Apply cool compresses to the affected areas.   Do not scratch your skin.  Lifestyle     Do not use tobacco products. This includes cigarettes, chewing tobacco, and e-cigarettes. If you need help quitting, ask your doctor.   Drink little or no alcohol.   Try to lower your stress. Meditation or yoga may help.   Get sun as told by your doctor. Do not get sunburned.   Think about joining a psoriasis support group.  Medicines   Take or use over-the-counter and prescription medicines only as told by your doctor.   If you were prescribed an antibiotic, take or use it as told by your doctor. Do not stop taking the antibiotic even if your condition starts to get better.  General instructions   Keep a journal. Use this to help track what triggers an outbreak. Try to avoid any triggers.   See a counselor or social worker if you feel very sad, upset, or hopeless about your condition and these feelings affect your work or relationships.   Keep all follow-up visits as told by your doctor. This is important.  Contact a doctor if:   Your pain gets worse.   You have more redness or warmth in the affected areas.   You have new pain or stiffness in your joints.   Your pain or stiffness in your joints gets worse.   Your nails start to break easily.   Your nails pull away from the nail bed easily.   You have a fever.   You feel very sad (depressed).  This  information is not intended to replace advice given to you by your health care provider. Make sure you discuss any questions you have with your health care provider.  Document Released: 10/10/2004 Document Revised: 02/08/2016 Document Reviewed: 01/18/2015  Elsevier Interactive Patient Education  2018 [employer]vier Inc[employer]

## 2018-09-06 ENCOUNTER — Other Ambulatory Visit: Payer: Self-pay | Admitting: Nurse Practitioner

## 2018-09-06 DIAGNOSIS — E782 Mixed hyperlipidemia: Secondary | ICD-10-CM

## 2018-09-21 ENCOUNTER — Other Ambulatory Visit: Payer: Self-pay | Admitting: Nurse Practitioner

## 2018-09-21 DIAGNOSIS — E782 Mixed hyperlipidemia: Secondary | ICD-10-CM

## 2018-09-22 MED ORDER — FENOFIBRATE 160 MG PO TABS: 160.0000 mg | ORAL_TABLET | Freq: Every day | ORAL | 0 refills | Status: DC

## 2018-09-22 NOTE — Addendum Note (Signed)
Addended by: Antonietta Barcelona D on: 09/22/2018 11:40 AM   Modules accepted: Orders

## 2018-10-06 ENCOUNTER — Encounter: Payer: Self-pay | Admitting: Nurse Practitioner

## 2018-10-06 ENCOUNTER — Ambulatory Visit (INDEPENDENT_AMBULATORY_CARE_PROVIDER_SITE_OTHER): Payer: Medicare Other | Admitting: Nurse Practitioner

## 2018-10-06 VITALS — BP 173/92 | HR 69 | Temp 96.1°F | Ht 65.0 in | Wt 163.0 lb

## 2018-10-06 DIAGNOSIS — F3342 Major depressive disorder, recurrent, in full remission: Secondary | ICD-10-CM

## 2018-10-06 DIAGNOSIS — I10 Essential (primary) hypertension: Secondary | ICD-10-CM

## 2018-10-06 DIAGNOSIS — Z0000 Encounter for general adult medical examination without abnormal findings: Secondary | ICD-10-CM

## 2018-10-06 DIAGNOSIS — M8580 Other specified disorders of bone density and structure, unspecified site: Secondary | ICD-10-CM

## 2018-10-06 DIAGNOSIS — E782 Mixed hyperlipidemia: Secondary | ICD-10-CM

## 2018-10-06 DIAGNOSIS — L4050 Arthropathic psoriasis, unspecified: Secondary | ICD-10-CM | POA: Diagnosis not present

## 2018-10-06 DIAGNOSIS — Z111 Encounter for screening for respiratory tuberculosis: Secondary | ICD-10-CM | POA: Diagnosis not present

## 2018-10-06 DIAGNOSIS — F17200 Nicotine dependence, unspecified, uncomplicated: Secondary | ICD-10-CM

## 2018-10-06 DIAGNOSIS — Z6829 Body mass index (BMI) 29.0-29.9, adult: Secondary | ICD-10-CM

## 2018-10-06 DIAGNOSIS — Z0001 Encounter for general adult medical examination with abnormal findings: Secondary | ICD-10-CM | POA: Diagnosis not present

## 2018-10-06 MED ORDER — FENOFIBRATE 160 MG PO TABS: 160.0000 mg | ORAL_TABLET | Freq: Every day | ORAL | 0 refills | Status: DC

## 2018-10-06 MED ORDER — ATORVASTATIN CALCIUM 40 MG PO TABS: 40.0000 mg | ORAL_TABLET | Freq: Every day | ORAL | 1 refills | Status: DC

## 2018-10-06 MED ORDER — LISINOPRIL-HYDROCHLOROTHIAZIDE 20-12.5 MG PO TABS: 1.0000 | ORAL_TABLET | Freq: Every day | ORAL | 1 refills | Status: DC

## 2018-10-06 MED ORDER — ESCITALOPRAM OXALATE 20 MG PO TABS: 20.0000 mg | ORAL_TABLET | Freq: Every day | ORAL | 0 refills | Status: DC

## 2018-10-06 NOTE — Progress Notes (Signed)
Subjective:    Patient ID: Lisa Ball, female    DOB: 11/29/1960, 58 y.o.   MRN: [id number]   Chief Complaint: Annual Exam   HPI:  1. Annual physical exam  Had pap 2 years ago and was normal  2. Hypertension, unspecified type  No c/o chest pain, sob or headaches. Does not check blood pressur0e at home. The er several months ago stopped her blood pressure meds so she has not been on anything. BP Readings from Last 3 Encounters:  10/06/18 (!) 173/92  08/31/18 (!) 152/95  05/07/18 131/82     3. Psoriatic arthritis ([place])  See dr. Irwin Brakeman in [place]. Is on injections monthly  4. Osteopenia, unspecified location  Last dexascan was done 12/23/17. t socre was -0.1. she tries to remember to take daily calcium and vitamind supplment  5. Smoker  Smokes over a pack a day  6. Mixed hyperlipidemia  Does not watch diet and does no exercise.  7. Recurrent major depressive disorder, in full remission [facility])  she has been on lexapro for several years and is doing well. Depression screen [facility] 2/9 10/06/2018 08/31/2018 05/07/2018  Decreased Interest 0 0 0  Down, Depressed, Hopeless 0 0 0  PHQ - 2 Score 0 0 0     8. BMI 29.0-29.9,adult  No recent weight chnages    Outpatient Encounter Medications as of 10/06/2018  Medication Sig  . albuterol (PROVENTIL HFA;VENTOLIN HFA) 108 (90 Base) MCG/ACT inhaler Inhale 2 puffs into the lungs every 6 (six) hours as needed for wheezing or shortness of breath.  . BRODALUMAB [place] Inject into the skin.  . clobetasol cream (TEMOVATE) 6.38 % Apply 1 application topically 2 (two) times daily.  Marland Kitchen escitalopram (LEXAPRO) 20 MG tablet TAKE 1 TABLET (20 MG TOTAL) BY MOUTH DAILY. (NEEDS TO BE SEEN BEFORE NEXT REFILL)  . fenofibrate 160 MG tablet Take 1 tablet (160 mg total) by mouth daily. (Needs to be seen before next refill)  . predniSONE (DELTASONE) 5 MG tablet Take 3 tabs by mouth daily for 5 days, then 2 tabs for 5 days, then 1 tab for 5 day.     New  complaints: None today  Social history: Is on disability for arythritis  Review of Systems  Constitutional: Negative for activity change and appetite change.  HENT: Negative.   Eyes: Negative for pain.  Respiratory: Negative for shortness of breath.   Cardiovascular: Negative for chest pain, palpitations and leg swelling.  Gastrointestinal: Negative for abdominal pain.  Endocrine: Negative for polydipsia.  Genitourinary: Negative.   Skin: Negative for rash.  Neurological: Negative for dizziness, weakness and headaches.  Hematological: Does not bruise/bleed easily.  Psychiatric/Behavioral: Negative.   All other systems reviewed and are negative.      Objective:   Physical Exam Vitals signs and nursing note reviewed.  Constitutional:      General: She is not in acute distress.    Appearance: Normal appearance. She is well-developed.  HENT:     Head: Normocephalic.     Nose: Nose normal.  Eyes:     Pupils: Pupils are equal, round, and reactive to light.  Neck:     Musculoskeletal: Normal range of motion and neck supple.     Vascular: No carotid bruit or JVD.  Cardiovascular:     Rate and Rhythm: Normal rate and regular rhythm.     Heart sounds: Normal heart sounds.  Pulmonary:     Effort: Pulmonary effort is normal. No respiratory distress.  Breath sounds: Normal breath sounds. No wheezing or rales.  Chest:     Chest wall: No tenderness.  Abdominal:     General: Bowel sounds are normal. There is no distension or abdominal bruit.     Palpations: Abdomen is soft. There is no hepatomegaly, splenomegaly, mass or pulsatile mass.     Tenderness: There is no abdominal tenderness.  Musculoskeletal: Normal range of motion.  Lymphadenopathy:     Cervical: No cervical adenopathy.  Skin:    General: Skin is warm and dry.  Neurological:     Mental Status: She is alert and oriented to person, place, and time.     Deep Tendon Reflexes: Reflexes are normal and symmetric.    Psychiatric:        Behavior: Behavior normal.        Thought Content: Thought content normal.        Judgment: Judgment normal.    BP (!) 173/92   Pulse 69   Temp (!) 96.1 F (35.6 C) (Oral)   Ht '5\' 5"'  (1.651 m)   Wt 163 lb (73.9 kg)   BMI 27.12 kg/m         Assessment & Plan:  Lisa Ball comes in today with chief complaint of Annual Exam   Diagnosis and orders addressed:  1. Annual physical exam Needs PAP in 1 year  2. Psoriatic arthritis ([place]) Keep follo wup with rheumatology  3. Osteopenia, unspecified location Weight bearing exercises as can tolerate  4. Smoker Smoking cessation encouraged  5. Mixed hyperlipidemia Low fat diet - fenofibrate 160 MG tablet; Take 1 tablet (160 mg total) by mouth daily. (Needs to be seen before next refill)  Dispense: 30 tablet; Refill: 0 - atorvastatin (LIPITOR) 40 MG tablet; Take 1 tablet (40 mg total) by mouth daily.  Dispense: 90 tablet; Refill: 1 - Lipid panel  6. Recurrent major depressive disorder, in full remission ([place]) stres management - escitalopram (LEXAPRO) 20 MG tablet; Take 1 tablet (20 mg total) by mouth daily. (Needs to be seen before next refill)  Dispense: 90 tablet; Refill: 0  7. BMI 29.0-29.9,adult Discussed diet and exercise for person with BMI >25 Will recheck weight in 3-6 months  8. Essential hypertension Back  On lisinopril Keep diary of blood pressure at home. - lisinopril-hydrochlorothiazide (PRINZIDE,ZESTORETIC) 20-12.5 MG tablet; Take 1 tablet by mouth daily.  Dispense: 90 tablet; Refill: 1 - CMP14+EGFR   Labs pending Health Maintenance reviewed Diet and exercise encouraged  Follow up plan: 6 months   Mary-Margaret Hassell Done, FNP

## 2018-10-06 NOTE — Addendum Note (Signed)
Addended by: Karle Plumber on: 10/06/2018 04:08 PM   Modules accepted: Orders

## 2018-10-06 NOTE — Patient Instructions (Signed)
DASH Eating Plan  DASH stands for "Dietary Approaches to Stop Hypertension." The DASH eating plan is a healthy eating plan that has been shown to reduce high blood pressure (hypertension). It may also reduce your risk for type 2 diabetes, heart disease, and stroke. The DASH eating plan may also help with weight loss.  What are tips for following this plan?    General guidelines   Avoid eating more than 2,300 mg (milligrams) of salt (sodium) a day. If you have hypertension, you may need to reduce your sodium intake to 1,500 mg a day.   Limit alcohol intake to no more than 1 drink a day for nonpregnant women and 2 drinks a day for men. One drink equals 12 oz of beer, 5 oz of wine, or 1 oz of hard liquor.   Work with your health care provider to maintain a healthy body weight or to lose weight. Ask what an ideal weight is for you.   Get at least 30 minutes of exercise that causes your heart to beat faster (aerobic exercise) most days of the week. Activities may include walking, swimming, or biking.   Work with your health care provider or diet and nutrition specialist (dietitian) to adjust your eating plan to your individual calorie needs.  Reading food labels     Check food labels for the amount of sodium per serving. Choose foods with less than 5 percent of the Daily Value of sodium. Generally, foods with less than 300 mg of sodium per serving fit into this eating plan.   To find whole grains, look for the word "whole" as the first word in the ingredient list.  Shopping   Buy products labeled as "low-sodium" or "no salt added."   Buy fresh foods. Avoid canned foods and premade or frozen meals.  Cooking   Avoid adding salt when cooking. Use salt-free seasonings or herbs instead of table salt or sea salt. Check with your health care provider or pharmacist before using salt substitutes.   Do not fry foods. Cook foods using healthy methods such as baking, boiling, grilling, and broiling instead.   Cook with  heart-healthy oils, such as olive, canola, soybean, or sunflower oil.  Meal planning   Eat a balanced diet that includes:  ? 5 or more servings of fruits and vegetables each day. At each meal, try to fill half of your plate with fruits and vegetables.  ? Up to 6-8 servings of whole grains each day.  ? Less than 6 oz of lean meat, poultry, or fish each day. A 3-oz serving of meat is about the same size as a deck of cards. One egg equals 1 oz.  ? 2 servings of low-fat dairy each day.  ? A serving of nuts, seeds, or beans 5 times each week.  ? Heart-healthy fats. Healthy fats called Omega-3 fatty acids are found in foods such as flaxseeds and coldwater fish, like sardines, salmon, and mackerel.   Limit how much you eat of the following:  ? Canned or prepackaged foods.  ? Food that is high in trans fat, such as fried foods.  ? Food that is high in saturated fat, such as fatty meat.  ? Sweets, desserts, sugary drinks, and other foods with added sugar.  ? Full-fat dairy products.   Do not salt foods before eating.   Try to eat at least 2 vegetarian meals each week.   Eat more home-cooked food and less restaurant, buffet, and fast food.     When eating at a restaurant, ask that your food be prepared with less salt or no salt, if possible.  What foods are recommended?  The items listed may not be a complete list. Talk with your dietitian about what dietary choices are best for you.  Grains  Whole-grain or whole-wheat bread. Whole-grain or whole-wheat pasta. Brown rice. Oatmeal. Quinoa. Bulgur. Whole-grain and low-sodium cereals. Pita bread. Low-fat, low-sodium crackers. Whole-wheat flour tortillas.  Vegetables  Fresh or frozen vegetables (raw, steamed, roasted, or grilled). Low-sodium or reduced-sodium tomato and vegetable juice. Low-sodium or reduced-sodium tomato sauce and tomato paste. Low-sodium or reduced-sodium canned vegetables.  Fruits  All fresh, dried, or frozen fruit. Canned fruit in natural juice (without  added sugar).  Meat and other protein foods  Skinless chicken or turkey. Ground chicken or turkey. Pork with fat trimmed off. Fish and seafood. Egg whites. Dried beans, peas, or lentils. Unsalted nuts, nut butters, and seeds. Unsalted canned beans. Lean cuts of beef with fat trimmed off. Low-sodium, lean deli meat.  Dairy  Low-fat (1%) or fat-free (skim) milk. Fat-free, low-fat, or reduced-fat cheeses. Nonfat, low-sodium ricotta or cottage cheese. Low-fat or nonfat yogurt. Low-fat, low-sodium cheese.  Fats and oils  Soft margarine without trans fats. Vegetable oil. Low-fat, reduced-fat, or light mayonnaise and salad dressings (reduced-sodium). Canola, safflower, olive, soybean, and sunflower oils. Avocado.  Seasoning and other foods  Herbs. Spices. Seasoning mixes without salt. Unsalted popcorn and pretzels. Fat-free sweets.  What foods are not recommended?  The items listed may not be a complete list. Talk with your dietitian about what dietary choices are best for you.  Grains  Baked goods made with fat, such as croissants, muffins, or some breads. Dry pasta or rice meal packs.  Vegetables  Creamed or fried vegetables. Vegetables in a cheese sauce. Regular canned vegetables (not low-sodium or reduced-sodium). Regular canned tomato sauce and paste (not low-sodium or reduced-sodium). Regular tomato and vegetable juice (not low-sodium or reduced-sodium). Pickles. Olives.  Fruits  Canned fruit in a light or heavy syrup. Fried fruit. Fruit in cream or butter sauce.  Meat and other protein foods  Fatty cuts of meat. Ribs. Fried meat. Bacon. Sausage. Bologna and other processed lunch meats. Salami. Fatback. Hotdogs. Bratwurst. Salted nuts and seeds. Canned beans with added salt. Canned or smoked fish. Whole eggs or egg yolks. Chicken or turkey with skin.  Dairy  Whole or 2% milk, cream, and half-and-half. Whole or full-fat cream cheese. Whole-fat or sweetened yogurt. Full-fat cheese. Nondairy creamers. Whipped toppings.  Processed cheese and cheese spreads.  Fats and oils  Butter. Stick margarine. Lard. Shortening. Ghee. Bacon fat. Tropical oils, such as coconut, palm kernel, or palm oil.  Seasoning and other foods  Salted popcorn and pretzels. Onion salt, garlic salt, seasoned salt, table salt, and sea salt. Worcestershire sauce. Tartar sauce. Barbecue sauce. Teriyaki sauce. Soy sauce, including reduced-sodium. Steak sauce. Canned and packaged gravies. Fish sauce. Oyster sauce. Cocktail sauce. Horseradish that you find on the shelf. Ketchup. Mustard. Meat flavorings and tenderizers. Bouillon cubes. Hot sauce and Tabasco sauce. Premade or packaged marinades. Premade or packaged taco seasonings. Relishes. Regular salad dressings.  Where to find more information:   National Heart, Lung, and Blood Institute: www.nhlbi.nih.gov   American Heart Association: www.heart.org  Summary   The DASH eating plan is a healthy eating plan that has been shown to reduce high blood pressure (hypertension). It may also reduce your risk for type 2 diabetes, heart disease, and stroke.   With the   DASH eating plan, you should limit salt (sodium) intake to 2,300 mg a day. If you have hypertension, you may need to reduce your sodium intake to 1,500 mg a day.   When on the DASH eating plan, aim to eat more fresh fruits and vegetables, whole grains, lean proteins, low-fat dairy, and heart-healthy fats.   Work with your health care provider or diet and nutrition specialist (dietitian) to adjust your eating plan to your individual calorie needs.  This information is not intended to replace advice given to you by your health care provider. Make sure you discuss any questions you have with your health care provider.  Document Released: 08/22/2011 Document Revised: 08/26/2016 Document Reviewed: 08/26/2016  Elsevier Interactive Patient Education  2019 [employer] Inc[employer]

## 2018-10-07 LAB — CMP14+EGFR
ALT: 11 IU/L (ref 0–32)
AST: 13 IU/L (ref 0–40)
Albumin/Globulin Ratio: 1 — ABNORMAL LOW (ref 1.2–2.2)
Albumin: 4 g/dL (ref 3.8–4.9)
Alkaline Phosphatase: 94 IU/L (ref 39–117)
BUN / CREAT RATIO: 17 (ref 9–23)
BUN: 14 mg/dL (ref 6–24)
Bilirubin Total: 0.2 mg/dL (ref 0.0–1.2)
CO2: 23 mmol/L (ref 20–29)
Calcium: 10.2 mg/dL (ref 8.7–10.2)
Chloride: 101 mmol/L (ref 96–106)
Creatinine, Ser: 0.84 mg/dL (ref 0.57–1.00)
GFR calc non Af Amer: 77 mL/min/{1.73_m2} (ref >59)
GFR, EST AFRICAN AMERICAN: 89 mL/min/{1.73_m2} (ref >59)
Globulin, Total: 3.9 g/dL (ref 1.5–4.5)
Glucose: 144 mg/dL — ABNORMAL HIGH (ref 65–99)
Potassium: 4.8 mmol/L (ref 3.5–5.2)
Sodium: 141 mmol/L (ref 134–144)
Total Protein: 7.9 g/dL (ref 6.0–8.5)

## 2018-10-07 LAB — LIPID PANEL
Chol/HDL Ratio: 6.1 ratio — ABNORMAL HIGH (ref 0.0–4.4)
Cholesterol, Total: 172 mg/dL (ref 100–199)
HDL: 28 mg/dL — ABNORMAL LOW (ref >39)
LDL Calculated: 91 mg/dL (ref 0–99)
Triglycerides: 265 mg/dL — ABNORMAL HIGH (ref 0–149)
VLDL Cholesterol Cal: 53 mg/dL — ABNORMAL HIGH (ref 5–40)

## 2018-10-09 LAB — TB SKIN TEST
Induration: 0 mm
TB Skin Test: NEGATIVE

## 2018-10-12 DIAGNOSIS — Z79899 Other long term (current) drug therapy: Secondary | ICD-10-CM | POA: Diagnosis not present

## 2018-10-12 DIAGNOSIS — L4050 Arthropathic psoriasis, unspecified: Secondary | ICD-10-CM | POA: Diagnosis not present

## 2018-10-12 DIAGNOSIS — L409 Psoriasis, unspecified: Secondary | ICD-10-CM | POA: Diagnosis not present

## 2018-10-22 ENCOUNTER — Encounter: Payer: Self-pay | Admitting: Family Medicine

## 2018-10-22 ENCOUNTER — Ambulatory Visit: Payer: Medicare Other

## 2018-10-22 ENCOUNTER — Ambulatory Visit (INDEPENDENT_AMBULATORY_CARE_PROVIDER_SITE_OTHER): Payer: Medicare Other | Admitting: Family Medicine

## 2018-10-22 VITALS — BP 125/81 | HR 75 | Temp 97.3°F | Ht 65.0 in | Wt 163.8 lb

## 2018-10-22 DIAGNOSIS — L4050 Arthropathic psoriasis, unspecified: Secondary | ICD-10-CM

## 2018-10-22 MED ORDER — PREDNISONE 20 MG PO TABS: ORAL_TABLET | ORAL | 0 refills | Status: DC

## 2018-10-22 NOTE — Progress Notes (Signed)
BP 125/81   Pulse 75   Temp (!) 97.3 F (36.3 C) (Oral)   Ht 5\' 5"  (1.651 m)   Wt 163 lb 12.8 oz (74.3 kg)   BMI 27.26 kg/m    Subjective:    Patient ID: Lisa Ball, female    DOB: 09/26/1960, 58 y.o.   MRN: [id number]  HPI: Lisa Ball is a 58 y.o. female presenting on 10/22/2018 for Joint Swelling (Left shoulder and bilateral wrists x 1 day)   HPI Left shoulder and bilateral wrist swelling and pain. Patient comes in complaining of left shoulder and bilateral wrist pain and swelling that is what comes up when her rheumatoid arthritis flares up.  She says she has had this multiple times before.  When she gets like this usually gets a short course of prednisone and that usually calms it down and then she goes from there.  She is currently taking an amino suppressant brodalumab..  Patient says she gets like this every now and then.  She denies any weakness or loss of range of motion but just says the swelling and pain has started to come up over the past day.  She says it is especially worse because her spouse has been more ill and she is having to lift him more because he is weaker while he is ill.  Relevant past medical, surgical, family and social history reviewed and updated as indicated. Interim medical history since our last visit reviewed. Allergies and medications reviewed and updated.  Review of Systems  Constitutional: Negative for chills and fever.  Eyes: Negative for visual disturbance.  Respiratory: Negative for chest tightness and shortness of breath.   Cardiovascular: Negative for chest pain and leg swelling.  Musculoskeletal: Positive for arthralgias and joint swelling. Negative for back pain and gait problem.  Skin: Negative for rash.  Neurological: Negative for light-headedness and headaches.  Psychiatric/Behavioral: Negative for agitation and behavioral problems.  All other systems reviewed and are negative.   Per HPI unless specifically indicated  above   Allergies as of 10/22/2018      Reactions   Methotrexate Derivatives Shortness Of Breath   Penicillins Other (See Comments)   Couldn't  move      Medication List       Accurate as of October 22, 2018  2:31 PM. Always use your most recent med list.        albuterol 108 (90 Base) MCG/ACT inhaler Commonly known as:  PROVENTIL HFA;VENTOLIN HFA Inhale 2 puffs into the lungs every 6 (six) hours as needed for wheezing or shortness of breath.   atorvastatin 40 MG tablet Commonly known as:  LIPITOR Take 1 tablet (40 mg total) by mouth daily.   BRODALUMAB  Inject into the skin.   clobetasol cream 0.05 % Commonly known as:  TEMOVATE Apply 1 application topically 2 (two) times daily.   escitalopram 20 MG tablet Commonly known as:  LEXAPRO Take 1 tablet (20 mg total) by mouth daily. (Needs to be seen before next refill)   fenofibrate 160 MG tablet Take 1 tablet (160 mg total) by mouth daily. (Needs to be seen before next refill)   lisinopril-hydrochlorothiazide 20-12.5 MG tablet Commonly known as:  PRINZIDE,ZESTORETIC Take 1 tablet by mouth daily.   predniSONE 5 MG tablet Commonly known as:  DELTASONE Take 3 tabs by mouth daily for 5 days, then 2 tabs for 5 days, then 1 tab for 5 day.   predniSONE 20 MG tablet Commonly known as:  DELTASONE 2 po at same time daily for 5 days          Objective:    BP 125/81   Pulse 75   Temp (!) 97.3 F (36.3 C) (Oral)   Ht 5\' 5"  (1.651 m)   Wt 163 lb 12.8 oz (74.3 kg)   BMI 27.26 kg/m   Wt Readings from Last 3 Encounters:  10/22/18 163 lb 12.8 oz (74.3 kg)  10/06/18 163 lb (73.9 kg)  08/31/18 161 lb (73 kg)    Physical Exam Vitals signs and nursing note reviewed.  Constitutional:      General: She is not in acute distress.    Appearance: She is well-developed. She is not diaphoretic.  Eyes:     Conjunctiva/sclera: Conjunctivae normal.  Musculoskeletal: Normal range of motion.     Right wrist: She exhibits  tenderness (Slight tenderness in the wrist, no noted swelling). She exhibits normal range of motion, no crepitus and no laceration.     Left wrist: She exhibits tenderness (Slight tenderness in the wrists but no noted swelling or loss of range of motion). She exhibits normal range of motion.  Skin:    General: Skin is warm and dry.     Findings: No rash.  Neurological:     Mental Status: She is alert and oriented to person, place, and time.     Coordination: Coordination normal.  Psychiatric:        Behavior: Behavior normal.         Assessment & Plan:   Problem List Items Addressed This Visit      Musculoskeletal and Integument   Psoriatic arthritis ([place]) - Primary   Relevant Medications   predniSONE (DELTASONE) 20 MG tablet      Will do short course of prednisone to treat her arthritis and calm it down and follow-up as needed Follow up plan: Return if symptoms worsen or fail to improve.  Counseling provided for all of the vaccine components No orders of the defined types were placed in this encounter.   Caryl Pina, MD [facility] 10/22/2018, 2:31 PM

## 2018-10-27 DIAGNOSIS — M1990 Unspecified osteoarthritis, unspecified site: Secondary | ICD-10-CM | POA: Diagnosis not present

## 2018-10-27 DIAGNOSIS — M159 Polyosteoarthritis, unspecified: Secondary | ICD-10-CM | POA: Insufficient documentation

## 2018-10-27 DIAGNOSIS — R739 Hyperglycemia, unspecified: Secondary | ICD-10-CM | POA: Diagnosis not present

## 2018-10-27 DIAGNOSIS — Z79899 Other long term (current) drug therapy: Secondary | ICD-10-CM | POA: Diagnosis not present

## 2018-10-27 DIAGNOSIS — L4050 Arthropathic psoriasis, unspecified: Secondary | ICD-10-CM | POA: Diagnosis not present

## 2018-10-27 DIAGNOSIS — L409 Psoriasis, unspecified: Secondary | ICD-10-CM | POA: Diagnosis not present

## 2019-01-11 ENCOUNTER — Encounter: Payer: Self-pay | Admitting: Nurse Practitioner

## 2019-01-11 ENCOUNTER — Ambulatory Visit (INDEPENDENT_AMBULATORY_CARE_PROVIDER_SITE_OTHER): Payer: Medicare Other | Admitting: Nurse Practitioner

## 2019-01-11 ENCOUNTER — Other Ambulatory Visit: Payer: Self-pay

## 2019-01-11 DIAGNOSIS — M8580 Other specified disorders of bone density and structure, unspecified site: Secondary | ICD-10-CM

## 2019-01-11 DIAGNOSIS — F3342 Major depressive disorder, recurrent, in full remission: Secondary | ICD-10-CM | POA: Diagnosis not present

## 2019-01-11 DIAGNOSIS — F17200 Nicotine dependence, unspecified, uncomplicated: Secondary | ICD-10-CM

## 2019-01-11 DIAGNOSIS — E782 Mixed hyperlipidemia: Secondary | ICD-10-CM

## 2019-01-11 DIAGNOSIS — I10 Essential (primary) hypertension: Secondary | ICD-10-CM | POA: Diagnosis not present

## 2019-01-11 DIAGNOSIS — Z6829 Body mass index (BMI) 29.0-29.9, adult: Secondary | ICD-10-CM

## 2019-01-11 MED ORDER — ATORVASTATIN CALCIUM 40 MG PO TABS: 40.0000 mg | ORAL_TABLET | Freq: Every day | ORAL | 1 refills | Status: DC

## 2019-01-11 MED ORDER — ESCITALOPRAM OXALATE 20 MG PO TABS: 20.0000 mg | ORAL_TABLET | Freq: Every day | ORAL | 1 refills | Status: DC

## 2019-01-11 MED ORDER — FENOFIBRATE 160 MG PO TABS: 160.0000 mg | ORAL_TABLET | Freq: Every day | ORAL | 1 refills | Status: DC

## 2019-01-11 MED ORDER — LISINOPRIL-HYDROCHLOROTHIAZIDE 20-12.5 MG PO TABS: 1.0000 | ORAL_TABLET | Freq: Every day | ORAL | 1 refills | Status: DC

## 2019-01-11 NOTE — Progress Notes (Signed)
Patient ID: Lisa Ball, female   DOB: 10-Dec-1960, 58 y.o.   MRN: [id number]     Virtual Visit via telephone Note  I connected with Lisa Ball on 01/11/19 at 4:00 PM by telephone and verified that I am speaking with the correct person using two identifiers. Lisa Ball is currently located at home and non one is currently with her during visit. The provider, Lisa Hassell Done, FNP is located in their office at time of visit.  I discussed the limitations, risks, security and privacy concerns of performing an evaluation and management service by telephone and the availability of in person appointments. I also discussed with the patient that there may be a patient responsible charge related to this service. The patient expressed understanding and agreed to proceed.   History and Present Illness:   Chief Complaint: medical management of chronic issues   HPI:  1. Hypertension, unspecified type No c/o chest pain, sob or headache. Does not check blood pressure at home. BP Readings from Last 3 Encounters:  10/22/18 125/81  10/06/18 (!) 173/92  08/31/18 (!) 152/95     2. Mixed hyperlipidemia Not watching diet and no exercise.  3. Recurrent major depressive disorder, in full remission ([place]) Still on lexapro and is doing well. No side effects from medication Depression screen [facility] 2/9 01/11/2019 10/22/2018 10/06/2018  Decreased Interest 0 0 0  Down, Depressed, Hopeless 0 0 0  PHQ - 2 Score 0 0 0     4. Osteopenia, unspecified location Last dexascan was 12/23/17 with t score of -0.1. is not on calcium or vitamin D.  5. Smoker smokes about 1 packa day. Has no desire to quit.  6. BMI 29.0-29.9,adult No recent weight changes.    Outpatient Encounter Medications as of 01/11/2019  Medication Sig   albuterol (PROVENTIL HFA;VENTOLIN HFA) 108 (90 Base) MCG/ACT inhaler Inhale 2 puffs into the lungs every 6 (six) hours as needed for wheezing or shortness of breath.    atorvastatin (LIPITOR) 40 MG tablet Take 1 tablet (40 mg total) by mouth daily.   BRODALUMAB [place] Inject into the skin.   clobetasol cream (TEMOVATE) 1.74 % Apply 1 application topically 2 (two) times daily.   escitalopram (LEXAPRO) 20 MG tablet Take 1 tablet (20 mg total) by mouth daily. (Needs to be seen before next refill)   fenofibrate 160 MG tablet Take 1 tablet (160 mg total) by mouth daily. (Needs to be seen before next refill)   lisinopril-hydrochlorothiazide (PRINZIDE,ZESTORETIC) 20-12.5 MG tablet Take 1 tablet by mouth daily.   predniSONE (DELTASONE) 20 MG tablet 2 po at same time daily for 5 days   predniSONE (DELTASONE) 5 MG tablet Take 3 tabs by mouth daily for 5 days, then 2 tabs for 5 days, then 1 tab for 5 day.   No facility-administered encounter medications on file as of 01/11/2019.       New complaints: No complaints.  Social history: Lives with her boyfriend       Review of Systems  Constitutional: Negative for diaphoresis and weight loss.  Eyes: Negative for blurred vision, double vision and pain.  Respiratory: Negative for shortness of breath.   Cardiovascular: Negative for chest pain, palpitations, orthopnea and leg swelling.  Gastrointestinal: Negative for abdominal pain.  Skin: Negative for rash.  Neurological: Negative for dizziness, sensory change, loss of consciousness, weakness and headaches.  Endo/Heme/Allergies: Negative for polydipsia. Does not bruise/bleed easily.  Psychiatric/Behavioral: Negative for memory loss. The patient does not have insomnia.  All other systems reviewed and are negative.    Observations/Objective: Alert and oriented- answers all questions appropriately No distress  Assessment and Plan: Lisa Ball comes in today with chief complaint of Medical Management of Chronic Issues   Diagnosis and orders addressed:  1. Essential hypertension Low sodium diet - lisinopril-hydrochlorothiazide (ZESTORETIC) 20-12.5  MG tablet; Take 1 tablet by mouth daily.  Dispense: 90 tablet; Refill: 1  2. Mixed hyperlipidemia Low fat diet encouraged Daily exercise - atorvastatin (LIPITOR) 40 MG tablet; Take 1 tablet (40 mg total) by mouth daily.  Dispense: 90 tablet; Refill: 1 - fenofibrate 160 MG tablet; Take 1 tablet (160 mg total) by mouth daily. (Needs to be seen before next refill)  Dispense: 90 tablet; Refill: 1  3. Recurrent major depressive disorder, in full remission ([place]) Stress management - escitalopram (LEXAPRO) 20 MG tablet; Take 1 tablet (20 mg total) by mouth daily. (Needs to be seen before next refill)  Dispense: 90 tablet; Refill: 1  4. Osteopenia, unspecified location Weight bearing exercise Take daily vitamin d and calcium supplement  5. Smoker Smoking cessation encouraged  6. BMI 29.0-29.9,adult Discussed diet and exercise for person with BMI >25 Will recheck weight in 3-6 months   Previous lab results reviewed Health Maintenance reviewed Diet and exercise encouraged  Follow Up Instructions: 3 months    I discussed the assessment and treatment plan with the patient. The patient was provided an opportunity to ask questions and all were answered. The patient agreed with the plan and demonstrated an understanding of the instructions.   The patient was advised to call back or seek an in-person evaluation if the symptoms worsen or if the condition fails to improve as anticipated.  The above assessment and management plan was discussed with the patient. The patient verbalized understanding of and has agreed to the management plan. Patient is aware to call the clinic if symptoms persist or worsen. Patient is aware when to return to the clinic for a follow-up visit. Patient educated on when it is appropriate to go to the emergency department.    I provided 12 minutes of non-face-to-face time during this encounter.    Lisa Hassell Done, FNP

## 2019-01-13 ENCOUNTER — Other Ambulatory Visit: Payer: Self-pay

## 2019-01-13 NOTE — Patient Outreach (Signed)
[facility]) Care Management  01/13/2019  Jalah Warmuth Shvartsman 1960/12/06 [id number]   Medication Adherence call to Mrs. Lattie Haw [place] patient did not answer patient is due on Lisinopril/Hctz 20/12.5 mg and Atorvastatin 40 mg under [place].   [facility] Direct Dial [phone]  Fax [phone] [email or web address]

## 2019-02-23 DIAGNOSIS — L4050 Arthropathic psoriasis, unspecified: Secondary | ICD-10-CM | POA: Diagnosis not present

## 2019-02-23 DIAGNOSIS — L409 Psoriasis, unspecified: Secondary | ICD-10-CM | POA: Diagnosis not present

## 2019-02-23 DIAGNOSIS — Z79899 Other long term (current) drug therapy: Secondary | ICD-10-CM | POA: Diagnosis not present

## 2019-02-23 DIAGNOSIS — M159 Polyosteoarthritis, unspecified: Secondary | ICD-10-CM | POA: Diagnosis not present

## 2019-03-01 ENCOUNTER — Ambulatory Visit (INDEPENDENT_AMBULATORY_CARE_PROVIDER_SITE_OTHER): Payer: Medicare Other | Admitting: *Deleted

## 2019-03-01 ENCOUNTER — Other Ambulatory Visit: Payer: Self-pay

## 2019-03-01 DIAGNOSIS — Z0000 Encounter for general adult medical examination without abnormal findings: Secondary | ICD-10-CM | POA: Diagnosis not present

## 2019-03-01 NOTE — Progress Notes (Signed)
MEDICARE ANNUAL WELLNESS VISIT  03/01/2019  Telephone Visit Disclaimer This Medicare AWV was conducted by telephone due to national recommendations for restrictions regarding the COVID-19 Pandemic (e.g. social distancing).  I verified, using two identifiers, that I am speaking with Lisa Ball or their authorized healthcare agent. I discussed the limitations, risks, security, and privacy concerns of performing an evaluation and management service by telephone and the potential availability of an in-person appointment in the future. The patient expressed understanding and agreed to proceed.   Subjective:  Lisa Ball is a 58 y.o. female patient of Chevis Pretty, [place] who had a Medicare Annual Wellness Visit today via telephone. Lisa Ball is Legally disabled and lives with their family. she has 1 child. she reports that she is socially active and does interact with friends/family regularly. she is not physically active and enjoys watching TV.  Patient Care Team: Chevis Pretty, FNP as PCP - General (Nurse Practitioner)  Advanced Directives 03/01/2019  Does Patient Have a Medical Advance Directive? No  Would patient like information on creating a medical advance directive? No - Patient declined    Hospital Utilization Over the Past 12 Months: # of hospitalizations or ER visits: 0 # of surgeries: 0  Review of Systems    Patient reports that her overall health is better compared to last year.  Patient Reported Readings (BP, Pulse, CBG, Weight, etc) none  Review of Systems: No complaints  All other systems negative.  Pain Assessment Pain : No/denies pain     Current Medications & Allergies (verified) Allergies as of 03/01/2019      Reactions   Methotrexate Derivatives Shortness Of Breath   Penicillins Other (See Comments)   Couldn't  move      Medication List       Accurate as of March 01, 2019 10:01 AM. If you have any questions, ask your nurse or  doctor.        STOP taking these medications   BRODALUMAB [place]   predniSONE 20 MG tablet Commonly known as: DELTASONE   predniSONE 5 MG tablet Commonly known as: DELTASONE     TAKE these medications   albuterol 108 (90 Base) MCG/ACT inhaler Commonly known as: VENTOLIN HFA Inhale 2 puffs into the lungs every 6 (six) hours as needed for wheezing or shortness of breath.   atorvastatin 40 MG tablet Commonly known as: Lipitor Take 1 tablet (40 mg total) by mouth daily.   clobetasol cream 0.05 % Commonly known as: TEMOVATE Apply 1 application topically 2 (two) times daily.   escitalopram 20 MG tablet Commonly known as: LEXAPRO Take 1 tablet (20 mg total) by mouth daily. (Needs to be seen before next refill)   fenofibrate 160 MG tablet Take 1 tablet (160 mg total) by mouth daily. (Needs to be seen before next refill)   lisinopril-hydrochlorothiazide 20-12.5 MG tablet Commonly known as: ZESTORETIC Take 1 tablet by mouth daily.   meloxicam 15 MG tablet Commonly known as: MOBIC Take 15 mg by mouth daily.   Tofacitinib Citrate 5 MG Tabs Take by mouth.       History (reviewed): Past Medical History:  Diagnosis Date  . Anxiety   . Colon polyps   . Depression   . Hyperlipidemia   . Hypertension   . Psoriatic arthritis ([place])   . Pulmonary fibrosis ([place])   . RA (rheumatoid arthritis) ([place])    Past Surgical History:  Procedure Laterality Date  . CHOLECYSTECTOMY    . TUBAL  LIGATION     Family History  Problem Relation Age of Onset  . Heart disease Mother   . Diabetes Sister   . Cancer Father 53       lung cancer-died at age 90  . Colon cancer Neg Hx   . Esophageal cancer Neg Hx   . Rectal cancer Neg Hx   . Stomach cancer Neg Hx    Social History   Socioeconomic History  . Marital status: Widowed    Spouse name: Not on file  . Number of children: 1  . Years of education: 63  . Highest education level: 9th grade  Occupational History  . Occupation:  disability  Social Needs  . Financial resource strain: Not hard at all  . Food insecurity    Worry: Never true    Inability: Never true  . Transportation needs    Medical: No    Non-medical: No  Tobacco Use  . Smoking status: Current Every Day Smoker    Packs/day: 1.00    Years: 35.00    Pack years: 35.00    Types: Cigarettes  . Smokeless tobacco: Never Used  Substance and Sexual Activity  . Alcohol use: No  . Drug use: No  . Sexual activity: Not Currently    Birth control/protection: Surgical  Lifestyle  . Physical activity    Days per week: 0 days    Minutes per session: 0 min  . Stress: Not at all  Relationships  . Social connections    Talks on phone: More than three times a week    Gets together: More than three times a week    Attends religious service: Never    Active member of club or organization: No    Attends meetings of clubs or organizations: Never    Relationship status: Widowed  Other Topics Concern  . Not on file  Social History Narrative  . Not on file    Activities of Daily Living In your present state of health, do you have any difficulty performing the following activities: 03/01/2019  Hearing? N  Vision? N  Difficulty concentrating or making decisions? N  Walking or climbing stairs? N  Dressing or bathing? N  Doing errands, shopping? N  Preparing Food and eating ? N  Using the Toilet? N  In the past six months, have you accidently leaked urine? Y  Comment wears a pad when she goes out just in case she can't find a bathroom in time  Do you have problems with loss of bowel control? N  Managing your Medications? N  Managing your Finances? N  Housekeeping or managing your Housekeeping? N  Some recent data might be hidden    Patient Literacy How often do you need to have someone help you when you read instructions, pamphlets, or other written materials from your doctor or pharmacy?: 1 - Never What is the last grade level you completed in  school?: 9th grade  Exercise Current Exercise Habits: The patient does not participate in regular exercise at present, Exercise limited by: Other - see comments(RA and psoriatic arthritis)  Diet Patient reports consuming 2 meals a day and 1 snack(s) a day Patient reports that her primary diet is: Regular Patient reports that she does have regular access to food.   Depression Screen PHQ 2/9 Scores 03/01/2019 01/11/2019 10/22/2018 10/06/2018 08/31/2018 05/07/2018 04/08/2018  PHQ - 2 Score 0 0 0 0 0 0 0     Fall Risk Fall Risk  03/01/2019 08/31/2018  05/07/2018 12/23/2017 12/19/2017  Falls in the past year? 0 0 No No No     Objective:  Lisa Ball seemed alert and oriented and she participated appropriately during our telephone visit.  Blood Pressure Weight BMI  BP Readings from Last 3 Encounters:  10/22/18 125/81  10/06/18 (!) 173/92  08/31/18 (!) 152/95   Wt Readings from Last 3 Encounters:  10/22/18 163 lb 12.8 oz (74.3 kg)  10/06/18 163 lb (73.9 kg)  08/31/18 161 lb (73 kg)   BMI Readings from Last 1 Encounters:  10/22/18 27.26 kg/m    *Unable to obtain current vital signs, weight, and BMI due to telephone visit type  Hearing/Vision  . Lisa Ball did not seem to have difficulty with hearing/understanding during the telephone conversation . Reports that she has not had a formal eye exam by an eye care professional within the past year . Reports that she has not had a formal hearing evaluation within the past year *Unable to fully assess hearing and vision during telephone visit type  Cognitive Function: 6CIT Screen 03/01/2019  What Year? 0 points  What month? 0 points  What time? 0 points  Count back from 20 0 points  Months in reverse 0 points  Repeat phrase 2 points  Total Score 2   (Normal:0-7, Significant for Dysfunction: >8)  Normal Cognitive Function Screening: Yes   Immunization & Health Maintenance Record Immunization History  Administered Date(s) Administered  .  Influenza,inj,Quad PF,6+ Mos 07/24/2011  . Influenza-Unspecified 05/19/2014  . PPD Test 09/23/2012, 03/30/2014, 03/27/2015, 03/28/2016, 03/25/2017, 10/06/2018  . Pneumococcal Polysaccharide-23 10/16/2004  . Tdap 01/31/2011    Health Maintenance  Topic Date Due  . HIV Screening  01/09/1976  . INFLUENZA VACCINE  04/17/2019  . MAMMOGRAM  01/16/2020  . PAP SMEAR-Modifier  06/24/2020  . TETANUS/TDAP  01/30/2021  . COLONOSCOPY  02/05/2022  . Hepatitis C Screening  Completed       Assessment  This is a routine wellness examination for [employer].  Health Maintenance: Due or Overdue Health Maintenance Due  Topic Date Due  . HIV Screening  01/09/1976    Lisa Ball does not need a referral for Community Assistance: Care Management:   no Social Work:    no Prescription Assistance:  no Nutrition/Diabetes Education:  no   Plan:  Personalized Goals Goals Addressed            This Visit's Progress   . DIET - INCREASE WATER INTAKE        Personalized Health Maintenance & Screening Recommendations  Shingles vaccine   Lung Cancer Screening Recommended: yes-pt declined at this time (Low Dose CT Chest recommended if Age 37-80 years, 30 pack-year currently smoking OR have quit w/in past 15 years) Hepatitis C Screening recommended: no HIV Screening recommended: yes  Advanced Directives: Written information was not prepared per patient's request.  Referrals & Orders No orders of the defined types were placed in this encounter.   Follow-up Plan . Follow-up with Chevis Pretty, FNP as planned . Discuss Shingles vaccine with you Rheumatologist at your next visit to see if they recommend it    I have personally reviewed and noted the following in the patient's chart:   . Medical and social history . Use of alcohol, tobacco or illicit drugs  . Current medications and supplements . Functional ability and status . Nutritional status . Physical activity .  Advanced directives . List of other physicians . Hospitalizations, surgeries, and ER visits in  previous 12 months . Vitals . Screenings to include cognitive, depression, and falls . Referrals and appointments  In addition, I have reviewed and discussed with Lisa Ball certain preventive protocols, quality metrics, and best practice recommendations. A written personalized care plan for preventive services as well as general preventive health recommendations is available and can be mailed to the patient at her request.      Marylin Crosby, LPN  [id number]

## 2019-03-01 NOTE — Patient Instructions (Signed)
Preventive Care 40-64 Years, Female Preventive care refers to lifestyle choices and visits with your health care provider that can promote health and wellness. What does preventive care include?   A yearly physical exam. This is also called an annual well check.  Dental exams once or twice a year.  Routine eye exams. Ask your health care provider how often you should have your eyes checked.  Personal lifestyle choices, including: ? Daily care of your teeth and gums. ? Regular physical activity. ? Eating a healthy diet. ? Avoiding tobacco and drug use. ? Limiting alcohol use. ? Practicing safe sex. ? Taking low-dose aspirin daily starting at age 50. ? Taking vitamin and mineral supplements as recommended by your health care provider. What happens during an annual well check? The services and screenings done by your health care provider during your annual well check will depend on your age, overall health, lifestyle risk factors, and family history of disease. Counseling Your health care provider may ask you questions about your:  Alcohol use.  Tobacco use.  Drug use.  Emotional well-being.  Home and relationship well-being.  Sexual activity.  Eating habits.  Work and work environment.  Method of birth control.  Menstrual cycle.  Pregnancy history. Screening You may have the following tests or measurements:  Height, weight, and BMI.  Blood pressure.  Lipid and cholesterol levels. These may be checked every 5 years, or more frequently if you are over 50 years old.  Skin check.  Lung cancer screening. You may have this screening every year starting at age 55 if you have a 30-pack-year history of smoking and currently smoke or have quit within the past 15 years.  Colorectal cancer screening. All adults should have this screening starting at age 50 and continuing until age 75. Your health care provider may recommend screening at age 45. You will have tests every  1-10 years, depending on your results and the type of screening test. People at increased risk should start screening at an earlier age. Screening tests may include: ? Guaiac-based fecal occult blood testing. ? Fecal immunochemical test (FIT). ? Stool DNA test. ? Virtual colonoscopy. ? Sigmoidoscopy. During this test, a flexible tube with a tiny camera (sigmoidoscope) is used to examine your rectum and lower colon. The sigmoidoscope is inserted through your anus into your rectum and lower colon. ? Colonoscopy. During this test, a long, thin, flexible tube with a tiny camera (colonoscope) is used to examine your entire colon and rectum.  Hepatitis C blood test.  Hepatitis B blood test.  Sexually transmitted disease (STD) testing.  Diabetes screening. This is done by checking your blood sugar (glucose) after you have not eaten for a while (fasting). You may have this done every 1-3 years.  Mammogram. This may be done every 1-2 years. Talk to your health care provider about when you should start having regular mammograms. This may depend on whether you have a family history of breast cancer.  BRCA-related cancer screening. This may be done if you have a family history of breast, ovarian, tubal, or peritoneal cancers.  Pelvic exam and Pap test. This may be done every 3 years starting at age 21. Starting at age 30, this may be done every 5 years if you have a Pap test in combination with an HPV test.  Bone density scan. This is done to screen for osteoporosis. You may have this scan if you are at high risk for osteoporosis. Discuss your test results, treatment options,   and if necessary, the need for more tests with your health care provider. Vaccines Your health care provider may recommend certain vaccines, such as:  Influenza vaccine. This is recommended every year.  Tetanus, diphtheria, and acellular pertussis (Tdap, Td) vaccine. You may need a Td booster every 10 years.  Varicella  vaccine. You may need this if you have not been vaccinated.  Zoster vaccine. You may need this after age 38.  Measles, mumps, and rubella (MMR) vaccine. You may need at least one dose of MMR if you were born in 1957 or later. You may also need a second dose.  Pneumococcal 13-valent conjugate (PCV13) vaccine. You may need this if you have certain conditions and were not previously vaccinated.  Pneumococcal polysaccharide (PPSV23) vaccine. You may need one or two doses if you smoke cigarettes or if you have certain conditions.  Meningococcal vaccine. You may need this if you have certain conditions.  Hepatitis A vaccine. You may need this if you have certain conditions or if you travel or work in places where you may be exposed to hepatitis A.  Hepatitis B vaccine. You may need this if you have certain conditions or if you travel or work in places where you may be exposed to hepatitis B.  Haemophilus influenzae type b (Hib) vaccine. You may need this if you have certain conditions. Talk to your health care provider about which screenings and vaccines you need and how often you need them. This information is not intended to replace advice given to you by your health care provider. Make sure you discuss any questions you have with your health care provider. Document Released: 09/29/2015 Document Revised: 10/23/2017 Document Reviewed: 07/04/2015 Elsevier Interactive Patient Education  2019 [employer].

## 2019-03-23 ENCOUNTER — Other Ambulatory Visit: Payer: Medicare Other

## 2019-03-23 ENCOUNTER — Other Ambulatory Visit: Payer: Self-pay

## 2019-03-23 DIAGNOSIS — Z79899 Other long term (current) drug therapy: Secondary | ICD-10-CM | POA: Diagnosis not present

## 2019-04-12 ENCOUNTER — Other Ambulatory Visit: Payer: Self-pay

## 2019-04-13 ENCOUNTER — Ambulatory Visit (INDEPENDENT_AMBULATORY_CARE_PROVIDER_SITE_OTHER): Payer: Medicare Other | Admitting: Nurse Practitioner

## 2019-04-13 ENCOUNTER — Encounter: Payer: Self-pay | Admitting: Nurse Practitioner

## 2019-04-13 VITALS — BP 108/70 | HR 75 | Temp 96.9°F | Ht 65.0 in | Wt 162.0 lb

## 2019-04-13 DIAGNOSIS — L4050 Arthropathic psoriasis, unspecified: Secondary | ICD-10-CM | POA: Diagnosis not present

## 2019-04-13 DIAGNOSIS — E782 Mixed hyperlipidemia: Secondary | ICD-10-CM | POA: Diagnosis not present

## 2019-04-13 DIAGNOSIS — I10 Essential (primary) hypertension: Secondary | ICD-10-CM

## 2019-04-13 DIAGNOSIS — Z6826 Body mass index (BMI) 26.0-26.9, adult: Secondary | ICD-10-CM

## 2019-04-13 DIAGNOSIS — F3342 Major depressive disorder, recurrent, in full remission: Secondary | ICD-10-CM | POA: Diagnosis not present

## 2019-04-13 DIAGNOSIS — M8580 Other specified disorders of bone density and structure, unspecified site: Secondary | ICD-10-CM

## 2019-04-13 DIAGNOSIS — F17200 Nicotine dependence, unspecified, uncomplicated: Secondary | ICD-10-CM

## 2019-04-13 MED ORDER — ATORVASTATIN CALCIUM 40 MG PO TABS: 40.0000 mg | ORAL_TABLET | Freq: Every day | ORAL | 1 refills | Status: DC

## 2019-04-13 MED ORDER — ESCITALOPRAM OXALATE 20 MG PO TABS: 20.0000 mg | ORAL_TABLET | Freq: Every day | ORAL | 1 refills | Status: DC

## 2019-04-13 MED ORDER — FENOFIBRATE 160 MG PO TABS: 160.0000 mg | ORAL_TABLET | Freq: Every day | ORAL | 1 refills | Status: DC

## 2019-04-13 MED ORDER — LISINOPRIL-HYDROCHLOROTHIAZIDE 20-12.5 MG PO TABS: 1.0000 | ORAL_TABLET | Freq: Every day | ORAL | 1 refills | Status: DC

## 2019-04-13 NOTE — Progress Notes (Signed)
Subjective:    Patient ID: Lisa Ball, female    DOB: [birth year], 58 y.o.   MRN: [id number]   Chief Complaint: Medical Management of Chronic Issues    HPI:  1. Hypertension, unspecified type No c/o chest pain, sob or headache. Does not check blood pressure at home BP Readings from Last 3 Encounters:  04/13/19 108/70  10/22/18 125/81  10/06/18 (!) 173/92     2. Mixed hyperlipidemia Does not watch diet and does very little exercise  3. Psoriatic arthritis [facility]) Sees rheumatology- they are trying to get her on pill. She is waiting on prior auth from insurance  4. Recurrent major depressive disorder, in full remission [facility]) She is currently on lexapro and is working well for her. Depression screen [facility] 2/9 04/13/2019 03/01/2019 01/11/2019  Decreased Interest 0 0 0  Down, Depressed, Hopeless 0 0 0  PHQ - 2 Score 0 0 0     5. Smoker Smokes over a pck a day. Has no desire to quit.  6. Osteopenia, unspecified location Last  dexascan was done 12/23/17 with t socre of -0.1 which was normal  7. BMI 29.0-29.9,adult She has lost over 16 lbs in the last year    Outpatient Encounter Medications as of 04/13/2019  Medication Sig  . albuterol (PROVENTIL HFA;VENTOLIN HFA) 108 (90 Base) MCG/ACT inhaler Inhale 2 puffs into the lungs every 6 (six) hours as needed for wheezing or shortness of breath.  Marland Kitchen atorvastatin (LIPITOR) 40 MG tablet Take 1 tablet (40 mg total) by mouth daily.  . clobetasol cream (TEMOVATE) 4.28 % Apply 1 application topically 2 (two) times daily.  Marland Kitchen escitalopram (LEXAPRO) 20 MG tablet Take 1 tablet (20 mg total) by mouth daily. (Needs to be seen before next refill)  . fenofibrate 160 MG tablet Take 1 tablet (160 mg total) by mouth daily. (Needs to be seen before next refill)  . lisinopril-hydrochlorothiazide (ZESTORETIC) 20-12.5 MG tablet Take 1 tablet by mouth daily.  . meloxicam (MOBIC) 15 MG tablet Take 15 mg by mouth daily.  . Tofacitinib Citrate 5 MG TABS  Take by mouth.     Past Surgical History:  Procedure Laterality Date  . CHOLECYSTECTOMY    . TUBAL LIGATION      Family History  Problem Relation Age of Onset  . Heart disease Mother   . Diabetes Sister   . Cancer Father 38       lung cancer-died at age 49  . Colon cancer Neg Hx   . Esophageal cancer Neg Hx   . Rectal cancer Neg Hx   . Stomach cancer Neg Hx     New complaints: None today  Social history: Lives with her boyfriend.   Controlled substance contract: N/A    Review of Systems  Constitutional: Negative for activity change and appetite change.  HENT: Negative.   Eyes: Negative for pain.  Respiratory: Negative for shortness of breath.   Cardiovascular: Negative for chest pain, palpitations and leg swelling.  Gastrointestinal: Negative for abdominal pain.  Endocrine: Negative for polydipsia.  Genitourinary: Negative.   Musculoskeletal: Positive for arthralgias.  Skin: Negative for rash.  Neurological: Negative for dizziness, weakness and headaches.  Hematological: Does not bruise/bleed easily.  Psychiatric/Behavioral: Negative.   All other systems reviewed and are negative.      Objective:   Physical Exam Vitals signs and nursing note reviewed.  Constitutional:      General: She is not in acute distress.    Appearance: Normal appearance. She  is well-developed.  HENT:     Head: Normocephalic.     Nose: Nose normal.  Eyes:     Pupils: Pupils are equal, round, and reactive to light.  Neck:     Musculoskeletal: Normal range of motion and neck supple.     Vascular: No carotid bruit or JVD.  Cardiovascular:     Rate and Rhythm: Normal rate and regular rhythm.     Heart sounds: Normal heart sounds.  Pulmonary:     Effort: Pulmonary effort is normal. No respiratory distress.     Breath sounds: Normal breath sounds. No wheezing or rales.  Chest:     Chest wall: No tenderness.  Abdominal:     General: Bowel sounds are normal. There is no  distension or abdominal bruit.     Palpations: Abdomen is soft. There is no hepatomegaly, splenomegaly, mass or pulsatile mass.     Tenderness: There is no abdominal tenderness.  Musculoskeletal: Normal range of motion.     Comments: Rises slowly from sitting to standing Gait slow until gets going.  Lymphadenopathy:     Cervical: No cervical adenopathy.  Skin:    General: Skin is warm and dry.  Neurological:     Mental Status: She is alert and oriented to person, place, and time.     Deep Tendon Reflexes: Reflexes are normal and symmetric.  Psychiatric:        Behavior: Behavior normal.        Thought Content: Thought content normal.        Judgment: Judgment normal.       BP 108/70   Pulse 75   Temp (!) 96.9 F (36.1 C) (Oral)   Ht _0  (1.651 m)   Wt 162 lb (73.5 kg)   BMI 26.96 kg/m      Assessment & Plan:  Lisa Ball comes in today with chief complaint of Medical Management of Chronic Issues   Diagnosis and orders addressed:  1.essential hypertension Low sodium diet - CMP14+EGFR - lisinopril-hydrochlorothiazide (ZESTORETIC) 20-12.5 MG tablet; Take 1 tablet by mouth daily.  Dispense: 90 tablet; Refill: 1  2. Mixed hyperlipidemia Low fat diet - atorvastatin (LIPITOR) 40 MG tablet; Take 1 tablet (40 mg total) by mouth daily.  Dispense: 90 tablet; Refill: 1 - fenofibrate 160 MG tablet; Take 1 tablet (160 mg total) by mouth daily. (Needs to be seen before next refill)  Dispense: 90 tablet; Refill: 1 - Lipid panel  3. Psoriatic arthritis ([place]) Keep follow up with specialist  4. Recurrent major depressive disorder, in full remission ([name]) Stress managemnet - escitalopram (LEXAPRO) 20 MG tablet; Take 1 tablet (20 mg total) by mouth daily. (Needs to be seen before next refill)  Dispense: 90 tablet; Refill: 1  5. Smoker Smoking cessation encouraged  6. Osteopenia, unspecified location Weight bearing exercises  7. BMI 26.0-26.9,adult Discussed diet and  exercise for person with BMI >25 Will recheck weight in 3-6 months    Labs pending Health Maintenance reviewed Diet and exercise encouraged  Follow up plan: 6 months   Mary-Margaret Hassell Done, FNP

## 2019-04-13 NOTE — Patient Instructions (Signed)
Steps to Quit Smoking Smoking tobacco is the leading cause of preventable death. It can affect almost every organ in the body. Smoking puts you and people around you at risk for many serious, long-lasting (chronic) diseases. Quitting smoking can be hard, but it is one of the best things that you can do for your health. It is never too late to quit. How do I get ready to quit? When you decide to quit smoking, make a plan to help you succeed. Before you quit:  Pick a date to quit. Set a date within the next 2 weeks to give you time to prepare.  Write down the reasons why you are quitting. Keep this list in places where you will see it often.  Tell your family, friends, and co-workers that you are quitting. Their support is important.  Talk with your doctor about the choices that may help you quit.  Find out if your health insurance will pay for these treatments.  Know the people, places, things, and activities that make you want to smoke (triggers). Avoid them. What first steps can I take to quit smoking?  Throw away all cigarettes at home, at work, and in your car.  Throw away the things that you use when you smoke, such as ashtrays and lighters.  Clean your car. Make sure to empty the ashtray.  Clean your home, including curtains and carpets. What can I do to help me quit smoking? Talk with your doctor about taking medicines and seeing a counselor at the same time. You are more likely to succeed when you do both.  If you are pregnant or breastfeeding, talk with your doctor about counseling or other ways to quit smoking. Do not take medicine to help you quit smoking unless your doctor tells you to do so. To quit smoking: Quit right away  Quit smoking totally, instead of slowly cutting back on how much you smoke over a period of time.  Go to counseling. You are more likely to quit if you go to counseling sessions regularly. Take medicine You may take medicines to help you quit. Some  medicines need a prescription, and some you can buy over-the-counter. Some medicines may contain a drug called nicotine to replace the nicotine in cigarettes. Medicines may:  Help you to stop having the desire to smoke (cravings).  Help to stop the problems that come when you stop smoking (withdrawal symptoms). Your doctor may ask you to use:  Nicotine patches, gum, or lozenges.  Nicotine inhalers or sprays.  Non-nicotine medicine that is taken by mouth. Find resources Find resources and other ways to help you quit smoking and remain smoke-free after you quit. These resources are most helpful when you use them often. They include:  Online chats with a counselor.  Phone quitlines.  Printed self-help materials.  Support groups or group counseling.  Text messaging programs.  Mobile phone apps. Use apps on your mobile phone or tablet that can help you stick to your quit plan. There are many free apps for mobile phones and tablets as well as websites. Examples include Quit Guide from the CDC and smokefree.gov  What things can I do to make it easier to quit?   Talk to your family and friends. Ask them to support and encourage you.  Call a phone quitline (1-800-QUIT-NOW), reach out to support groups, or work with a counselor.  Ask people who smoke to not smoke around you.  Avoid places that make you want to smoke,   such as: ? Bars. ? Parties. ? Smoke-break areas at work.  Spend time with people who do not smoke.  Lower the stress in your life. Stress can make you want to smoke. Try these things to help your stress: ? Getting regular exercise. ? Doing deep-breathing exercises. ? Doing yoga. ? Meditating. ? Doing a body scan. To do this, close your eyes, focus on one area of your body at a time from head to toe. Notice which parts of your body are tense. Try to relax the muscles in those areas. How will I feel when I quit smoking? Day 1 to 3 weeks Within the first 24 hours,  you may start to have some problems that come from quitting tobacco. These problems are very bad 2-3 days after you quit, but they do not often last for more than 2-3 weeks. You may get these symptoms:  Mood swings.  Feeling restless, nervous, angry, or annoyed.  Trouble concentrating.  Dizziness.  Strong desire for high-sugar foods and nicotine.  Weight gain.  Trouble pooping (constipation).  Feeling like you may vomit (nausea).  Coughing or a sore throat.  Changes in how the medicines that you take for other issues work in your body.  Depression.  Trouble sleeping (insomnia). Week 3 and afterward After the first 2-3 weeks of quitting, you may start to notice more positive results, such as:  Better sense of smell and taste.  Less coughing and sore throat.  Slower heart rate.  Lower blood pressure.  Clearer skin.  Better breathing.  Fewer sick days. Quitting smoking can be hard. Do not give up if you fail the first time. Some people need to try a few times before they succeed. Do your best to stick to your quit plan, and talk with your doctor if you have any questions or concerns. Summary  Smoking tobacco is the leading cause of preventable death. Quitting smoking can be hard, but it is one of the best things that you can do for your health.  When you decide to quit smoking, make a plan to help you succeed.  Quit smoking right away, not slowly over a period of time.  When you start quitting, seek help from your doctor, family, or friends. This information is not intended to replace advice given to you by your health care provider. Make sure you discuss any questions you have with your health care provider. Document Released: 06/29/2009 Document Revised: 11/20/2018 Document Reviewed: 11/21/2018 Elsevier Patient Education  2020 [employer]  

## 2019-04-14 LAB — CMP14+EGFR
ALT: 13 IU/L (ref 0–32)
AST: 17 IU/L (ref 0–40)
Albumin/Globulin Ratio: 1.4 (ref 1.2–2.2)
Albumin: 4.1 g/dL (ref 3.8–4.9)
Alkaline Phosphatase: 53 IU/L (ref 39–117)
BUN/Creatinine Ratio: 14 (ref 9–23)
BUN: 12 mg/dL (ref 6–24)
Bilirubin Total: 0.5 mg/dL (ref 0.0–1.2)
CO2: 27 mmol/L (ref 20–29)
Calcium: 10 mg/dL (ref 8.7–10.2)
Chloride: 95 mmol/L — ABNORMAL LOW (ref 96–106)
Creatinine, Ser: 0.87 mg/dL (ref 0.57–1.00)
GFR calc Af Amer: 85 mL/min/{1.73_m2} (ref >59)
GFR calc non Af Amer: 74 mL/min/{1.73_m2} (ref >59)
Globulin, Total: 2.9 g/dL (ref 1.5–4.5)
Glucose: 77 mg/dL (ref 65–99)
Potassium: 3.9 mmol/L (ref 3.5–5.2)
Sodium: 136 mmol/L (ref 134–144)
Total Protein: 7 g/dL (ref 6.0–8.5)

## 2019-04-14 LAB — LIPID PANEL
Chol/HDL Ratio: 5.9 ratio — ABNORMAL HIGH (ref 0.0–4.4)
Cholesterol, Total: 113 mg/dL (ref 100–199)
HDL: 19 mg/dL — ABNORMAL LOW (ref >39)
LDL Calculated: 46 mg/dL (ref 0–99)
Triglycerides: 238 mg/dL — ABNORMAL HIGH (ref 0–149)
VLDL Cholesterol Cal: 48 mg/dL — ABNORMAL HIGH (ref 5–40)

## 2019-04-19 ENCOUNTER — Encounter: Payer: Self-pay | Admitting: *Deleted

## 2019-04-22 ENCOUNTER — Telehealth: Payer: Self-pay | Admitting: Nurse Practitioner

## 2019-04-22 NOTE — Telephone Encounter (Signed)
Patient aware of results and recommendations. °

## 2019-06-29 DIAGNOSIS — L409 Psoriasis, unspecified: Secondary | ICD-10-CM | POA: Diagnosis not present

## 2019-06-29 DIAGNOSIS — L4050 Arthropathic psoriasis, unspecified: Secondary | ICD-10-CM | POA: Diagnosis not present

## 2019-06-29 DIAGNOSIS — M7061 Trochanteric bursitis, right hip: Secondary | ICD-10-CM | POA: Diagnosis not present

## 2019-06-29 DIAGNOSIS — M159 Polyosteoarthritis, unspecified: Secondary | ICD-10-CM | POA: Diagnosis not present

## 2019-10-14 ENCOUNTER — Ambulatory Visit (INDEPENDENT_AMBULATORY_CARE_PROVIDER_SITE_OTHER): Payer: Medicare Other | Admitting: Nurse Practitioner

## 2019-10-14 ENCOUNTER — Encounter: Payer: Self-pay | Admitting: Nurse Practitioner

## 2019-10-14 DIAGNOSIS — E782 Mixed hyperlipidemia: Secondary | ICD-10-CM

## 2019-10-14 DIAGNOSIS — F3342 Major depressive disorder, recurrent, in full remission: Secondary | ICD-10-CM

## 2019-10-14 DIAGNOSIS — L4050 Arthropathic psoriasis, unspecified: Secondary | ICD-10-CM

## 2019-10-14 DIAGNOSIS — M8580 Other specified disorders of bone density and structure, unspecified site: Secondary | ICD-10-CM

## 2019-10-14 DIAGNOSIS — Z6829 Body mass index (BMI) 29.0-29.9, adult: Secondary | ICD-10-CM

## 2019-10-14 DIAGNOSIS — I10 Essential (primary) hypertension: Secondary | ICD-10-CM | POA: Diagnosis not present

## 2019-10-14 DIAGNOSIS — F17200 Nicotine dependence, unspecified, uncomplicated: Secondary | ICD-10-CM

## 2019-10-14 MED ORDER — LISINOPRIL-HYDROCHLOROTHIAZIDE 20-12.5 MG PO TABS: 1.0000 | ORAL_TABLET | Freq: Every day | ORAL | 1 refills | Status: DC

## 2019-10-14 MED ORDER — ATORVASTATIN CALCIUM 40 MG PO TABS: 40.0000 mg | ORAL_TABLET | Freq: Every day | ORAL | 1 refills | Status: DC

## 2019-10-14 MED ORDER — ESCITALOPRAM OXALATE 20 MG PO TABS: 20.0000 mg | ORAL_TABLET | Freq: Every day | ORAL | 1 refills | Status: DC

## 2019-10-14 MED ORDER — FENOFIBRATE 160 MG PO TABS: 160.0000 mg | ORAL_TABLET | Freq: Every day | ORAL | 1 refills | Status: DC

## 2019-10-14 NOTE — Progress Notes (Signed)
Virtual Visit via telephone Note Due to COVID-19 pandemic this visit was conducted virtually. This visit type was conducted due to national recommendations for restrictions regarding the COVID-19 Pandemic (e.g. social distancing, sheltering in place) in an effort to limit this patient's exposure and mitigate transmission in our community. All issues noted in this document were discussed and addressed.  A physical exam was not performed with this format.  I connected with Lisa Ball on 10/14/19 at 1:55 by telephone and verified that I am speaking with the correct person using two identifiers. Lisa Ball is currently located at home and no one is currently with  her during visit. The provider, Mary-Margaret Hassell Done, FNP is located in their office at time of visit.  I discussed the limitations, risks, security and privacy concerns of performing an evaluation and management service by telephone and the availability of in person appointments. I also discussed with the patient that there may be a patient responsible charge related to this service. The patient expressed understanding and agreed to proceed.   History and Present Illness:   Chief Complaint: Medical Management of Chronic Issues    HPI:  1. Hypertension, unspecified type No c/o chest pain, sob or headache. Does not check blood pressure at home. BP Readings from Last 3 Encounters:  04/13/19 108/70  10/22/18 125/81  10/06/18 (!) 173/92     2. Mixed hyperlipidemia Does not really watch diet and does very little exercise. Lab Results  Component Value Date   CHOL 113 04/13/2019   HDL 19 (L) 04/13/2019   LDLCALC 46 04/13/2019   TRIG 238 (H) 04/13/2019   CHOLHDL 5.9 (H) 04/13/2019     3. Osteopenia, unspecified location Last dexascan was done 12/23/17. t score was -0.1. she does little to no weight bearing exercise.  4. Recurrent major depressive disorder, in full remission ([place]) Is on lexapro. And says she is doing  well. Depression screen [facility] 2/9 10/14/2019 04/13/2019 03/01/2019  Decreased Interest 0 0 0  Down, Depressed, Hopeless 0 0 0  PHQ - 2 Score 0 0 0     5. Psoriatic arthritis [facility]) She sees a rheumatologist. She has started on a xeljanz and that has really been working well for her.   6. Smoker She smokes over a pack a day.  7. BMI 29.0-29.9,adult denies recent weight changes Wt Readings from Last 3 Encounters:  04/13/19 162 lb (73.5 kg)  10/22/18 163 lb 12.8 oz (74.3 kg)  10/06/18 163 lb (73.9 kg)   BMI Readings from Last 3 Encounters:  04/13/19 26.96 kg/m  10/22/18 27.26 kg/m  10/06/18 27.12 kg/m       Outpatient Encounter Medications as of 10/14/2019  Medication Sig  . albuterol (PROVENTIL HFA;VENTOLIN HFA) 108 (90 Base) MCG/ACT inhaler Inhale 2 puffs into the lungs every 6 (six) hours as needed for wheezing or shortness of breath.  Marland Kitchen atorvastatin (LIPITOR) 40 MG tablet Take 1 tablet (40 mg total) by mouth daily.  . clobetasol cream (TEMOVATE) 6.54 % Apply 1 application topically 2 (two) times daily.  Marland Kitchen escitalopram (LEXAPRO) 20 MG tablet Take 1 tablet (20 mg total) by mouth daily. (Needs to be seen before next refill)  . fenofibrate 160 MG tablet Take 1 tablet (160 mg total) by mouth daily. (Needs to be seen before next refill)  . lisinopril-hydrochlorothiazide (ZESTORETIC) 20-12.5 MG tablet Take 1 tablet by mouth daily.  . meloxicam (MOBIC) 15 MG tablet Take 15 mg by mouth daily.  . Tofacitinib Citrate  5 MG TABS Take by mouth.     Past Surgical History:  Procedure Laterality Date  . CHOLECYSTECTOMY    . TUBAL LIGATION      Family History  Problem Relation Age of Onset  . Heart disease Mother   . Diabetes Sister   . Cancer Father 27       lung cancer-died at age 28  . Colon cancer Neg Hx   . Esophageal cancer Neg Hx   . Rectal cancer Neg Hx   . Stomach cancer Neg Hx     New complaints: None today  Social history: Lives with her husband  Controlled  substance contract: n/a    Review of Systems  Constitutional: Negative for diaphoresis and weight loss.  Eyes: Negative for blurred vision, double vision and pain.  Respiratory: Negative for shortness of breath.   Cardiovascular: Negative for chest pain, palpitations, orthopnea and leg swelling.  Gastrointestinal: Negative for abdominal pain.  Musculoskeletal: Positive for joint pain and neck pain.  Skin: Negative for rash.  Neurological: Negative for dizziness, sensory change, loss of consciousness, weakness and headaches.  Endo/Heme/Allergies: Negative for polydipsia. Does not bruise/bleed easily.  Psychiatric/Behavioral: Negative for memory loss. The patient does not have insomnia.   All other systems reviewed and are negative.    Observations/Objective: Alert and oriented- answers all questions appropriately No distress    Assessment and Plan: Lisa Ball comes in today with chief complaint of Medical Management of Chronic Issues   Diagnosis and orders addressed:  1. Essential hypertension - lisinopril-hydrochlorothiazide (ZESTORETIC) 20-12.5 MG tablet; Take 1 tablet by mouth daily.  Dispense: 90 tablet; Refill: 1 Low sodium diet - CMP14+EGFR; Future  2. Mixed hyperlipidemia Low fat diet - atorvastatin (LIPITOR) 40 MG tablet; Take 1 tablet (40 mg total) by mouth daily.  Dispense: 90 tablet; Refill: 1 - fenofibrate 160 MG tablet; Take 1 tablet (160 mg total) by mouth daily. (Needs to be seen before next refill)  Dispense: 90 tablet; Refill: 1 - Lipid panel; Future  3. Osteopenia, unspecified location Weight bearing exercises if can tolterate Add multi vitamin with vitamin d and calcium  4. Recurrent major depressive disorder, in full remission ([place]) Stress management - escitalopram (LEXAPRO) 20 MG tablet; Take 1 tablet (20 mg total) by mouth daily. (Needs to be seen before next refill)  Dispense: 90 tablet; Refill: 1  5. Psoriatic arthritis ([place]) Keep follow  up with rheumatology - CBC with Differential/Platelet; Future  6. Smoker Smoking cessation encouraged  7. BMI 29.0-29.9,adult Discussed diet and exercise for person with BMI >25 Will recheck weight in 3-6 months   Labs pending Health Maintenance reviewed Diet and exercise encouraged  Follow up plan: 6 months      I discussed the assessment and treatment plan with the patient. The patient was provided an opportunity to ask questions and all were answered. The patient agreed with the plan and demonstrated an understanding of the instructions.   The patient was advised to call back or seek an in-person evaluation if the symptoms worsen or if the condition fails to improve as anticipated.  The above assessment and management plan was discussed with the patient. The patient verbalized understanding of and has agreed to the management plan. Patient is aware to call the clinic if symptoms persist or worsen. Patient is aware when to return to the clinic for a follow-up visit. Patient educated on when it is appropriate to go to the emergency department.   Time call ended:  12:10  I provided 15 minutes of non-face-to-face time during this encounter.    Mary-Margaret Hassell Done, FNP

## 2019-10-27 ENCOUNTER — Other Ambulatory Visit: Payer: Medicare Other

## 2019-10-27 DIAGNOSIS — L4050 Arthropathic psoriasis, unspecified: Secondary | ICD-10-CM

## 2019-10-27 DIAGNOSIS — I10 Essential (primary) hypertension: Secondary | ICD-10-CM

## 2019-10-27 DIAGNOSIS — E782 Mixed hyperlipidemia: Secondary | ICD-10-CM

## 2019-10-28 LAB — LIPID PANEL
Chol/HDL Ratio: 5.3 ratio — ABNORMAL HIGH (ref 0.0–4.4)
Cholesterol, Total: 128 mg/dL (ref 100–199)
HDL: 24 mg/dL — ABNORMAL LOW (ref >39)
LDL Chol Calc (NIH): 69 mg/dL (ref 0–99)
Triglycerides: 208 mg/dL — ABNORMAL HIGH (ref 0–149)
VLDL Cholesterol Cal: 35 mg/dL (ref 5–40)

## 2019-10-28 LAB — CMP14+EGFR
ALT: 18 IU/L (ref 0–32)
AST: 18 IU/L (ref 0–40)
Albumin/Globulin Ratio: 1.5 (ref 1.2–2.2)
Albumin: 4.2 g/dL (ref 3.8–4.9)
Alkaline Phosphatase: 54 IU/L (ref 39–117)
BUN/Creatinine Ratio: 9 (ref 9–23)
BUN: 8 mg/dL (ref 6–24)
Bilirubin Total: 0.4 mg/dL (ref 0.0–1.2)
CO2: 25 mmol/L (ref 20–29)
Calcium: 10.3 mg/dL — ABNORMAL HIGH (ref 8.7–10.2)
Chloride: 101 mmol/L (ref 96–106)
Creatinine, Ser: 0.91 mg/dL (ref 0.57–1.00)
GFR calc Af Amer: 80 mL/min/{1.73_m2} (ref >59)
GFR calc non Af Amer: 70 mL/min/{1.73_m2} (ref >59)
Globulin, Total: 2.8 g/dL (ref 1.5–4.5)
Glucose: 112 mg/dL — ABNORMAL HIGH (ref 65–99)
Potassium: 3.6 mmol/L (ref 3.5–5.2)
Sodium: 142 mmol/L (ref 134–144)
Total Protein: 7 g/dL (ref 6.0–8.5)

## 2019-10-28 LAB — CBC WITH DIFFERENTIAL/PLATELET
Basophils Absolute: 0.1 10*3/uL (ref 0.0–0.2)
Basos: 1 %
EOS (ABSOLUTE): 0.2 10*3/uL (ref 0.0–0.4)
Eos: 3 %
Hematocrit: 42.5 % (ref 34.0–46.6)
Hemoglobin: 14.6 g/dL (ref 11.1–15.9)
Immature Grans (Abs): 0 10*3/uL (ref 0.0–0.1)
Immature Granulocytes: 0 %
Lymphocytes Absolute: 4.6 10*3/uL — ABNORMAL HIGH (ref 0.7–3.1)
Lymphs: 58 %
MCH: 32.5 pg (ref 26.6–33.0)
MCHC: 34.4 g/dL (ref 31.5–35.7)
MCV: 95 fL (ref 79–97)
Monocytes Absolute: 0.7 10*3/uL (ref 0.1–0.9)
Monocytes: 9 %
Neutrophils Absolute: 2.3 10*3/uL (ref 1.4–7.0)
Neutrophils: 29 %
Platelets: 394 10*3/uL (ref 150–450)
RBC: 4.49 x10E6/uL (ref 3.77–5.28)
RDW: 12.2 % (ref 11.7–15.4)
WBC: 7.9 10*3/uL (ref 3.4–10.8)

## 2019-11-02 DIAGNOSIS — Z79899 Other long term (current) drug therapy: Secondary | ICD-10-CM | POA: Diagnosis not present

## 2019-11-02 DIAGNOSIS — M159 Polyosteoarthritis, unspecified: Secondary | ICD-10-CM | POA: Diagnosis not present

## 2019-11-02 DIAGNOSIS — L4050 Arthropathic psoriasis, unspecified: Secondary | ICD-10-CM | POA: Diagnosis not present

## 2019-11-02 DIAGNOSIS — Z888 Allergy status to other drugs, medicaments and biological substances status: Secondary | ICD-10-CM | POA: Diagnosis not present

## 2019-11-02 DIAGNOSIS — L409 Psoriasis, unspecified: Secondary | ICD-10-CM | POA: Diagnosis not present

## 2019-11-02 DIAGNOSIS — Z880 Allergy status to penicillin: Secondary | ICD-10-CM | POA: Diagnosis not present

## 2019-12-10 ENCOUNTER — Telehealth: Payer: Self-pay | Admitting: Nurse Practitioner

## 2019-12-10 NOTE — Chronic Care Management (AMB) (Signed)
  Chronic Care Management   Note  12/10/2019 Name: BETTEJANE LEAVENS MRN: [id number] DOB: 1961-08-26  Imajean Mcdermid Delia is a 59 y.o. year old female who is a primary care patient of Chevis Pretty, [place]. I reached out to Nonah Mattes by phone today in response to a referral sent by Ms. Izell Whitewater Jurgens's health plan.     Ms. Kildow was given information about Chronic Care Management services today including:  1. CCM service includes personalized support from designated clinical staff supervised by her physician, including individualized plan of care and coordination with other care providers 2. 24/7 contact phone numbers for assistance for urgent and routine care needs. 3. Service will only be billed when office clinical staff spend 20 minutes or more in a month to coordinate care. 4. Only one practitioner may furnish and bill the service in a calendar month. 5. The patient may stop CCM services at any time (effective at the end of the month) by phone call to the office staff. 6. The patient will be responsible for cost sharing (co-pay) of up to 20% of the service fee (after annual deductible is met).  Patient did not agree to enrollment in care management services and does not wish to consider at this time.  Follow up plan: The patient has been provided with contact information for the care management team and has been advised to call with any health related questions or concerns.   [place], [place] [address] Direct Dial: [phone] [email or web address] Website: [email or web address]

## 2020-04-12 ENCOUNTER — Ambulatory Visit (INDEPENDENT_AMBULATORY_CARE_PROVIDER_SITE_OTHER): Payer: Medicare Other

## 2020-04-12 DIAGNOSIS — Z0000 Encounter for general adult medical examination without abnormal findings: Secondary | ICD-10-CM

## 2020-04-12 NOTE — Progress Notes (Signed)
MEDICARE ANNUAL WELLNESS VISIT  04/12/2020  Telephone Visit Disclaimer This Medicare AWV was conducted by telephone due to national recommendations for restrictions regarding the COVID-19 Pandemic (e.g. social distancing).  I verified, using two identifiers, that I am speaking with Lisa Ball or their authorized healthcare agent. I discussed the limitations, risks, security, and privacy concerns of performing an evaluation and management service by telephone and the potential availability of an in-person appointment in the future. The patient expressed understanding and agreed to proceed.   Subjective:  Lisa Ball is a 59 y.o. female patient of Chevis Pretty, [place] who had a Medicare Annual Wellness Visit today via telephone. Lisa Ball is Disabled and lives alone.  She has one child, two grandchildren and one great-grandchild. She reports that she is socially active and does interact with friends/family regularly. She is minimally physically active and enjoys spending time with her children and grandchildren.  Patient Care Team: Chevis Pretty, FNP as PCP - General (Nurse Practitioner)  Advanced Directives 04/12/2020 03/01/2019  Does Patient Have a Medical Advance Directive? No No  Would patient like information on creating a medical advance directive? No - Patient declined No - Patient declined    Hospital Utilization Over the Past 12 Months: # of hospitalizations or ER visits: 0 # of surgeries: 0  Review of Systems    Patient reports that her overall health is unchanged compared to last year.  History obtained from chart review and the patient  Patient Reported Readings (BP, Pulse, CBG, Weight, etc) none  Pain Assessment Pain : No/denies pain     Current Medications & Allergies (verified) Allergies as of 04/12/2020      Reactions   Methotrexate Derivatives Shortness Of Breath   Penicillins Other (See Comments)   Couldn't  move      Medication List        Accurate as of April 12, 2020  9:36 AM. If you have any questions, ask your nurse or doctor.        albuterol 108 (90 Base) MCG/ACT inhaler Commonly known as: VENTOLIN HFA Inhale 2 puffs into the lungs every 6 (six) hours as needed for wheezing or shortness of breath.   atorvastatin 40 MG tablet Commonly known as: Lipitor Take 1 tablet (40 mg total) by mouth daily.   clobetasol cream 0.05 % Commonly known as: TEMOVATE Apply 1 application topically 2 (two) times daily.   escitalopram 20 MG tablet Commonly known as: LEXAPRO Take 1 tablet (20 mg total) by mouth daily. (Needs to be seen before next refill)   fenofibrate 160 MG tablet Take 1 tablet (160 mg total) by mouth daily. (Needs to be seen before next refill)   lisinopril-hydrochlorothiazide 20-12.5 MG tablet Commonly known as: ZESTORETIC Take 1 tablet by mouth daily.   meloxicam 15 MG tablet Commonly known as: MOBIC Take 15 mg by mouth daily.   Tofacitinib Citrate 5 MG Tabs Take by mouth.       History (reviewed): Past Medical History:  Diagnosis Date  . Anxiety   . Colon polyps   . Depression   . Hyperlipidemia   . Hypertension   . Psoriatic arthritis ([place])   . Pulmonary fibrosis ([place])   . RA (rheumatoid arthritis) ([place])    Past Surgical History:  Procedure Laterality Date  . CHOLECYSTECTOMY    . TUBAL LIGATION     Family History  Problem Relation Age of Onset  . Heart disease Mother   . Diabetes Sister   .  Cancer Father 39       lung cancer-died at age 82  . Seizures Maternal Grandmother   . Heart disease Maternal Grandfather   . Seizures Paternal Grandmother   . Heart disease Paternal Grandfather   . Colon cancer Neg Hx   . Esophageal cancer Neg Hx   . Rectal cancer Neg Hx   . Stomach cancer Neg Hx    Social History   Socioeconomic History  . Marital status: Widowed    Spouse name: Not on file  . Number of children: 1  . Years of education: 3  . Highest education level: 9th grade    Occupational History  . Occupation: disability  Tobacco Use  . Smoking status: Current Every Day Smoker    Packs/day: 1.00    Years: 35.00    Pack years: 35.00    Types: Cigarettes  . Smokeless tobacco: Never Used  Vaping Use  . Vaping Use: Never used  Substance and Sexual Activity  . Alcohol use: No  . Drug use: No  . Sexual activity: Not Currently    Birth control/protection: Surgical  Other Topics Concern  . Not on file  Social History Narrative  . Not on file   Social Determinants of Health   Financial Resource Strain:   . Difficulty of Paying Living Expenses:   Food Insecurity:   . Worried About Charity fundraiser in the Last Year:   . Arboriculturist in the Last Year:   Transportation Needs:   . Film/video editor (Medical):   Marland Kitchen Lack of Transportation (Non-Medical):   Physical Activity:   . Days of Exercise per Week:   . Minutes of Exercise per Session:   Stress:   . Feeling of Stress :   Social Connections:   . Frequency of Communication with Friends and Family:   . Frequency of Social Gatherings with Friends and Family:   . Attends Religious Services:   . Active Member of Clubs or Organizations:   . Attends Archivist Meetings:   Marland Kitchen Marital Status:     Activities of Daily Living In your present state of health, do you have any difficulty performing the following activities: 04/12/2020  Hearing? N  Vision? N  Difficulty concentrating or making decisions? N  Walking or climbing stairs? N  Dressing or bathing? N  Doing errands, shopping? N  Preparing Food and eating ? N  Using the Toilet? N  In the past six months, have you accidently leaked urine? N  Do you have problems with loss of bowel control? N  Managing your Medications? N  Managing your Finances? N  Housekeeping or managing your Housekeeping? N  Some recent data might be hidden    Patient Education/ Literacy How often do you need to have someone help you when you read  instructions, pamphlets, or other written materials from your doctor or pharmacy?: 1 - Never What is the last grade level you completed in school?: 10th grade  Exercise Current Exercise Habits: The patient does not participate in regular exercise at present, Exercise limited by: None identified  Diet Patient reports consuming 3 meals a day and 0 snack(s) a day Patient reports that her primary diet is: Regular Patient reports that she does have regular access to food.   Depression Screen PHQ 2/9 Scores 04/12/2020 10/14/2019 04/13/2019 03/01/2019 01/11/2019 10/22/2018 10/06/2018  PHQ - 2 Score 0 0 0 0 0 0 0     Fall Risk Fall  Risk  04/12/2020 10/14/2019 04/13/2019 03/01/2019 08/31/2018  Falls in the past year? 0 0 0 0 0  Follow up Falls evaluation completed - - - -     Objective:  Lisa Ball seemed alert and oriented and she participated appropriately during our telephone visit.  Blood Pressure Weight BMI  BP Readings from Last 3 Encounters:  04/13/19 108/70  10/22/18 125/81  10/06/18 (!) 173/92   Wt Readings from Last 3 Encounters:  04/13/19 162 lb (73.5 kg)  10/22/18 163 lb 12.8 oz (74.3 kg)  10/06/18 163 lb (73.9 kg)   BMI Readings from Last 1 Encounters:  04/13/19 26.96 kg/m    *Unable to obtain current vital signs, weight, and BMI due to telephone visit type  Hearing/Vision  . Emberlie did not seem to have difficulty with hearing/understanding during the telephone conversation . Reports that she has had a formal eye exam by an eye care professional within the past year . Reports that she has not had a formal hearing evaluation within the past year *Unable to fully assess hearing and vision during telephone visit type  Cognitive Function: 6CIT Screen 04/12/2020 03/01/2019  What Year? 0 points 0 points  What month? 0 points 0 points  What time? 0 points 0 points  Count back from 20 0 points 0 points  Months in reverse 0 points 0 points  Repeat phrase 0 points 2 points  Total  Score 0 2   (Normal:0-7, Significant for Dysfunction: >8)  Normal Cognitive Function Screening: Yes   Immunization & Health Maintenance Record Immunization History  Administered Date(s) Administered  . Influenza,inj,Quad PF,6+ Mos 07/24/2011  . Influenza-Unspecified 05/19/2014  . PPD Test 09/23/2012, 03/30/2014, 03/27/2015, 03/28/2016, 03/25/2017, 10/06/2018  . Pneumococcal Polysaccharide-23 10/16/2004  . Tdap 01/31/2011    Health Maintenance  Topic Date Due  . COVID-19 Vaccine (1) Never done  . HIV Screening  Never done  . MAMMOGRAM  01/16/2020  . INFLUENZA VACCINE  04/16/2020  . PAP SMEAR-Modifier  06/24/2020  . TETANUS/TDAP  01/30/2021  . COLONOSCOPY  02/05/2022  . Hepatitis C Screening  Completed       Assessment  This is a routine wellness examination for [employer].  Health Maintenance: Due or Overdue Health Maintenance Due  Topic Date Due  . COVID-19 Vaccine (1) Never done  . HIV Screening  Never done  . MAMMOGRAM  01/16/2020    Lisa [place] does not need a referral for [employer] Assistance: Care Management:   no Social Work:    no Prescription Assistance:  no Nutrition/Diabetes Education:  no   Plan:  Personalized Goals Goals Addressed            This Visit's Progress   . Patient Stated       04/12/2020 AWV Goal: Exercise for General Health   Patient will verbalize understanding of the benefits of increased physical activity:  Exercising regularly is important. It will improve your overall fitness, flexibility, and endurance.  Regular exercise also will improve your overall health. It can help you control your weight, reduce stress, and improve your bone density.  Over the next year, patient will increase physical activity as tolerated with a goal of at least 150 minutes of moderate physical activity per week.   You can tell that you are exercising at a moderate intensity if your heart starts beating faster and you start breathing  faster but can still hold a conversation.  Moderate-intensity exercise ideas include:  Walking 1 mile (1.6  km) in about 15 minutes  [place]  Water aerobics  Patient will verbalize understanding of everyday activities that increase physical activity by providing examples like the following: ? Yard work, such as: ? Pushing a Conservation officer, nature ? Raking and bagging leaves ? Washing your car ? Pushing a stroller ? Shoveling snow ? Gardening ? Washing windows or floors  Patient will be able to explain general safety guidelines for exercising:   Before you start a new exercise program, talk with your health care provider.  Do not exercise so much that you hurt yourself, feel dizzy, or get very short of breath.  Wear comfortable clothes and wear shoes with good support.  Drink plenty of water while you exercise to prevent dehydration or heat stroke.  Work out until your breathing and your heartbeat get faster.       Personalized Health Maintenance & Screening Recommendations  Screening mammography  Lung Cancer Screening Recommended: yes (Low Dose CT Chest recommended if Age 71-80 years, 30 pack-year currently smoking OR have quit w/in past 15 years) Hepatitis C Screening recommended: no HIV Screening recommended: yes  Advanced Directives: Written information was not prepared per patient's request.  Referrals & Orders No orders of the defined types were placed in this encounter.   Follow-up Plan . Follow-up with Chevis Pretty, FNP as planned . Schedule mammogram    I have personally reviewed and noted the following in the patient's chart:   . Medical and social history . Use of alcohol, tobacco or illicit drugs  . Current medications and supplements . Functional ability and status . Nutritional status . Physical activity . Advanced directives . List of other physicians . Hospitalizations, surgeries, and ER visits in previous 12  months . Vitals . Screenings to include cognitive, depression, and falls . Referrals and appointments  In addition, I have reviewed and discussed with Lisa Ball certain preventive protocols, quality metrics, and best practice recommendations. A written personalized care plan for preventive services as well as general preventive health recommendations is available and can be mailed to the patient at her request.      Felicity Coyer, LPN    [id number]/03/6225    After visit summary was not mailed per patient request.

## 2020-05-02 DIAGNOSIS — Z79899 Other long term (current) drug therapy: Secondary | ICD-10-CM | POA: Diagnosis not present

## 2020-05-02 DIAGNOSIS — Z880 Allergy status to penicillin: Secondary | ICD-10-CM | POA: Diagnosis not present

## 2020-05-02 DIAGNOSIS — L4050 Arthropathic psoriasis, unspecified: Secondary | ICD-10-CM | POA: Diagnosis not present

## 2020-05-02 DIAGNOSIS — M159 Polyosteoarthritis, unspecified: Secondary | ICD-10-CM | POA: Diagnosis not present

## 2020-05-02 DIAGNOSIS — Z888 Allergy status to other drugs, medicaments and biological substances status: Secondary | ICD-10-CM | POA: Diagnosis not present

## 2020-05-02 DIAGNOSIS — L409 Psoriasis, unspecified: Secondary | ICD-10-CM | POA: Diagnosis not present

## 2020-06-01 DIAGNOSIS — R05 Cough: Secondary | ICD-10-CM | POA: Diagnosis not present

## 2020-06-06 ENCOUNTER — Ambulatory Visit (INDEPENDENT_AMBULATORY_CARE_PROVIDER_SITE_OTHER): Payer: Medicare Other | Admitting: Nurse Practitioner

## 2020-06-06 ENCOUNTER — Encounter: Payer: Self-pay | Admitting: Nurse Practitioner

## 2020-06-06 DIAGNOSIS — J440 Chronic obstructive pulmonary disease with acute lower respiratory infection: Secondary | ICD-10-CM

## 2020-06-06 DIAGNOSIS — J209 Acute bronchitis, unspecified: Secondary | ICD-10-CM | POA: Diagnosis not present

## 2020-06-06 MED ORDER — AZITHROMYCIN 250 MG PO TABS: ORAL_TABLET | ORAL | 0 refills | Status: DC

## 2020-06-06 MED ORDER — BENZONATATE 100 MG PO CAPS: 100.0000 mg | ORAL_CAPSULE | Freq: Three times a day (TID) | ORAL | 0 refills | Status: DC | PRN

## 2020-06-06 NOTE — Progress Notes (Signed)
Virtual Visit via telephone Note Due to COVID-19 pandemic this visit was conducted virtually. This visit type was conducted due to national recommendations for restrictions regarding the COVID-19 Pandemic (e.g. social distancing, sheltering in place) in an effort to limit this patient's exposure and mitigate transmission in our community. All issues noted in this document were discussed and addressed.  A physical exam was not performed with this format.  I connected with Lisa Ball on 06/06/20 at 3:15 by telephone and verified that I am speaking with the correct person using two identifiers. Lisa Ball is currently located at home and no one is currently with  her during visit. The provider, Mary-Margaret Hassell Done, FNP is located in their office at time of visit.  I discussed the limitations, risks, security and privacy concerns of performing an evaluation and management service by telephone and the availability of in person appointments. I also discussed with the patient that there may be a patient responsible charge related to this service. The patient expressed understanding and agreed to proceed.   History and Present Illness:  Chief Complaint: Cough   HPI Patient calls in for telephone appointment stating that she has had cough and congestion that started 4 days ago. She tested for covid last Thursday and was negative. She denies any fever.   Review of Systems  Constitutional: Negative for chills and fever.  HENT: Positive for congestion and sore throat.   Respiratory: Positive for cough and sputum production. Negative for shortness of breath.   Cardiovascular: Negative for chest pain.  Musculoskeletal: Negative for myalgias.  Neurological: Positive for dizziness (slight). Negative for headaches.  Psychiatric/Behavioral: Negative.      Observations/Objective: Alert and oriented- answers all questions appropriately No distress Hoarse Deep wet cough No  sob  Assessment and Plan: Lisa Ball in today with chief complaint of Cough   1. Acute bronchitis with COPD ([place]) 1. Take meds as prescribed 2. Use a cool mist humidifier especially during the winter months and when heat has been humid. 3. Use saline nose sprays frequently 4. Saline irrigations of the nose can be very helpful if done frequently.  * 4X daily for 1 week*  * Use of a nettie pot can be helpful with this. Follow directions with this* 5. Drink plenty of fluids 6. Keep thermostat turn down low 7.For any cough or congestion  Use plain Mucinex- regular strength or max strength is fine   * Children- consult with Pharmacist for dosing 8. For fever or aces or pains- take tylenol or ibuprofen appropriate for age and weight.  * for fevers greater than 101 orally you may alternate ibuprofen and tylenol every  3 hours.    - benzonatate (TESSALON PERLES) 100 MG capsule; Take 1 capsule (100 mg total) by mouth 3 (three) times daily as needed for cough.  Dispense: 20 capsule; Refill: 0 - azithromycin (ZITHROMAX Z-PAK) 250 MG tablet; As directed  Dispense: 6 tablet; Refill: 0     Follow Up Instructions: prn    I discussed the assessment and treatment plan with the patient. The patient was provided an opportunity to ask questions and all were answered. The patient agreed with the plan and demonstrated an understanding of the instructions.   The patient was advised to call back or seek an in-person evaluation if the symptoms worsen or if the condition fails to improve as anticipated.  The above assessment and management plan was discussed with the patient. The patient verbalized understanding of and  has agreed to the management plan. Patient is aware to call the clinic if symptoms persist or worsen. Patient is aware when to return to the clinic for a follow-up visit. Patient educated on when it is appropriate to go to the emergency department.   Time call ended:  3:34  I  provided 19 minutes of non-face-to-face time during this encounter.    Mary-Margaret Hassell Done, FNP

## 2020-06-07 ENCOUNTER — Ambulatory Visit: Payer: Medicare Other | Admitting: Family Medicine

## 2020-06-08 ENCOUNTER — Ambulatory Visit: Payer: Medicare Other | Admitting: Family Medicine

## 2020-06-13 ENCOUNTER — Other Ambulatory Visit: Payer: Self-pay | Admitting: Nurse Practitioner

## 2020-06-13 ENCOUNTER — Telehealth: Payer: Self-pay | Admitting: Nurse Practitioner

## 2020-06-13 DIAGNOSIS — J209 Acute bronchitis, unspecified: Secondary | ICD-10-CM

## 2020-06-13 DIAGNOSIS — E782 Mixed hyperlipidemia: Secondary | ICD-10-CM

## 2020-06-13 DIAGNOSIS — I10 Essential (primary) hypertension: Secondary | ICD-10-CM

## 2020-06-13 DIAGNOSIS — F3342 Major depressive disorder, recurrent, in full remission: Secondary | ICD-10-CM

## 2020-06-13 DIAGNOSIS — J440 Chronic obstructive pulmonary disease with acute lower respiratory infection: Secondary | ICD-10-CM

## 2020-06-13 MED ORDER — BENZONATATE 100 MG PO CAPS: 100.0000 mg | ORAL_CAPSULE | Freq: Three times a day (TID) | ORAL | 0 refills | Status: DC | PRN

## 2020-06-13 NOTE — Telephone Encounter (Signed)
Z pak works for 10 days even though only take for 5 days. She needs  to give it more time.

## 2020-06-13 NOTE — Telephone Encounter (Signed)
Tessalon perles reordered

## 2020-06-13 NOTE — Telephone Encounter (Signed)
Pt informed to give the Zpak more time and to continue taking Tessalon.

## 2020-06-13 NOTE — Telephone Encounter (Signed)
Can patient have something for cough. 

## 2020-06-13 NOTE — Telephone Encounter (Signed)
  Incoming Patient Call  06/13/2020  What symptoms do you have? Pt is hoarse and has a little bit of a cough  How long have you been sick? For about week, she has finished taking her medication but is not better   Have you been seen for this problem? Yes 09/21 by MMM  If your provider decides to give you a prescription, which pharmacy would you like for it to be sent to? [facility]    Patient informed that this information will be sent to the clinical staff for review and that they should receive a follow up call.

## 2020-06-26 ENCOUNTER — Other Ambulatory Visit: Payer: Self-pay | Admitting: Nurse Practitioner

## 2020-06-26 DIAGNOSIS — I10 Essential (primary) hypertension: Secondary | ICD-10-CM

## 2020-06-26 DIAGNOSIS — E782 Mixed hyperlipidemia: Secondary | ICD-10-CM

## 2020-06-26 DIAGNOSIS — F3342 Major depressive disorder, recurrent, in full remission: Secondary | ICD-10-CM

## 2020-08-03 ENCOUNTER — Other Ambulatory Visit: Payer: Self-pay | Admitting: Nurse Practitioner

## 2020-08-03 DIAGNOSIS — I10 Essential (primary) hypertension: Secondary | ICD-10-CM

## 2020-08-03 DIAGNOSIS — E782 Mixed hyperlipidemia: Secondary | ICD-10-CM

## 2020-08-03 DIAGNOSIS — F3342 Major depressive disorder, recurrent, in full remission: Secondary | ICD-10-CM

## 2020-08-04 NOTE — Telephone Encounter (Signed)
lmtcb to schedule follow up appt with PCP for refills.

## 2020-08-04 NOTE — Telephone Encounter (Signed)
MMM NTBS 30 days given 06/13/20

## 2020-08-08 ENCOUNTER — Telehealth: Payer: Self-pay

## 2020-08-08 ENCOUNTER — Other Ambulatory Visit: Payer: Self-pay | Admitting: Nurse Practitioner

## 2020-08-08 DIAGNOSIS — F3342 Major depressive disorder, recurrent, in full remission: Secondary | ICD-10-CM

## 2020-08-08 DIAGNOSIS — E782 Mixed hyperlipidemia: Secondary | ICD-10-CM

## 2020-08-08 DIAGNOSIS — I10 Essential (primary) hypertension: Secondary | ICD-10-CM

## 2020-08-08 MED ORDER — FENOFIBRATE 160 MG PO TABS: 160.0000 mg | ORAL_TABLET | Freq: Every day | ORAL | 0 refills | Status: DC

## 2020-08-08 MED ORDER — LISINOPRIL-HYDROCHLOROTHIAZIDE 20-12.5 MG PO TABS: 1.0000 | ORAL_TABLET | Freq: Every day | ORAL | 0 refills | Status: DC

## 2020-08-08 MED ORDER — ESCITALOPRAM OXALATE 20 MG PO TABS: 20.0000 mg | ORAL_TABLET | Freq: Every day | ORAL | 0 refills | Status: DC

## 2020-08-08 MED ORDER — ATORVASTATIN CALCIUM 40 MG PO TABS: 40.0000 mg | ORAL_TABLET | Freq: Every day | ORAL | 0 refills | Status: DC

## 2020-08-08 NOTE — Telephone Encounter (Signed)
Pt called and made an appt to see MMM on 09/14/20 (first available appt) for med refill on all meds. Pt said she would run out before her appt and is requesting that MMM send her in enough medicine to last her until her appt.

## 2020-08-08 NOTE — Telephone Encounter (Signed)
All prescritptions refilled for 1 month

## 2020-08-29 ENCOUNTER — Other Ambulatory Visit: Payer: Self-pay | Admitting: Nurse Practitioner

## 2020-08-29 DIAGNOSIS — I10 Essential (primary) hypertension: Secondary | ICD-10-CM

## 2020-08-29 DIAGNOSIS — E782 Mixed hyperlipidemia: Secondary | ICD-10-CM

## 2020-08-29 DIAGNOSIS — F3342 Major depressive disorder, recurrent, in full remission: Secondary | ICD-10-CM

## 2020-09-04 ENCOUNTER — Other Ambulatory Visit: Payer: Self-pay | Admitting: Nurse Practitioner

## 2020-09-04 DIAGNOSIS — E782 Mixed hyperlipidemia: Secondary | ICD-10-CM

## 2020-09-05 ENCOUNTER — Other Ambulatory Visit: Payer: Self-pay | Admitting: Nurse Practitioner

## 2020-09-05 DIAGNOSIS — E782 Mixed hyperlipidemia: Secondary | ICD-10-CM

## 2020-09-13 ENCOUNTER — Other Ambulatory Visit: Payer: Self-pay | Admitting: Nurse Practitioner

## 2020-09-13 DIAGNOSIS — F3342 Major depressive disorder, recurrent, in full remission: Secondary | ICD-10-CM

## 2020-09-13 DIAGNOSIS — I10 Essential (primary) hypertension: Secondary | ICD-10-CM

## 2020-09-13 DIAGNOSIS — E782 Mixed hyperlipidemia: Secondary | ICD-10-CM

## 2020-09-14 ENCOUNTER — Encounter: Payer: Self-pay | Admitting: Nurse Practitioner

## 2020-09-14 ENCOUNTER — Other Ambulatory Visit: Payer: Self-pay

## 2020-09-14 ENCOUNTER — Ambulatory Visit (INDEPENDENT_AMBULATORY_CARE_PROVIDER_SITE_OTHER): Payer: Medicare Other | Admitting: Nurse Practitioner

## 2020-09-14 VITALS — BP 139/87 | HR 74 | Temp 97.6°F | Ht 65.0 in | Wt 173.6 lb

## 2020-09-14 DIAGNOSIS — M8580 Other specified disorders of bone density and structure, unspecified site: Secondary | ICD-10-CM

## 2020-09-14 DIAGNOSIS — F17200 Nicotine dependence, unspecified, uncomplicated: Secondary | ICD-10-CM

## 2020-09-14 DIAGNOSIS — E782 Mixed hyperlipidemia: Secondary | ICD-10-CM | POA: Diagnosis not present

## 2020-09-14 DIAGNOSIS — I10 Essential (primary) hypertension: Secondary | ICD-10-CM | POA: Diagnosis not present

## 2020-09-14 DIAGNOSIS — L4050 Arthropathic psoriasis, unspecified: Secondary | ICD-10-CM

## 2020-09-14 DIAGNOSIS — F3342 Major depressive disorder, recurrent, in full remission: Secondary | ICD-10-CM

## 2020-09-14 DIAGNOSIS — Z6829 Body mass index (BMI) 29.0-29.9, adult: Secondary | ICD-10-CM

## 2020-09-14 MED ORDER — ESCITALOPRAM OXALATE 20 MG PO TABS: 20.0000 mg | ORAL_TABLET | Freq: Every day | ORAL | 1 refills | Status: DC

## 2020-09-14 MED ORDER — ATORVASTATIN CALCIUM 40 MG PO TABS: 40.0000 mg | ORAL_TABLET | Freq: Every day | ORAL | 1 refills | Status: DC

## 2020-09-14 MED ORDER — LISINOPRIL-HYDROCHLOROTHIAZIDE 20-12.5 MG PO TABS: 1.0000 | ORAL_TABLET | Freq: Every day | ORAL | 1 refills | Status: DC

## 2020-09-14 MED ORDER — FENOFIBRATE 160 MG PO TABS: 160.0000 mg | ORAL_TABLET | Freq: Every day | ORAL | 1 refills | Status: DC

## 2020-09-14 NOTE — Progress Notes (Signed)
Subjective:    Patient ID: Lisa Ball, female    DOB: 11/01/1960, 59 y.o.   MRN: [id number]   Chief Complaint: Medical Management of Chronic Issues    HPI:  1. Hypertension, unspecified type No c/o chest pain, sob or headaches. Does check blood pressure at home occasionally.  BP Readings from Last 3 Encounters:  09/14/20 139/87  04/13/19 108/70  10/22/18 125/81     2. Mixed hyperlipidemia Doe sot watch diet and does no dedicated exercise Lab Results  Component Value Date   CHOL 128 10/27/2019   HDL 24 (L) 10/27/2019   LDLCALC 69 10/27/2019   TRIG 208 (H) 10/27/2019   CHOLHDL 5.3 (H) 10/27/2019     3. Psoriatic arthritis ([place]) Stable for now. She is now on [place] and is doing well.  4. Osteopenia, unspecified location Last dexascan was done on 12/24/19 t score was -0.1. she does no weight bearing exercises  5. Recurrent major depressive disorder, in full remission [facility]) She is on lexapro and says she is dong well without side effects Depression screen [facility] 2/9 09/14/2020 04/12/2020 10/14/2019  Decreased Interest 0 0 0  Down, Depressed, Hopeless 0 0 0  PHQ - 2 Score 0 0 0  Altered sleeping 0 - -  Tired, decreased energy 0 - -  Change in appetite 0 - -  Feeling bad or failure about yourself  0 - -  Trouble concentrating 0 - -  Moving slowly or fidgety/restless 0 - -  Suicidal thoughts 0 - -  PHQ-9 Score 0 - -  Difficult doing work/chores Not difficult at all - -     6. Smoker Smokes over a pack a day  7. BMI 29.0-29.9,adult No recent weight changes Wt Readings from Last 3 Encounters:  09/14/20 173 lb 9.6 oz (78.7 kg)  04/13/19 162 lb (73.5 kg)  10/22/18 163 lb 12.8 oz (74.3 kg)   BMI Readings from Last 3 Encounters:  09/14/20 28.89 kg/m  04/13/19 26.96 kg/m  10/22/18 27.26 kg/m       Outpatient Encounter Medications as of 09/14/2020  Medication Sig  . albuterol (PROVENTIL HFA;VENTOLIN HFA) 108 (90 Base) MCG/ACT inhaler Inhale 2 puffs into  the lungs every 6 (six) hours as needed for wheezing or shortness of breath.  Marland Kitchen atorvastatin (LIPITOR) 40 MG tablet TAKE 1 TABLET (40 MG TOTAL) BY MOUTH DAILY. (NEEDS TO BE SEEN BEFORE NEXT REFILL)  . clobetasol cream (TEMOVATE) 5.99 % Apply 1 application topically 2 (two) times daily.  Marland Kitchen escitalopram (LEXAPRO) 20 MG tablet TAKE 1 TABLET (20 MG TOTAL) BY MOUTH DAILY. (NEEDS TO BE SEEN BEFORE NEXT REFILL)  . fenofibrate 160 MG tablet Take 1 tablet (160 mg total) by mouth daily.  Marland Kitchen lisinopril-hydrochlorothiazide (ZESTORETIC) 20-12.5 MG tablet TAKE 1 TABLET BY MOUTH DAILY. (NEEDS TO BE SEEN BEFORE NEXT REFILL)  . meloxicam (MOBIC) 15 MG tablet Take 15 mg by mouth daily.  . Tofacitinib Citrate 5 MG TABS Take by mouth.  . [DISCONTINUED] azithromycin (ZITHROMAX Z-PAK) 250 MG tablet As directed  . [DISCONTINUED] benzonatate (TESSALON PERLES) 100 MG capsule Take 1 capsule (100 mg total) by mouth 3 (three) times daily as needed for cough.   No facility-administered encounter medications on file as of 09/14/2020.    Past Surgical History:  Procedure Laterality Date  . CHOLECYSTECTOMY    . TUBAL LIGATION      Family History  Problem Relation Age of Onset  . Heart disease Mother   . Diabetes Sister   .  Cancer Father 32       lung cancer-died at age 83  . Seizures Maternal Grandmother   . Heart disease Maternal Grandfather   . Seizures Paternal Grandmother   . Heart disease Paternal Grandfather   . Colon cancer Neg Hx   . Esophageal cancer Neg Hx   . Rectal cancer Neg Hx   . Stomach cancer Neg Hx     New complaints: None today  Social history: Lives with nephew  Controlled substance contract: n/a    Review of Systems  Constitutional: Negative for diaphoresis.  Eyes: Negative for pain.  Respiratory: Negative for shortness of breath.   Cardiovascular: Negative for chest pain, palpitations and leg swelling.  Gastrointestinal: Negative for abdominal pain.  Endocrine: Negative for  polydipsia.  Skin: Negative for rash.  Neurological: Negative for dizziness, weakness and headaches.  Hematological: Does not bruise/bleed easily.  All other systems reviewed and are negative.      Objective:   Physical Exam Vitals and nursing note reviewed.  Constitutional:      General: She is not in acute distress.    Appearance: Normal appearance. She is well-developed and well-nourished.  HENT:     Head: Normocephalic.     Nose: Nose normal.     Mouth/Throat:     Mouth: Oropharynx is clear and moist.  Eyes:     Extraocular Movements: EOM normal.     Pupils: Pupils are equal, round, and reactive to light.  Neck:     Vascular: No carotid bruit or JVD.  Cardiovascular:     Rate and Rhythm: Normal rate and regular rhythm.     Pulses: Intact distal pulses.     Heart sounds: Normal heart sounds.  Pulmonary:     Effort: Pulmonary effort is normal. No respiratory distress.     Breath sounds: Normal breath sounds. No wheezing or rales.  Chest:     Chest wall: No tenderness.  Abdominal:     General: Bowel sounds are normal. There is no distension or abdominal bruit. Aorta is normal.     Palpations: Abdomen is soft. There is no hepatomegaly, splenomegaly, mass or pulsatile mass.     Tenderness: There is no abdominal tenderness.  Musculoskeletal:        General: No edema. Normal range of motion.     Cervical back: Normal range of motion and neck supple.  Lymphadenopathy:     Cervical: No cervical adenopathy.  Skin:    General: Skin is warm and dry.  Neurological:     Mental Status: She is alert and oriented to person, place, and time.     Deep Tendon Reflexes: Reflexes are normal and symmetric.  Psychiatric:        Mood and Affect: Mood and affect normal.        Behavior: Behavior normal.        Thought Content: Thought content normal.        Judgment: Judgment normal.    BP 139/87   Pulse 74   Temp 97.6 F (36.4 C) (Temporal)   Ht '5\' 5"'  (1.651 m)   Wt 173 lb 9.6  oz (78.7 kg)   SpO2 95%   BMI 28.89 kg/m         Assessment & Plan:  JENNIGER FIGIEL comes in today with chief complaint of Medical Management of Chronic Issues   Diagnosis and orders addressed:  1. Hypertension, unspecified type Low sodium diet - lisinopril-hydrochlorothiazide (ZESTORETIC) 20-12.5 MG tablet; Take 1  tablet by mouth daily. (Needs to be seen before next refill)  Dispense: 90 tablet; Refill: 1 - CBC with Differential/Platelet - CMP14+EGFR  2. Mixed hyperlipidemia Low fat diet - fenofibrate 160 MG tablet; Take 1 tablet (160 mg total) by mouth daily.  Dispense: 90 tablet; Refill: 1 - atorvastatin (LIPITOR) 40 MG tablet; Take 1 tablet (40 mg total) by mouth daily. (Needs to be seen before next refill)  Dispense: 90 tablet; Refill: 1 - Lipid panel  3. Psoriatic arthritis ([place]) Keep follow up with rheumatology  4. Osteopenia, unspecified location Weight bearing exercise  5. Recurrent major depressive disorder, in full remission ([place]) Stress management - escitalopram (LEXAPRO) 20 MG tablet; Take 1 tablet (20 mg total) by mouth daily. (Needs to be seen before next refill)  Dispense: 90 tablet; Refill: 1  6. Smoker Smoking cessation encouraged  7. BMI 29.0-29.9,adult Discussed diet and exercise for person with BMI >25 Will recheck weight in 3-6 months   Labs pending Health Maintenance reviewed Diet and exercise encouraged  Follow up plan: 6 months    Mary-Margaret Hassell Done, FNP

## 2020-09-14 NOTE — Patient Instructions (Signed)
Steps to Quit Smoking Smoking tobacco is the leading cause of preventable death. It can affect almost every organ in the body. Smoking puts you and people around you at risk for many serious, long-lasting (chronic) diseases. Quitting smoking can be hard, but it is one of the best things that you can do for your health. It is never too late to quit. How do I get ready to quit? When you decide to quit smoking, make a plan to help you succeed. Before you quit:  Pick a date to quit. Set a date within the next 2 weeks to give you time to prepare.  Write down the reasons why you are quitting. Keep this list in places where you will see it often.  Tell your family, friends, and co-workers that you are quitting. Their support is important.  Talk with your doctor about the choices that may help you quit.  Find out if your health insurance will pay for these treatments.  Know the people, places, things, and activities that make you want to smoke (triggers). Avoid them. What first steps can I take to quit smoking?  Throw away all cigarettes at home, at work, and in your car.  Throw away the things that you use when you smoke, such as ashtrays and lighters.  Clean your car. Make sure to empty the ashtray.  Clean your home, including curtains and carpets. What can I do to help me quit smoking? Talk with your doctor about taking medicines and seeing a counselor at the same time. You are more likely to succeed when you do both.  If you are pregnant or breastfeeding, talk with your doctor about counseling or other ways to quit smoking. Do not take medicine to help you quit smoking unless your doctor tells you to do so. To quit smoking: Quit right away  Quit smoking totally, instead of slowly cutting back on how much you smoke over a period of time.  Go to counseling. You are more likely to quit if you go to counseling sessions regularly. Take medicine You may take medicines to help you quit. Some  medicines need a prescription, and some you can buy over-the-counter. Some medicines may contain a drug called nicotine to replace the nicotine in cigarettes. Medicines may:  Help you to stop having the desire to smoke (cravings).  Help to stop the problems that come when you stop smoking (withdrawal symptoms). Your doctor may ask you to use:  Nicotine patches, gum, or lozenges.  Nicotine inhalers or sprays.  Non-nicotine medicine that is taken by mouth. Find resources Find resources and other ways to help you quit smoking and remain smoke-free after you quit. These resources are most helpful when you use them often. They include:  Online chats with a counselor.  Phone quitlines.  Printed self-help materials.  Support groups or group counseling.  Text messaging programs.  Mobile phone apps. Use apps on your mobile phone or tablet that can help you stick to your quit plan. There are many free apps for mobile phones and tablets as well as websites. Examples include Quit Guide from the CDC and smokefree.gov  What things can I do to make it easier to quit?   Talk to your family and friends. Ask them to support and encourage you.  Call a phone quitline (1-800-QUIT-NOW), reach out to support groups, or work with a counselor.  Ask people who smoke to not smoke around you.  Avoid places that make you want to smoke,   such as: ? Bars. ? Parties. ? Smoke-break areas at work.  Spend time with people who do not smoke.  Lower the stress in your life. Stress can make you want to smoke. Try these things to help your stress: ? Getting regular exercise. ? Doing deep-breathing exercises. ? Doing yoga. ? Meditating. ? Doing a body scan. To do this, close your eyes, focus on one area of your body at a time from head to toe. Notice which parts of your body are tense. Try to relax the muscles in those areas. How will I feel when I quit smoking? Day 1 to 3 weeks Within the first 24 hours,  you may start to have some problems that come from quitting tobacco. These problems are very bad 2-3 days after you quit, but they do not often last for more than 2-3 weeks. You may get these symptoms:  Mood swings.  Feeling restless, nervous, angry, or annoyed.  Trouble concentrating.  Dizziness.  Strong desire for high-sugar foods and nicotine.  Weight gain.  Trouble pooping (constipation).  Feeling like you may vomit (nausea).  Coughing or a sore throat.  Changes in how the medicines that you take for other issues work in your body.  Depression.  Trouble sleeping (insomnia). Week 3 and afterward After the first 2-3 weeks of quitting, you may start to notice more positive results, such as:  Better sense of smell and taste.  Less coughing and sore throat.  Slower heart rate.  Lower blood pressure.  Clearer skin.  Better breathing.  Fewer sick days. Quitting smoking can be hard. Do not give up if you fail the first time. Some people need to try a few times before they succeed. Do your best to stick to your quit plan, and talk with your doctor if you have any questions or concerns. Summary  Smoking tobacco is the leading cause of preventable death. Quitting smoking can be hard, but it is one of the best things that you can do for your health.  When you decide to quit smoking, make a plan to help you succeed.  Quit smoking right away, not slowly over a period of time.  When you start quitting, seek help from your doctor, family, or friends. This information is not intended to replace advice given to you by your health care provider. Make sure you discuss any questions you have with your health care provider. Document Revised: 05/28/2019 Document Reviewed: 11/21/2018 Elsevier Patient Education  2020 Elsevier Inc.  

## 2020-09-15 LAB — CMP14+EGFR
ALT: 25 IU/L (ref 0–32)
AST: 28 IU/L (ref 0–40)
Albumin/Globulin Ratio: 1.4 (ref 1.2–2.2)
Albumin: 4.2 g/dL (ref 3.8–4.9)
Alkaline Phosphatase: 53 IU/L (ref 44–121)
BUN/Creatinine Ratio: 10 (ref 9–23)
BUN: 7 mg/dL (ref 6–24)
Bilirubin Total: 0.4 mg/dL (ref 0.0–1.2)
CO2: 27 mmol/L (ref 20–29)
Calcium: 9.6 mg/dL (ref 8.7–10.2)
Chloride: 101 mmol/L (ref 96–106)
Creatinine, Ser: 0.68 mg/dL (ref 0.57–1.00)
GFR calc Af Amer: 111 mL/min/{1.73_m2} (ref >59)
GFR calc non Af Amer: 96 mL/min/{1.73_m2} (ref >59)
Globulin, Total: 3 g/dL (ref 1.5–4.5)
Glucose: 77 mg/dL (ref 65–99)
Potassium: 3.5 mmol/L (ref 3.5–5.2)
Sodium: 138 mmol/L (ref 134–144)
Total Protein: 7.2 g/dL (ref 6.0–8.5)

## 2020-09-15 LAB — CBC WITH DIFFERENTIAL/PLATELET
Basophils Absolute: 0.1 10*3/uL (ref 0.0–0.2)
Basos: 1 %
EOS (ABSOLUTE): 0.1 10*3/uL (ref 0.0–0.4)
Eos: 2 %
Hematocrit: 44.1 % (ref 34.0–46.6)
Hemoglobin: 14.9 g/dL (ref 11.1–15.9)
Immature Grans (Abs): 0 10*3/uL (ref 0.0–0.1)
Immature Granulocytes: 0 %
Lymphocytes Absolute: 2.5 10*3/uL (ref 0.7–3.1)
Lymphs: 36 %
MCH: 31.6 pg (ref 26.6–33.0)
MCHC: 33.8 g/dL (ref 31.5–35.7)
MCV: 94 fL (ref 79–97)
Monocytes Absolute: 0.7 10*3/uL (ref 0.1–0.9)
Monocytes: 10 %
Neutrophils Absolute: 3.6 10*3/uL (ref 1.4–7.0)
Neutrophils: 51 %
Platelets: 315 10*3/uL (ref 150–450)
RBC: 4.71 x10E6/uL (ref 3.77–5.28)
RDW: 12.4 % (ref 11.7–15.4)
WBC: 7 10*3/uL (ref 3.4–10.8)

## 2020-09-15 LAB — LIPID PANEL
Chol/HDL Ratio: 6.5 ratio — ABNORMAL HIGH (ref 0.0–4.4)
Cholesterol, Total: 150 mg/dL (ref 100–199)
HDL: 23 mg/dL — ABNORMAL LOW (ref >39)
LDL Chol Calc (NIH): 74 mg/dL (ref 0–99)
Triglycerides: 328 mg/dL — ABNORMAL HIGH (ref 0–149)
VLDL Cholesterol Cal: 53 mg/dL — ABNORMAL HIGH (ref 5–40)

## 2021-02-22 ENCOUNTER — Other Ambulatory Visit: Payer: Self-pay | Admitting: Nurse Practitioner

## 2021-02-22 DIAGNOSIS — Z1231 Encounter for screening mammogram for malignant neoplasm of breast: Secondary | ICD-10-CM

## 2021-03-11 ENCOUNTER — Other Ambulatory Visit: Payer: Self-pay | Admitting: Nurse Practitioner

## 2021-03-11 DIAGNOSIS — I10 Essential (primary) hypertension: Secondary | ICD-10-CM

## 2021-03-11 DIAGNOSIS — F3342 Major depressive disorder, recurrent, in full remission: Secondary | ICD-10-CM

## 2021-03-11 DIAGNOSIS — E782 Mixed hyperlipidemia: Secondary | ICD-10-CM

## 2021-03-15 ENCOUNTER — Other Ambulatory Visit (HOSPITAL_COMMUNITY)
Admission: RE | Admit: 2021-03-15 | Discharge: 2021-03-15 | Disposition: A | Discharge status: Home / Self Care | Payer: Medicare Other | Source: Ambulatory Visit | Attending: Nurse Practitioner | Admitting: Nurse Practitioner

## 2021-03-15 ENCOUNTER — Other Ambulatory Visit: Payer: Self-pay

## 2021-03-15 ENCOUNTER — Encounter: Payer: Self-pay | Admitting: Nurse Practitioner

## 2021-03-15 ENCOUNTER — Ambulatory Visit (INDEPENDENT_AMBULATORY_CARE_PROVIDER_SITE_OTHER): Payer: Medicare Other | Admitting: Nurse Practitioner

## 2021-03-15 VITALS — BP 113/77 | HR 75 | Temp 97.6°F | Resp 20 | Ht 65.0 in | Wt 171.0 lb

## 2021-03-15 DIAGNOSIS — Z23 Encounter for immunization: Secondary | ICD-10-CM

## 2021-03-15 DIAGNOSIS — I10 Essential (primary) hypertension: Secondary | ICD-10-CM

## 2021-03-15 DIAGNOSIS — Z0000 Encounter for general adult medical examination without abnormal findings: Secondary | ICD-10-CM | POA: Diagnosis not present

## 2021-03-15 DIAGNOSIS — Z6829 Body mass index (BMI) 29.0-29.9, adult: Secondary | ICD-10-CM

## 2021-03-15 DIAGNOSIS — Z0001 Encounter for general adult medical examination with abnormal findings: Secondary | ICD-10-CM | POA: Diagnosis not present

## 2021-03-15 DIAGNOSIS — E782 Mixed hyperlipidemia: Secondary | ICD-10-CM | POA: Diagnosis not present

## 2021-03-15 DIAGNOSIS — Z01419 Encounter for gynecological examination (general) (routine) without abnormal findings: Secondary | ICD-10-CM | POA: Diagnosis present

## 2021-03-15 DIAGNOSIS — F3342 Major depressive disorder, recurrent, in full remission: Secondary | ICD-10-CM

## 2021-03-15 DIAGNOSIS — L4050 Arthropathic psoriasis, unspecified: Secondary | ICD-10-CM

## 2021-03-15 DIAGNOSIS — F17200 Nicotine dependence, unspecified, uncomplicated: Secondary | ICD-10-CM

## 2021-03-15 DIAGNOSIS — M8580 Other specified disorders of bone density and structure, unspecified site: Secondary | ICD-10-CM

## 2021-03-15 LAB — URINALYSIS, COMPLETE
Bilirubin, UA: NEGATIVE
Glucose, UA: NEGATIVE
Ketones, UA: NEGATIVE
Leukocytes,UA: NEGATIVE
Nitrite, UA: NEGATIVE
Protein,UA: NEGATIVE
Specific Gravity, UA: 1.015 (ref 1.005–1.030)
Urobilinogen, Ur: 1 mg/dL (ref 0.2–1.0)
pH, UA: 6 (ref 5.0–7.5)

## 2021-03-15 LAB — MICROSCOPIC EXAMINATION
Bacteria, UA: NONE SEEN
RBC, Urine: NONE SEEN /hpf (ref 0–2)

## 2021-03-15 MED ORDER — ESCITALOPRAM OXALATE 20 MG PO TABS: 20.0000 mg | ORAL_TABLET | Freq: Every day | ORAL | 1 refills | Status: DC

## 2021-03-15 MED ORDER — LISINOPRIL-HYDROCHLOROTHIAZIDE 20-12.5 MG PO TABS: 1.0000 | ORAL_TABLET | Freq: Every day | ORAL | 1 refills | Status: DC

## 2021-03-15 NOTE — Patient Instructions (Signed)
https://www.nhlbi.nih.gov/files/docs/public/heart/dash_brief.pdf">  DASH Eating Plan DASH stands for Dietary Approaches to Stop Hypertension. The DASH eating plan is a healthy eating plan that has been shown to: Reduce high blood pressure (hypertension). Reduce your risk for type 2 diabetes, heart disease, and stroke. Help with weight loss. What are tips for following this plan? Reading food labels Check food labels for the amount of salt (sodium) per serving. Choose foods with less than 5 percent of the Daily Value of sodium. Generally, foods with less than 300 milligrams (mg) of sodium per serving fit into this eating plan. To find whole grains, look for the word "whole" as the first word in the ingredient list. Shopping Buy products labeled as "low-sodium" or "no salt added." Buy fresh foods. Avoid canned foods and pre-made or frozen meals. Cooking Avoid adding salt when cooking. Use salt-free seasonings or herbs instead of table salt or sea salt. Check with your health care provider or pharmacist before using salt substitutes. Do not fry foods. Cook foods using healthy methods such as baking, boiling, grilling, roasting, and broiling instead. Cook with heart-healthy oils, such as olive, canola, avocado, soybean, or sunflower oil. Meal planning  Eat a balanced diet that includes: 4 or more servings of fruits and 4 or more servings of vegetables each day. Try to fill one-half of your plate with fruits and vegetables. 6-8 servings of whole grains each day. Less than 6 oz (170 g) of lean meat, poultry, or fish each day. A 3-oz (85-g) serving of meat is about the same size as a deck of cards. One egg equals 1 oz (28 g). 2-3 servings of low-fat dairy each day. One serving is 1 cup (237 mL). 1 serving of nuts, seeds, or beans 5 times each week. 2-3 servings of heart-healthy fats. Healthy fats called omega-3 fatty acids are found in foods such as walnuts, flaxseeds, fortified milks, and eggs.  These fats are also found in cold-water fish, such as sardines, salmon, and mackerel. Limit how much you eat of: Canned or prepackaged foods. Food that is high in trans fat, such as some fried foods. Food that is high in saturated fat, such as fatty meat. Desserts and other sweets, sugary drinks, and other foods with added sugar. Full-fat dairy products. Do not salt foods before eating. Do not eat more than 4 egg yolks a week. Try to eat at least 2 vegetarian meals a week. Eat more home-cooked food and less restaurant, buffet, and fast food.  Lifestyle When eating at a restaurant, ask that your food be prepared with less salt or no salt, if possible. If you drink alcohol: Limit how much you use to: 0-1 drink a day for women who are not pregnant. 0-2 drinks a day for men. Be aware of how much alcohol is in your drink. In the [place] one drink equals one 12 oz bottle of beer (355 mL), one 5 oz glass of wine (148 mL), or one 1 oz glass of hard liquor (44 mL). General information Avoid eating more than 2,300 mg of salt a day. If you have hypertension, you may need to reduce your sodium intake to 1,500 mg a day. Work with your health care provider to maintain a healthy body weight or to lose weight. Ask what an ideal weight is for you. Get at least 30 minutes of exercise that causes your heart to beat faster (aerobic exercise) most days of the week. Activities may include walking, swimming, or biking. Work with your health care provider   or dietitian to adjust your eating plan to your individual calorie needs. What foods should I eat? Fruits All fresh, dried, or frozen fruit. Canned fruit in natural juice (without addedsugar). Vegetables Fresh or frozen vegetables (raw, steamed, roasted, or grilled). Low-sodium or reduced-sodium tomato and vegetable juice. Low-sodium or reduced-sodium tomatosauce and tomato paste. Low-sodium or reduced-sodium canned vegetables. Grains Whole-grain or  whole-wheat bread. Whole-grain or whole-wheat pasta. Brown rice. Oatmeal. Quinoa. Bulgur. Whole-grain and low-sodium cereals. Pita bread.Low-fat, low-sodium crackers. Whole-wheat flour tortillas. Meats and other proteins Skinless chicken or turkey. Ground chicken or turkey. Pork with fat trimmed off. Fish and seafood. Egg whites. Dried beans, peas, or lentils. Unsalted nuts, nut butters, and seeds. Unsalted canned beans. Lean cuts of beef with fat trimmed off. Low-sodium, lean precooked or cured meat, such as sausages or meatloaves. Dairy Low-fat (1%) or fat-free (skim) milk. Reduced-fat, low-fat, or fat-free cheeses. Nonfat, low-sodium ricotta or cottage cheese. Low-fat or nonfatyogurt. Low-fat, low-sodium cheese. Fats and oils Soft margarine without trans fats. Vegetable oil. Reduced-fat, low-fat, or light mayonnaise and salad dressings (reduced-sodium). Canola, safflower, olive, avocado, soybean, andsunflower oils. Avocado. Seasonings and condiments Herbs. Spices. Seasoning mixes without salt. Other foods Unsalted popcorn and pretzels. Fat-free sweets. The items listed above may not be a complete list of foods and beverages you can eat. Contact a dietitian for more information. What foods should I avoid? Fruits Canned fruit in a light or heavy syrup. Fried fruit. Fruit in cream or buttersauce. Vegetables Creamed or fried vegetables. Vegetables in a cheese sauce. Regular canned vegetables (not low-sodium or reduced-sodium). Regular canned tomato sauce and paste (not low-sodium or reduced-sodium). Regular tomato and vegetable juice(not low-sodium or reduced-sodium). Pickles. Olives. Grains Baked goods made with fat, such as croissants, muffins, or some breads. Drypasta or rice meal packs. Meats and other proteins Fatty cuts of meat. Ribs. Fried meat. Bacon. Bologna, salami, and other precooked or cured meats, such as sausages or meat loaves. Fat from the back of a pig (fatback). Bratwurst.  Salted nuts and seeds. Canned beans with added salt. Canned orsmoked fish. Whole eggs or egg yolks. Chicken or turkey with skin. Dairy Whole or 2% milk, cream, and half-and-half. Whole or full-fat cream cheese. Whole-fat or sweetened yogurt. Full-fat cheese. Nondairy creamers. Whippedtoppings. Processed cheese and cheese spreads. Fats and oils Butter. Stick margarine. Lard. Shortening. Ghee. Bacon fat. Tropical oils, suchas coconut, palm kernel, or palm oil. Seasonings and condiments Onion salt, garlic salt, seasoned salt, table salt, and sea salt. Worcestershire sauce. Tartar sauce. Barbecue sauce. Teriyaki sauce. Soy sauce, including reduced-sodium. Steak sauce. Canned and packaged gravies. Fish sauce. Oyster sauce. Cocktail sauce. Store-bought horseradish. Ketchup. Mustard. Meat flavorings and tenderizers. Bouillon cubes. Hot sauces. Pre-made or packaged marinades. Pre-made or packaged taco seasonings. Relishes. Regular saladdressings. Other foods Salted popcorn and pretzels. The items listed above may not be a complete list of foods and beverages you should avoid. Contact a dietitian for more information. Where to find more information National Heart, Lung, and Blood Institute: www.nhlbi.nih.gov American Heart Association: www.heart.org Academy of Nutrition and Dietetics: www.eatright.org National Kidney Foundation: www.kidney.org Summary The DASH eating plan is a healthy eating plan that has been shown to reduce high blood pressure (hypertension). It may also reduce your risk for type 2 diabetes, heart disease, and stroke. When on the DASH eating plan, aim to eat more fresh fruits and vegetables, whole grains, lean proteins, low-fat dairy, and heart-healthy fats. With the DASH eating plan, you should limit salt (sodium) intake to 2,300   mg a day. If you have hypertension, you may need to reduce your sodium intake to 1,500 mg a day. Work with your health care provider or dietitian to adjust  your eating plan to your individual calorie needs. This information is not intended to replace advice given to you by your health care provider. Make sure you discuss any questions you have with your healthcare provider. Document Revised: 08/06/2019 Document Reviewed: 08/06/2019 Elsevier Patient Education  2022 Elsevier Inc.  

## 2021-03-15 NOTE — Progress Notes (Signed)
Subjective:    Patient ID: Lisa Ball, female    DOB: Jan 31, 1961, 60 y.o.   MRN: [id number]   Chief Complaint: Annual Exam    HPI:  1. Annual physical exam Having PAP today- denies pelvic pain and no bleeding  2. Hypertension, unspecified type No c/o chest pain, sob or headache. Does not check blood pressure at home. BP Readings from Last 3 Encounters:  03/15/21 113/77  09/14/20 139/87  04/13/19 108/70     3. Mixed hyperlipidemia Does not watch diet and doe sno dedicated exercise. Lab Results  Component Value Date   CHOL 150 09/14/2020   HDL 23 (L) 09/14/2020   LDLCALC 74 09/14/2020   TRIG 328 (H) 09/14/2020   CHOLHDL 6.5 (H) 09/14/2020     4. Psoriatic arthritis ([place]) Is on tofacitnib and s doing well. Sees rheumatologist every 6 months.  5. Recurrent major depressive disorder, in full remission ([place]) Is on lexapro daily and is doing well. Depression screen [facility] 2/9 03/15/2021 09/14/2020 04/12/2020  Decreased Interest 0 0 0  Down, Depressed, Hopeless 0 0 0  PHQ - 2 Score 0 0 0  Altered sleeping 0 0 -  Tired, decreased energy 0 0 -  Change in appetite 0 0 -  Feeling bad or failure about yourself  0 0 -  Trouble concentrating 0 0 -  Moving slowly or fidgety/restless 0 0 -  Suicidal thoughts 0 0 -  PHQ-9 Score 0 0 -  Difficult doing work/chores Not difficult at all Not difficult at all -     6. Osteopenia, unspecified location Last dexascan was done 12/23/17 with t score of -0.1 which was considered normal at that time.  7. Smoker Smoke over a pck a day. Has not been able to quit  8. BMI 29.0-29.9,adult No recent weight changes Wt Readings from Last 3 Encounters:  03/15/21 171 lb (77.6 kg)  09/14/20 173 lb 9.6 oz (78.7 kg)  04/13/19 162 lb (73.5 kg)   BMI Readings from Last 3 Encounters:  03/15/21 28.46 kg/m  09/14/20 28.89 kg/m  04/13/19 26.96 kg/m       Outpatient Encounter Medications as of 03/15/2021  Medication Sig   albuterol  (PROVENTIL HFA;VENTOLIN HFA) 108 (90 Base) MCG/ACT inhaler Inhale 2 puffs into the lungs every 6 (six) hours as needed for wheezing or shortness of breath.   atorvastatin (LIPITOR) 40 MG tablet Take 1 tablet (40 mg total) by mouth daily.   clobetasol cream (TEMOVATE) 4.09 % Apply 1 application topically 2 (two) times daily.   escitalopram (LEXAPRO) 20 MG tablet Take 1 tablet (20 mg total) by mouth daily.   fenofibrate 160 MG tablet Take 1 tablet (160 mg total) by mouth daily.   lisinopril-hydrochlorothiazide (ZESTORETIC) 20-12.5 MG tablet Take 1 tablet by mouth daily.   meloxicam (MOBIC) 15 MG tablet Take 15 mg by mouth daily.   Tofacitinib Citrate 5 MG TABS Take by mouth.   No facility-administered encounter medications on file as of 03/15/2021.    Past Surgical History:  Procedure Laterality Date   CHOLECYSTECTOMY     TUBAL LIGATION      Family History  Problem Relation Age of Onset   Heart disease Mother    Diabetes Sister    Cancer Father 28       lung cancer-died at age 75   Seizures Maternal Grandmother    Heart disease Maternal Grandfather    Seizures Paternal Grandmother    Heart disease Paternal Grandfather  Colon cancer Neg Hx    Esophageal cancer Neg Hx    Rectal cancer Neg Hx    Stomach cancer Neg Hx     New complaints: None today  Social history: Lives with her nephew and takes care of him.  Controlled substance contract: n/a     Review of Systems  Constitutional:  Negative for diaphoresis.  Eyes:  Negative for pain.  Respiratory:  Negative for shortness of breath.   Cardiovascular:  Negative for chest pain, palpitations and leg swelling.  Gastrointestinal:  Negative for abdominal pain.  Endocrine: Negative for polydipsia.  Skin:  Negative for rash.  Neurological:  Negative for dizziness, weakness and headaches.  Hematological:  Does not bruise/bleed easily.  All other systems reviewed and are negative.     Objective:   Physical Exam Vitals  and nursing note reviewed.  Constitutional:      General: She is not in acute distress.    Appearance: Normal appearance. She is well-developed.  HENT:     Head: Normocephalic.     Right Ear: Tympanic membrane normal.     Left Ear: Tympanic membrane normal.     Nose: Nose normal.     Mouth/Throat:     Mouth: Mucous membranes are moist.  Eyes:     Pupils: Pupils are equal, round, and reactive to light.  Neck:     Vascular: No carotid bruit or JVD.  Cardiovascular:     Rate and Rhythm: Normal rate and regular rhythm.     Heart sounds: Normal heart sounds.  Pulmonary:     Effort: Pulmonary effort is normal. No respiratory distress.     Breath sounds: Normal breath sounds. No wheezing or rales.  Chest:     Chest wall: No tenderness.  Abdominal:     General: Bowel sounds are normal. There is no distension or abdominal bruit.     Palpations: Abdomen is soft. There is no hepatomegaly, splenomegaly, mass or pulsatile mass.     Tenderness: There is no abdominal tenderness.  Genitourinary:    General: Normal vulva.     Vagina: No vaginal discharge.     Rectum: Normal.     Comments: cervix parous and pink No adnexal masses or tenderness Musculoskeletal:        General: Normal range of motion.     Cervical back: Normal range of motion and neck supple.  Lymphadenopathy:     Cervical: No cervical adenopathy.  Skin:    General: Skin is warm and dry.  Neurological:     Mental Status: She is alert and oriented to person, place, and time.     Deep Tendon Reflexes: Reflexes are normal and symmetric.  Psychiatric:        Behavior: Behavior normal.        Thought Content: Thought content normal.        Judgment: Judgment normal.   BP 113/77   Pulse 75   Temp 97.6 F (36.4 C) (Temporal)   Resp 20   Ht '5\' 5"'  (1.651 m)   Wt 171 lb (77.6 kg)   SpO2 91%   BMI 28.46 kg/m         Assessment & Plan:   SHELBI VACCARO comes in today with chief complaint of Annual  Exam   Diagnosis and orders addressed:  1. Annual physical exam  - Urinalysis, Complete - CBC with Differential/Platelet - Cytology - PAP  2. Hypertension, unspecified type Low sodium diet - lisinopril-hydrochlorothiazide (ZESTORETIC) 20-12.5 MG  tablet; Take 1 tablet by mouth daily.  Dispense: 90 tablet; Refill: 1 - CMP14+EGFR  3. Mixed hyperlipidemia Low fat diet - Lipid panel  4. Psoriatic arthritis ([place]) Keep follow up with rheumatology  5. Recurrent major depressive disorder, in full remission ([place]) Stress management - escitalopram (LEXAPRO) 20 MG tablet; Take 1 tablet (20 mg total) by mouth daily.  Dispense: 90 tablet; Refill: 1  6. Osteopenia, unspecified location Weight bearing exercises  7. Smoker Smoking cessation encouraged  8. BMI 29.0-29.9,adult Discussed diet and exercise for person with BMI >25 Will recheck weight in 3-6 months   Labs pending Health Maintenance reviewed Diet and exercise encouraged  Follow up plan: 6 months  Mary-Margaret Hassell Done, FNP

## 2021-03-16 LAB — CMP14+EGFR
ALT: 19 IU/L (ref 0–32)
AST: 25 IU/L (ref 0–40)
Albumin/Globulin Ratio: 1.4 (ref 1.2–2.2)
Albumin: 4.5 g/dL (ref 3.8–4.9)
Alkaline Phosphatase: 57 IU/L (ref 44–121)
BUN/Creatinine Ratio: 7 — ABNORMAL LOW (ref 12–28)
BUN: 6 mg/dL — ABNORMAL LOW (ref 8–27)
Bilirubin Total: 0.4 mg/dL (ref 0.0–1.2)
CO2: 25 mmol/L (ref 20–29)
Calcium: 10.2 mg/dL (ref 8.7–10.3)
Chloride: 97 mmol/L (ref 96–106)
Creatinine, Ser: 0.82 mg/dL (ref 0.57–1.00)
Globulin, Total: 3.2 g/dL (ref 1.5–4.5)
Glucose: 96 mg/dL (ref 65–99)
Potassium: 3.6 mmol/L (ref 3.5–5.2)
Sodium: 137 mmol/L (ref 134–144)
Total Protein: 7.7 g/dL (ref 6.0–8.5)
eGFR: 82 mL/min/{1.73_m2} (ref >59)

## 2021-03-16 LAB — CBC WITH DIFFERENTIAL/PLATELET
Basophils Absolute: 0 10*3/uL (ref 0.0–0.2)
Basos: 1 %
EOS (ABSOLUTE): 0.2 10*3/uL (ref 0.0–0.4)
Eos: 2 %
Hematocrit: 43.5 % (ref 34.0–46.6)
Hemoglobin: 15 g/dL (ref 11.1–15.9)
Immature Grans (Abs): 0 10*3/uL (ref 0.0–0.1)
Immature Granulocytes: 1 %
Lymphocytes Absolute: 3.1 10*3/uL (ref 0.7–3.1)
Lymphs: 36 %
MCH: 31.7 pg (ref 26.6–33.0)
MCHC: 34.5 g/dL (ref 31.5–35.7)
MCV: 92 fL (ref 79–97)
Monocytes Absolute: 0.9 10*3/uL (ref 0.1–0.9)
Monocytes: 10 %
Neutrophils Absolute: 4.3 10*3/uL (ref 1.4–7.0)
Neutrophils: 50 %
Platelets: 383 10*3/uL (ref 150–450)
RBC: 4.73 x10E6/uL (ref 3.77–5.28)
RDW: 13.2 % (ref 11.7–15.4)
WBC: 8.5 10*3/uL (ref 3.4–10.8)

## 2021-03-16 LAB — LIPID PANEL
Chol/HDL Ratio: 5.3 ratio — ABNORMAL HIGH (ref 0.0–4.4)
Cholesterol, Total: 144 mg/dL (ref 100–199)
HDL: 27 mg/dL — ABNORMAL LOW (ref >39)
LDL Chol Calc (NIH): 74 mg/dL (ref 0–99)
Triglycerides: 263 mg/dL — ABNORMAL HIGH (ref 0–149)
VLDL Cholesterol Cal: 43 mg/dL — ABNORMAL HIGH (ref 5–40)

## 2021-03-20 LAB — CYTOLOGY - PAP
Adequacy: ABSENT
Diagnosis: NEGATIVE

## 2021-03-28 ENCOUNTER — Ambulatory Visit (INDEPENDENT_AMBULATORY_CARE_PROVIDER_SITE_OTHER): Payer: Medicare Other | Admitting: Nurse Practitioner

## 2021-03-28 ENCOUNTER — Encounter: Payer: Self-pay | Admitting: Nurse Practitioner

## 2021-03-28 DIAGNOSIS — J029 Acute pharyngitis, unspecified: Secondary | ICD-10-CM

## 2021-03-28 MED ORDER — AZITHROMYCIN 250 MG PO TABS: ORAL_TABLET | ORAL | 0 refills | Status: AC

## 2021-03-28 MED ORDER — DM-GUAIFENESIN ER 30-600 MG PO TB12: 1.0000 | ORAL_TABLET | Freq: Two times a day (BID) | ORAL | 0 refills | Status: DC

## 2021-03-28 NOTE — Assessment & Plan Note (Signed)
Symptoms of cough, sinus drainage, sore throat, fever and nausea in the last 24 to 26 hours. Started patient on Zithromax 250 mg tablets [500 mg tablet by mouth day 1] 250 mg tablet day 2-5.  Guaifenesin cough and congestion.  Patient does not want clinic COVID test today but will do well at home test and follow-up appropriately.

## 2021-03-28 NOTE — Progress Notes (Signed)
   Virtual Visit  Note Due to COVID-19 pandemic this visit was conducted virtually. This visit type was conducted due to national recommendations for restrictions regarding the COVID-19 Pandemic (e.g. social distancing, sheltering in place) in an effort to limit this patient's exposure and mitigate transmission in our community. All issues noted in this document were discussed and addressed.  A physical exam was not performed with this format.  I connected with Lisa Ball on 03/28/21 at 12 PM by telephone and verified that I am speaking with the correct person using two identifiers. Lisa Ball is currently located at home during visit. The provider, Ivy Lynn, NP is located in their office at time of visit.  I discussed the limitations, risks, security and privacy concerns of performing an evaluation and management service by telephone and the availability of in person appointments. I also discussed with the patient that there may be a patient responsible charge related to this service. The patient expressed understanding and agreed to proceed.   History and Present Illness:  Sinusitis This is a new problem. Episode onset: 24 to 36 hours. The problem has been gradually worsening since onset. There has been no fever. The pain is moderate. Associated symptoms include chills, coughing and a sore throat. (Nausea) Past treatments include nothing.     Review of Systems  Constitutional:  Positive for chills.  HENT:  Positive for sore throat.   Respiratory:  Positive for cough.   Cardiovascular: Negative.   All other systems reviewed and are negative.   Observations/Objective: Televisit patient not in distress  Assessment and Plan: Symptoms of cough, sinus drainage, sore throat, fever and nausea in the last 24 to 26 hours. Started patient on Zithromax 250 mg tablets [500 mg tablet by mouth day 1] 250 mg tablet day 2-5.  Guaifenesin cough and congestion.  Patient does not want  clinic COVID test today but will do well at home test and follow-up appropriately. Follow Up Instructions: Follow-up with worsening unresolved symptoms    I discussed the assessment and treatment plan with the patient. The patient was provided an opportunity to ask questions and all were answered. The patient agreed with the plan and demonstrated an understanding of the instructions.   The patient was advised to call back or seek an in-person evaluation if the symptoms worsen or if the condition fails to improve as anticipated.  The above assessment and management plan was discussed with the patient. The patient verbalized understanding of and has agreed to the management plan. Patient is aware to call the clinic if symptoms persist or worsen. Patient is aware when to return to the clinic for a follow-up visit. Patient educated on when it is appropriate to go to the emergency department.   Time call ended: 12:10 PM  I provided 10 minutes of  non face-to-face time during this encounter.    Ivy Lynn, NP

## 2021-04-16 ENCOUNTER — Ambulatory Visit (INDEPENDENT_AMBULATORY_CARE_PROVIDER_SITE_OTHER): Payer: Medicare Other

## 2021-04-16 VITALS — Ht 65.0 in | Wt 171.0 lb

## 2021-04-16 DIAGNOSIS — Z0000 Encounter for general adult medical examination without abnormal findings: Secondary | ICD-10-CM

## 2021-04-16 DIAGNOSIS — Z0001 Encounter for general adult medical examination with abnormal findings: Secondary | ICD-10-CM

## 2021-04-16 DIAGNOSIS — F17200 Nicotine dependence, unspecified, uncomplicated: Secondary | ICD-10-CM

## 2021-04-16 DIAGNOSIS — Z780 Asymptomatic menopausal state: Secondary | ICD-10-CM

## 2021-04-16 DIAGNOSIS — F17210 Nicotine dependence, cigarettes, uncomplicated: Secondary | ICD-10-CM

## 2021-04-16 NOTE — Progress Notes (Signed)
Subjective:   ANISTASIA Ball is a 60 y.o. female who presents for Medicare Annual (Subsequent) preventive examination.  Virtual Visit via Telephone Note  I connected with  [name] on 04/16/21 at  3:30 PM EDT by telephone and verified that I am speaking with the correct person using two identifiers.  Location: Patient: Home Provider: [facility] Persons participating in the virtual visit: patient/Nurse Health Advisor   I discussed the limitations, risks, security and privacy concerns of performing an evaluation and management service by telephone and the availability of in person appointments. The patient expressed understanding and agreed to proceed.  Interactive audio and video telecommunications were attempted between this nurse and patient, however failed, due to patient having technical difficulties OR patient did not have access to video capability.  We continued and completed visit with audio only.  Some vital signs may be absent or patient reported.   Concetta Guion E Annett Boxwell, LPN   Review of Systems     Cardiac Risk Factors include: advanced age (>49mn, >>4women);smoking/ tobacco exposure;sedentary lifestyle;dyslipidemia;hypertension     Objective:    Today's Vitals   04/16/21 1553  Weight: 171 lb (77.6 kg)  Height: '5\' 5"'$  (1.651 m)   Body mass index is 28.46 kg/m.  Advanced Directives 04/16/2021 04/12/2020 03/01/2019  Does Patient Have a Medical Advance Directive? No No No  Would patient like information on creating a medical advance directive? Yes (MAU/Ambulatory/Procedural Areas - Information given) No - Patient declined No - Patient declined    Current Medications (verified) Outpatient Encounter Medications as of 04/16/2021  Medication Sig   albuterol (PROVENTIL HFA;VENTOLIN HFA) 108 (90 Base) MCG/ACT inhaler Inhale 2 puffs into the lungs every 6 (six) hours as needed for wheezing or shortness of breath.   atorvastatin (LIPITOR) 40 MG tablet Take 1 tablet (40 mg total)  by mouth daily.   clobetasol cream (TEMOVATE) 0AB-123456789% Apply 1 application topically 2 (two) times daily.   dextromethorphan-guaiFENesin (MUCINEX DM) 30-600 MG 12hr tablet Take 1 tablet by mouth 2 (two) times daily.   escitalopram (LEXAPRO) 20 MG tablet Take 1 tablet (20 mg total) by mouth daily.   fenofibrate 160 MG tablet Take 1 tablet (160 mg total) by mouth daily.   lisinopril-hydrochlorothiazide (ZESTORETIC) 20-12.5 MG tablet Take 1 tablet by mouth daily.   meloxicam (MOBIC) 15 MG tablet Take 15 mg by mouth daily.   Tofacitinib Citrate 5 MG TABS Take by mouth.   No facility-administered encounter medications on file as of 04/16/2021.    Allergies (verified) Methotrexate derivatives and Penicillins   History: Past Medical History:  Diagnosis Date   Anxiety    Colon polyps    Depression    Hyperlipidemia    Hypertension    Psoriatic arthritis ([name])    Pulmonary fibrosis ([name])    RA (rheumatoid arthritis) ([place]    Past Surgical History:  Procedure Laterality Date   CHOLECYSTECTOMY     TUBAL LIGATION     Family History  Problem Relation Age of Onset   Heart disease Mother    Diabetes Sister    Cancer Father 526      lung cancer-died at age 60  Seizures Maternal Grandmother    Heart disease Maternal Grandfather    Seizures Paternal Grandmother    Heart disease Paternal Grandfather    Colon cancer Neg Hx    Esophageal cancer Neg Hx    Rectal cancer Neg Hx    Stomach cancer Neg Hx  Social History   Socioeconomic History   Marital status: Widowed    Spouse name: Not on file   Number of children: 1   Years of education: 9   Highest education level: 9th grade  Occupational History   Occupation: disability  Tobacco Use   Smoking status: Every Day    Packs/day: 1.00    Years: 35.00    Pack years: 35.00    Types: Cigarettes   Smokeless tobacco: Never  Vaping Use   Vaping Use: Never used  Substance and Sexual Activity   Alcohol use: No   Drug use: No    Sexual activity: Not Currently    Birth control/protection: Surgical  Other Topics Concern   Not on file  Social History Narrative   Not on file   Social Determinants of Health   Financial Resource Strain: Low Risk    Difficulty of Paying Living Expenses: Not hard at all  Food Insecurity: No Food Insecurity   Worried About Charity fundraiser in the Last Year: Never true   Ran Out of Food in the Last Year: Never true  Transportation Needs: No Transportation Needs   Lack of Transportation (Medical): No   Lack of Transportation (Non-Medical): No  Physical Activity: Inactive   Days of Exercise per Week: 0 days   Minutes of Exercise per Session: 0 min  Stress: No Stress Concern Present   Feeling of Stress : Not at all  Social Connections: Moderately Integrated   Frequency of Communication with Friends and Family: More than three times a week   Frequency of Social Gatherings with Friends and Family: More than three times a week   Attends Religious Services: 1 to 4 times per year   Active Member of [employer] or Organizations: Yes   Attends Archivist Meetings: 1 to 4 times per year   Marital Status: Widowed    Tobacco Counseling Ready to quit: Not Answered Counseling given: Not Answered   Clinical Intake:  Pre-visit preparation completed: Yes  Pain : No/denies pain     BMI - recorded: 28.46 Nutritional Status: BMI 25 -29 Overweight Nutritional Risks: None Diabetes: No  How often do you need to have someone help you when you read instructions, pamphlets, or other written materials from your doctor or pharmacy?: 1 - Never  Diabetic? No  Interpreter Needed?: No  Information entered by :: Zhoe Catania, LPN   Activities of Daily Living In your present state of health, do you have any difficulty performing the following activities: 04/16/2021  Hearing? N  Vision? N  Difficulty concentrating or making decisions? N  Walking or climbing stairs? Y  Comment bothers  her joints  Dressing or bathing? N  Doing errands, shopping? N  Preparing Food and eating ? N  Using the Toilet? N  In the past six months, have you accidently leaked urine? Y  Comment constant bladder leakage - wears pads for protection  Do you have problems with loss of bowel control? N  Managing your Medications? N  Managing your Finances? N  Housekeeping or managing your Housekeeping? N  Some recent data might be hidden    Patient Care Team: Chevis Pretty, FNP as PCP - General (Nurse Practitioner)  Indicate any recent Medical Services you may have received from other than [facility] providers in the past year (date may be approximate).     Assessment:   This is a routine wellness examination for Lisa Ball.  Hearing/Vision screen Hearing Screening - Comments:: Denies  hearing difficulties  Vision Screening - Comments:: Denies vision difficulties - up to date with annual eye exams at [facility] in [place] issues and exercise activities discussed: Current Exercise Habits: The patient does not participate in regular exercise at present, Type of exercise: walking, Time (Minutes): 10, Frequency (Times/Week): 7, Weekly Exercise (Minutes/Week): 70, Intensity: Mild, Exercise limited by: orthopedic condition(s)   Goals Addressed             This Visit's Progress    DIET - INCREASE WATER INTAKE   Not on track    Patient Stated   Not on track    04/12/2020 AWV Goal: Exercise for General Health  Patient will verbalize understanding of the benefits of increased physical activity: Exercising regularly is important. It will improve your overall fitness, flexibility, and endurance. Regular exercise also will improve your overall health. It can help you control your weight, reduce stress, and improve your bone density. Over the next year, patient will increase physical activity as tolerated with a goal of at least 150 minutes of moderate physical activity per week.  You can tell that  you are exercising at a moderate intensity if your heart starts beating faster and you start breathing faster but can still hold a conversation. Moderate-intensity exercise ideas include: Walking 1 mile (1.6 km) in about 15 minutes Biking Hiking Golfing Dancing Water aerobics Patient will verbalize understanding of everyday activities that increase physical activity by providing examples like the following: Yard work, such as: Sales promotion account executive Gardening Washing windows or floors Patient will be able to explain general safety guidelines for exercising:  Before you start a new exercise program, talk with your health care provider. Do not exercise so much that you hurt yourself, feel dizzy, or get very short of breath. Wear comfortable clothes and wear shoes with good support. Drink plenty of water while you exercise to prevent dehydration or heat stroke. Work out until your breathing and your heartbeat get faster.        Depression Screen PHQ 2/9 Scores 04/16/2021 03/15/2021 09/14/2020 04/12/2020 10/14/2019 04/13/2019 03/01/2019  PHQ - 2 Score 0 0 0 0 0 0 0  PHQ- 9 Score 0 0 0 - - - -    Fall Risk Fall Risk  04/16/2021 03/15/2021 09/14/2020 04/12/2020 10/14/2019  Falls in the past year? 0 0 0 0 0  Number falls in past yr: 0 - - - -  Injury with Fall? 0 - - - -  Risk for fall due to : Orthopedic patient - - - -  Follow up Falls prevention discussed - - Falls evaluation completed -    FALL RISK PREVENTION PERTAINING TO THE HOME:  Any stairs in or around the home? No  If so, are there any without handrails? No  Home free of loose throw rugs in walkways, pet beds, electrical cords, etc? Yes  Adequate lighting in your home to reduce risk of falls? Yes   ASSISTIVE DEVICES UTILIZED TO PREVENT FALLS:  Life alert? No  Use of a cane, walker or w/c? No  Grab bars in the bathroom? Yes  Shower chair or bench in  shower? No  Elevated toilet seat or a handicapped toilet? No   TIMED UP AND GO:  Was the test performed? No . Telephonic visit  Cognitive Function: Normal cognitive status assessed by direct observation by this Nurse Health Advisor. No abnormalities found.  6CIT Screen 04/12/2020 03/01/2019  What Year? 0 points 0 points  What month? 0 points 0 points  What time? 0 points 0 points  Count back from 20 0 points 0 points  Months in reverse 0 points 0 points  Repeat phrase 0 points 2 points  Total Score 0 2    Immunizations Immunization History  Administered Date(s) Administered   Influenza,inj,Quad PF,6+ Mos 07/24/2011   Influenza-Unspecified 05/19/2014   PPD Test 09/23/2012, 03/30/2014, 03/27/2015, 03/28/2016, 03/25/2017, 10/06/2018   Pneumococcal Polysaccharide-23 10/16/2004   Tdap 01/31/2011, 03/15/2021    TDAP status: Up to date  Flu Vaccine status: Due, Education has been provided regarding the importance of this vaccine. Advised may receive this vaccine at local pharmacy or Health Dept. Aware to provide a copy of the vaccination record if obtained from local pharmacy or Health Dept. Verbalized acceptance and understanding.  Pneumococcal vaccine status: Due, Education has been provided regarding the importance of this vaccine. Advised may receive this vaccine at local pharmacy or Health Dept. Aware to provide a copy of the vaccination record if obtained from local pharmacy or Health Dept. Verbalized acceptance and understanding.  Covid-19 vaccine status: Declined, Education has been provided regarding the importance of this vaccine but patient still declined. Advised may receive this vaccine at local pharmacy or Health Dept.or vaccine clinic. Aware to provide a copy of the vaccination record if obtained from local pharmacy or Health Dept. Verbalized acceptance and understanding.  Qualifies for Shingles Vaccine? Yes   Zostavax completed No   Shingrix Completed?: No.     Education has been provided regarding the importance of this vaccine. Patient has been advised to call insurance company to determine out of pocket expense if they have not yet received this vaccine. Advised may also receive vaccine at local pharmacy or Health Dept. Verbalized acceptance and understanding.  Screening Tests Health Maintenance  Topic Date Due   COVID-19 Vaccine (1) Never done   DEXA SCAN  12/24/2019   MAMMOGRAM  01/16/2020   INFLUENZA VACCINE  04/16/2021   Zoster Vaccines- Shingrix (1 of 2) 06/15/2021 (Originally [birth year]/09/1980)   HIV Screening  09/14/2021 (Originally [birth year]/09/1976)   Pneumococcal Vaccine 60-50 Years old (2 - PCV) 03/15/2022 (Originally 10/16/2005)   COLONOSCOPY (Pts 45-71yr Insurance coverage will need to be confirmed)  02/05/2022   PAP SMEAR-Modifier  03/15/2024   TETANUS/TDAP  03/16/2031   Hepatitis C Screening  Completed   HPV VACCINES  Aged Out    Health Maintenance  Health Maintenance Due  Topic Date Due   COVID-19 Vaccine (1) Never done   DEXA SCAN  12/24/2019   MAMMOGRAM  01/16/2020   INFLUENZA VACCINE  04/16/2021    Colorectal cancer screening: Type of screening: Colonoscopy. Completed 02/06/2012. Repeat every 10 years  Mammogram status: Ordered 04/2021. Pt provided with contact info and advised to call to schedule appt.   Bone Density status: Ordered 04/2021. Pt provided with contact info and advised to call to schedule appt.  Lung Cancer Screening: (Low Dose CT Chest recommended if Age 60-80years, 30 pack-year currently smoking OR have quit w/in 15years.) does qualify.   Lung Cancer Screening Referral: sent today  Additional Screening:  Hepatitis C Screening: does qualify; Completed 06/20/2016  Vision Screening: Recommended annual ophthalmology exams for early detection of glaucoma and other disorders of the eye. Is the patient up to date with their annual eye exam?  Yes  Who is the provider or what is the name of the office in which the  patient attends annual eye exams? [facility] [facility] If pt is not established with a provider, would they like to be referred to a provider to establish care? No .   Dental Screening: Recommended annual dental exams for proper oral hygiene  Community Resource Referral / Chronic Care Management: CRR required this visit?  No   CCM required this visit?  No      Plan:     I have personally reviewed and noted the following in the patient's chart:   Medical and social history Use of alcohol, tobacco or illicit drugs  Current medications and supplements including opioid prescriptions.  Functional ability and status Nutritional status Physical activity Advanced directives List of other physicians Hospitalizations, surgeries, and ER visits in previous 12 months Vitals Screenings to include cognitive, depression, and falls Referrals and appointments  In addition, I have reviewed and discussed with patient certain preventive protocols, quality metrics, and best practice recommendations. A written personalized care plan for preventive services as well as general preventive health recommendations were provided to patient.     Sandrea Hammond, LPN   [id number]   Nurse Notes: None

## 2021-04-16 NOTE — Patient Instructions (Signed)
Lisa Ball , Thank you for taking time to come for your Medicare Wellness Visit. I appreciate your ongoing commitment to your health goals. Please review the following plan we discussed and let me know if I can assist you in the future.   Screening recommendations/referrals: Colonoscopy: Done 02/06/2012 - Repeat in 10 years  Mammogram: Done 01/15/2018 - Recommend Repeat annually - Keep appointment scheduled for 05/16/21 Bone Density: Done 12/23/2017 - Repeat every 2 years Recommended yearly ophthalmology/optometry visit for glaucoma screening and checkup Recommended yearly dental visit for hygiene and checkup  Vaccinations: Influenza vaccine: Declined - Recommend annually  Pneumococcal vaccine: Pneumovax-23 done 10/16/2004; due for Prevnar-13 Tdap vaccine: Done 03/15/2021 - Repeat in 10 years  Shingles vaccine: Due. Shingrix discussed. Please contact your pharmacy for coverage information.     Covid-19: Declined  Advanced directives: Please bring a copy of your health care power of attorney and living will to the office to be added to your chart at your convenience.   Conditions/risks identified: Aim for 30 minutes of exercise or brisk walking each day, drink 6-8 glasses of water and eat lots of fruits and vegetables.   Next appointment: Follow up in one year for your annual wellness visit    Preventive Care 65 Years and Older, Female Preventive care refers to lifestyle choices and visits with your health care provider that can promote health and wellness. What does preventive care include? A yearly physical exam. This is also called an annual well check. Dental exams once or twice a year. Routine eye exams. Ask your health care provider how often you should have your eyes checked. Personal lifestyle choices, including: Daily care of your teeth and gums. Regular physical activity. Eating a healthy diet. Avoiding tobacco and drug use. Limiting alcohol use. Practicing safe sex. Taking  low-dose aspirin every day. Taking vitamin and mineral supplements as recommended by your health care provider. What happens during an annual well check? The services and screenings done by your health care provider during your annual well check will depend on your age, overall health, lifestyle risk factors, and family history of disease. Counseling  Your health care provider may ask you questions about your: Alcohol use. Tobacco use. Drug use. Emotional well-being. Home and relationship well-being. Sexual activity. Eating habits. History of falls. Memory and ability to understand (cognition). Work and work Statistician. Reproductive health. Screening  You may have the following tests or measurements: Height, weight, and BMI. Blood pressure. Lipid and cholesterol levels. These may be checked every 5 years, or more frequently if you are over 50 years old. Skin check. Lung cancer screening. You may have this screening every year starting at age 61 if you have a 30-pack-year history of smoking and currently smoke or have quit within the past 15 years. Fecal occult blood test (FOBT) of the stool. You may have this test every year starting at age 21. Flexible sigmoidoscopy or colonoscopy. You may have a sigmoidoscopy every 5 years or a colonoscopy every 10 years starting at age 76. Hepatitis C blood test. Hepatitis B blood test. Sexually transmitted disease (STD) testing. Diabetes screening. This is done by checking your blood sugar (glucose) after you have not eaten for a while (fasting). You may have this done every 1-3 years. Bone density scan. This is done to screen for osteoporosis. You may have this done starting at age 60. Mammogram. This may be done every 1-2 years. Talk to your health care provider about how often you should have regular  mammograms. Talk with your health care provider about your test results, treatment options, and if necessary, the need for more tests. Vaccines   Your health care provider may recommend certain vaccines, such as: Influenza vaccine. This is recommended every year. Tetanus, diphtheria, and acellular pertussis (Tdap, Td) vaccine. You may need a Td booster every 10 years. Zoster vaccine. You may need this after age 33. Pneumococcal 13-valent conjugate (PCV13) vaccine. One dose is recommended after age 23. Pneumococcal polysaccharide (PPSV23) vaccine. One dose is recommended after age 88. Talk to your health care provider about which screenings and vaccines you need and how often you need them. This information is not intended to replace advice given to you by your health care provider. Make sure you discuss any questions you have with your health care provider. Document Released: 09/29/2015 Document Revised: 05/22/2016 Document Reviewed: 07/04/2015 Elsevier Interactive Patient Education  2017 [employer] Prevention in the Home Falls can cause injuries. They can happen to people of all ages. There are many things you can do to make your home safe and to help prevent falls. What can I do on the outside of my home? Regularly fix the edges of walkways and driveways and fix any cracks. Remove anything that might make you trip as you walk through a door, such as a raised step or threshold. Trim any bushes or trees on the path to your home. Use bright outdoor lighting. Clear any walking paths of anything that might make someone trip, such as rocks or tools. Regularly check to see if handrails are loose or broken. Make sure that both sides of any steps have handrails. Any raised decks and porches should have guardrails on the edges. Have any leaves, snow, or ice cleared regularly. Use sand or salt on walking paths during winter. Clean up any spills in your garage right away. This includes oil or grease spills. What can I do in the bathroom? Use night lights. Install grab bars by the toilet and in the tub and shower. Do not use towel  bars as grab bars. Use non-skid mats or decals in the tub or shower. If you need to sit down in the shower, use a plastic, non-slip stool. Keep the floor dry. Clean up any water that spills on the floor as soon as it happens. Remove soap buildup in the tub or shower regularly. Attach bath mats securely with double-sided non-slip rug tape. Do not have throw rugs and other things on the floor that can make you trip. What can I do in the bedroom? Use night lights. Make sure that you have a light by your bed that is easy to reach. Do not use any sheets or blankets that are too big for your bed. They should not hang down onto the floor. Have a firm chair that has side arms. You can use this for support while you get dressed. Do not have throw rugs and other things on the floor that can make you trip. What can I do in the kitchen? Clean up any spills right away. Avoid walking on wet floors. Keep items that you use a lot in easy-to-reach places. If you need to reach something above you, use a strong step stool that has a grab bar. Keep electrical cords out of the way. Do not use floor polish or wax that makes floors slippery. If you must use wax, use non-skid floor wax. Do not have throw rugs and other things on the floor that can  make you trip. What can I do with my stairs? Do not leave any items on the stairs. Make sure that there are handrails on both sides of the stairs and use them. Fix handrails that are broken or loose. Make sure that handrails are as long as the stairways. Check any carpeting to make sure that it is firmly attached to the stairs. Fix any carpet that is loose or worn. Avoid having throw rugs at the top or bottom of the stairs. If you do have throw rugs, attach them to the floor with carpet tape. Make sure that you have a light switch at the top of the stairs and the bottom of the stairs. If you do not have them, ask someone to add them for you. What else can I do to help  prevent falls? Wear shoes that: Do not have high heels. Have rubber bottoms. Are comfortable and fit you well. Are closed at the toe. Do not wear sandals. If you use a stepladder: Make sure that it is fully opened. Do not climb a closed stepladder. Make sure that both sides of the stepladder are locked into place. Ask someone to hold it for you, if possible. Clearly mark and make sure that you can see: Any grab bars or handrails. First and last steps. Where the edge of each step is. Use tools that help you move around (mobility aids) if they are needed. These include: Canes. Walkers. Scooters. Crutches. Turn on the lights when you go into a dark area. Replace any light bulbs as soon as they burn out. Set up your furniture so you have a clear path. Avoid moving your furniture around. If any of your floors are uneven, fix them. If there are any pets around you, be aware of where they are. Review your medicines with your doctor. Some medicines can make you feel dizzy. This can increase your chance of falling. Ask your doctor what other things that you can do to help prevent falls. This information is not intended to replace advice given to you by your health care provider. Make sure you discuss any questions you have with your health care provider. Document Released: 06/29/2009 Document Revised: 02/08/2016 Document Reviewed: 10/07/2014 Elsevier Interactive Patient Education  2017 [employer].

## 2021-05-16 ENCOUNTER — Other Ambulatory Visit: Payer: Self-pay

## 2021-05-16 ENCOUNTER — Ambulatory Visit: Payer: Medicare Other

## 2021-05-16 ENCOUNTER — Ambulatory Visit
Admission: RE | Admit: 2021-05-16 | Discharge: 2021-05-16 | Disposition: A | Discharge status: Home / Self Care | Payer: Medicare Other | Source: Ambulatory Visit | Attending: Nurse Practitioner | Admitting: Nurse Practitioner

## 2021-05-16 DIAGNOSIS — Z1231 Encounter for screening mammogram for malignant neoplasm of breast: Secondary | ICD-10-CM | POA: Diagnosis not present

## 2021-05-17 ENCOUNTER — Other Ambulatory Visit (HOSPITAL_COMMUNITY): Payer: Self-pay

## 2021-05-17 ENCOUNTER — Encounter (HOSPITAL_COMMUNITY): Payer: Self-pay

## 2021-05-17 DIAGNOSIS — Z122 Encounter for screening for malignant neoplasm of respiratory organs: Secondary | ICD-10-CM

## 2021-05-17 DIAGNOSIS — Z87891 Personal history of nicotine dependence: Secondary | ICD-10-CM

## 2021-05-17 NOTE — Progress Notes (Signed)
Received referral for initial lung cancer screening scan. Contacted patient and obtained smoking history (started age 61 smoking 1PPD, current smoker, states that she smokes at least 1PPD, 40 pack year) as well as answering questions related to the screening process. Patient denies signs/symptoms of lung cancer such as weight loss or hemoptysis. Patient denies comorbidity that would prevent curative treatment if lung cancer were to be found.  Patient is scheduled for shared decision making visit and CT scan on 09/202/2022 at 1500.

## 2021-05-17 NOTE — Progress Notes (Signed)
LDCT order placed.

## 2021-06-04 NOTE — Progress Notes (Signed)
Lisa Ball  Visit Date: 06/05/2021 Visit Type: In-Person at [facility] SHARED DECISION-MAKING VISIT - Age: 60 y.o.  - Pack year smoking history: 40 pack-years (1 PPD x 40 years) - Type of tobacco abuse: Cigarettes - Current smoker or < 15 years of cessation: Current smoker, 1 PPD - No current symptoms of lung cancer:   Patient denies any hemoptysis, unintentional weight loss, and unexplained cough - Risks and benefits of lung cancer screening discussed: Negative: Over-diagnosis, radiation exposure, false positives, and additional testing Positive: Discover early stage lung cancer resulting in higher incidence of cure - Patient educated regarding the importance of adherence to continued lung cancer screening. - Currently, there are no co-morbidities to prevent treatment to therapy for lung cancer and the patient is agreeable to pursue treatment if a malignancy is discovered.  [place] Preventative Services Task Force recommend annual screening for lung cancer with low-dose CT in adults aged 34 - 65 years who have a 20+ pack year smoking history and currently smoke or have quit smoking within the past 15 years.  Screening should be discontinued once a person has not smoked for 15 years or develops a health problem that substantially limits life expectancy or the ability or willingness to have curative lung surgery.  It is a category B recommendation.  Similar stances are provided by CMS, NCCN, and AATS.  Social History   Tobacco Use   Smoking status: Every Day    Packs/day: 1.00    Years: 35.00    Pack years: 35.00    Types: Cigarettes   Smokeless tobacco: Never  Vaping Use   Vaping Use: Never used  Substance Use Topics   Alcohol use: No   Drug use: No     Personal history of tobacco use presenting hazards to health: - This patient meets criteria for low-dose CT lung cancer screening  - The shared decision making visit discussion included risks and  benefits of screening, potential for follow-up, diagnostic testing for abnormal scans, potential for false positive tests, overdiagnosis, discussion about total radiation exposure - Patient stated willingness to undergo diagnostics and treatment as needed - Patient was counseled on smoking cessation to decrease the  risk of lung cancer, pulmonary disease, heart disease, and stroke - Patient has been referred to Lung Cancer Screening Nurse Coordinator for further scheduling of LDCT and for further resources regarding free nicotine replacement therapy and information about smoking cessation classes - Counseling on the importance of adherence to annual lung cancer LDCT screening, impact of co-morbidities, and ability or willingness to undergo diagnosis and treatment is imperative for compliance of the program. - Counseling on the importance of continued smoking cessation for former smokers; the importance of smoking cessation for current smokers, and information about tobacco cessation interventions have been given to patient including [place] and 1-800-quit [place] programs.  Yearly follow up will be coordinated by Adonis Huguenin, RN, MSN [facility] Oncology Nurse Navigator.)  Lisa Rush, PA-C  06/05/21 3:27 PM

## 2021-06-05 ENCOUNTER — Other Ambulatory Visit: Payer: Self-pay

## 2021-06-05 ENCOUNTER — Ambulatory Visit (HOSPITAL_COMMUNITY)
Admission: RE | Admit: 2021-06-05 | Discharge: 2021-06-05 | Disposition: A | Discharge status: Home / Self Care | Payer: Medicare Other | Source: Ambulatory Visit | Attending: Physician Assistant | Admitting: Physician Assistant

## 2021-06-05 ENCOUNTER — Inpatient Hospital Stay (HOSPITAL_COMMUNITY): Payer: Medicare Other | Attending: Physician Assistant | Admitting: Physician Assistant

## 2021-06-05 DIAGNOSIS — Z87891 Personal history of nicotine dependence: Secondary | ICD-10-CM | POA: Diagnosis not present

## 2021-06-05 DIAGNOSIS — F17210 Nicotine dependence, cigarettes, uncomplicated: Secondary | ICD-10-CM

## 2021-06-05 DIAGNOSIS — Z122 Encounter for screening for malignant neoplasm of respiratory organs: Secondary | ICD-10-CM | POA: Insufficient documentation

## 2021-06-05 NOTE — Patient Instructions (Signed)
You were seen today for your Lung Cancer Screening shared decision-making visit and low-dose CT scan. Thank you for your participation in this lifesaving program!

## 2021-06-07 ENCOUNTER — Other Ambulatory Visit: Payer: Self-pay | Admitting: Nurse Practitioner

## 2021-06-07 DIAGNOSIS — E782 Mixed hyperlipidemia: Secondary | ICD-10-CM

## 2021-06-10 ENCOUNTER — Other Ambulatory Visit: Payer: Self-pay | Admitting: Nurse Practitioner

## 2021-06-10 DIAGNOSIS — E782 Mixed hyperlipidemia: Secondary | ICD-10-CM

## 2021-06-11 ENCOUNTER — Telehealth: Payer: Self-pay | Admitting: Nurse Practitioner

## 2021-06-11 NOTE — Telephone Encounter (Signed)
Patient aware of results and verbalizes understanding.  

## 2021-06-26 ENCOUNTER — Encounter (HOSPITAL_COMMUNITY): Payer: Self-pay

## 2021-06-26 ENCOUNTER — Other Ambulatory Visit (HOSPITAL_COMMUNITY): Payer: Self-pay

## 2021-06-26 DIAGNOSIS — Z87891 Personal history of nicotine dependence: Secondary | ICD-10-CM

## 2021-06-26 DIAGNOSIS — Z122 Encounter for screening for malignant neoplasm of respiratory organs: Secondary | ICD-10-CM

## 2021-06-26 NOTE — Progress Notes (Signed)
Patient notified of LDCT Lung Cancer Screening Results via telephone with the recommendation to follow-up in 6 months. Patient's referring provider has been sent a copy of results. Results are as follows:  IMPRESSION: Innumerable irregular pulmonary nodules and cysts, most prominent in the upper lungs superimposed on a background of centrilobular emphysema. Largest nodule measures up to 7 mm. Findings are likely due to smoking-related Langerhans cell histiocytosis. Lung-RADS 3, probably benign findings. Short-term follow-up in 6 months is recommended with repeat low-dose chest CT without contrast (please use the following order, "CT CHEST LCS NODULE FOLLOW-UP W/O CM").   Three-vessel coronary artery calcifications, aortic Atherosclerosis (ICD10-I70.0) and Emphysema (ICD10-J43.9).  Follow-up scheduled for 12/03/21 at 1400.

## 2021-08-29 ENCOUNTER — Other Ambulatory Visit: Payer: Self-pay | Admitting: Nurse Practitioner

## 2021-08-29 DIAGNOSIS — E782 Mixed hyperlipidemia: Secondary | ICD-10-CM

## 2021-09-13 ENCOUNTER — Ambulatory Visit (INDEPENDENT_AMBULATORY_CARE_PROVIDER_SITE_OTHER): Payer: Medicare Other | Admitting: Nurse Practitioner

## 2021-09-13 ENCOUNTER — Encounter: Payer: Self-pay | Admitting: Nurse Practitioner

## 2021-09-13 ENCOUNTER — Ambulatory Visit: Payer: Medicare Other

## 2021-09-13 VITALS — BP 120/77 | HR 70 | Temp 97.7°F | Ht 65.0 in | Wt 174.8 lb

## 2021-09-13 DIAGNOSIS — F3342 Major depressive disorder, recurrent, in full remission: Secondary | ICD-10-CM

## 2021-09-13 DIAGNOSIS — F17200 Nicotine dependence, unspecified, uncomplicated: Secondary | ICD-10-CM

## 2021-09-13 DIAGNOSIS — E782 Mixed hyperlipidemia: Secondary | ICD-10-CM | POA: Diagnosis not present

## 2021-09-13 DIAGNOSIS — I10 Essential (primary) hypertension: Secondary | ICD-10-CM | POA: Diagnosis not present

## 2021-09-13 DIAGNOSIS — R739 Hyperglycemia, unspecified: Secondary | ICD-10-CM

## 2021-09-13 DIAGNOSIS — Z6829 Body mass index (BMI) 29.0-29.9, adult: Secondary | ICD-10-CM

## 2021-09-13 MED ORDER — LISINOPRIL-HYDROCHLOROTHIAZIDE 20-12.5 MG PO TABS: 1.0000 | ORAL_TABLET | Freq: Every day | ORAL | 1 refills | Status: DC

## 2021-09-13 MED ORDER — ESCITALOPRAM OXALATE 20 MG PO TABS: 20.0000 mg | ORAL_TABLET | Freq: Every day | ORAL | 1 refills | Status: DC

## 2021-09-13 MED ORDER — ATORVASTATIN CALCIUM 40 MG PO TABS: 40.0000 mg | ORAL_TABLET | Freq: Every day | ORAL | 1 refills | Status: DC

## 2021-09-13 MED ORDER — FENOFIBRATE 160 MG PO TABS: 160.0000 mg | ORAL_TABLET | Freq: Every day | ORAL | 1 refills | Status: DC

## 2021-09-13 NOTE — Patient Instructions (Signed)
Acute Knee Pain, Adult °Many things can cause knee pain. Sometimes, knee pain is sudden (acute) and may be caused by damage, swelling, or irritation of the muscles and tissues that support your knee. °The pain often goes away on its own with time and rest. If the pain does not go away, tests may be done to find out what is causing the pain. °Follow these instructions at home: °If you have a knee sleeve or brace: ° °Wear the knee sleeve or brace as told by your doctor. Take it off only as told by your doctor. °Loosen it if your toes: °Tingle. °Become numb. °Turn cold and blue. °Keep it clean. °If the knee sleeve or brace is not waterproof: °Do not let it get wet. °Cover it with a watertight covering when you take a bath or shower. °Activity °Rest your knee. °Do not do things that cause pain or make pain worse. °Avoid activities where both feet leave the ground at the same time (high-impact activities). Examples are running, jumping rope, and doing jumping jacks. °Work with a physical therapist to make a safe exercise program, as told by your doctor. °Managing pain, stiffness, and swelling ° °If told, put ice on the knee. To do this: °If you have a removable knee sleeve or brace, take it off as told by your doctor. °Put ice in a plastic bag. °Place a towel between your skin and the bag. °Leave the ice on for 20 minutes, 2-3 times a day. °Take off the ice if your skin turns bright red. This is very important. If you cannot feel pain, heat, or cold, you have a greater risk of damage to the area. °If told, use an elastic bandage to put pressure (compression) on your injured knee. °Raise your knee above the level of your heart while you are sitting or lying down. °Sleep with a pillow under your knee. °General instructions °Take over-the-counter and prescription medicines only as told by your doctor. °Do not smoke or use any products that contain nicotine or tobacco. If you need help quitting, ask your doctor. °If you are  overweight, work with your doctor and a food expert (dietitian) to set goals to lose weight. Being overweight can make your knee hurt more. °Watch for any changes in your symptoms. °Keep all follow-up visits. °Contact a doctor if: °The knee pain does not stop. °The knee pain changes or gets worse. °You have a fever along with knee pain. °Your knee is red or feels warm when you touch it. °Your knee gives out or locks up. °Get help right away if: °Your knee swells, and the swelling gets worse. °You cannot move your knee. °You have very bad knee pain that does not get better with pain medicine. °Summary °Many things can cause knee pain. The pain often goes away on its own with time and rest. °Your doctor may do tests to find out the cause of the pain. °Watch for any changes in your symptoms. Relieve your pain with rest, medicines, light activity, and use of ice. °Get help right away if you cannot move your knee or your knee pain is very bad. °This information is not intended to replace advice given to you by your health care provider. Make sure you discuss any questions you have with your health care provider. °Document Revised: 02/16/2020 Document Reviewed: 02/16/2020 °Elsevier Patient Education © 2022 Elsevier Inc. ° °

## 2021-09-13 NOTE — Progress Notes (Signed)
Subjective:    Patient ID: Nonah Mattes, female    DOB: 02-Dec-1960, 60 y.o.   MRN: [id number]  Chief Complaint: medical management of chronic issues     HPI:  DRU LAUREL is a 60 y.o. who identifies as a female who was assigned female at birth.   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 Primary hypertension No c/o chest pain,sob or headache. Does not check blood pressure at home. BP Readings from Last 3 Encounters:  03/15/21 113/77  09/14/20 139/87  04/13/19 108/70     2. Mixed hyperlipidemia Does not really watch diet and does no dedicated exercise. Lab Results  Component Value Date   CHOL 144 03/15/2021   HDL 27 (L) 03/15/2021   LDLCALC 74 03/15/2021   TRIG 263 (H) 03/15/2021   CHOLHDL 5.3 (H) 03/15/2021   The 10-year ASCVD risk score (Arnett DK, et al., 2019) is: 10.1%   3. Hyperglycemia Has had elevated blood sugars in the past. She does not check blood sugars at home.  She has not ever had HGBA1c and we may do one today.   4. Recurrent major depressive disorder, in full remission ([place]) Is currently on lexapro daily and is ding well Depression screen [facility] 2/9 09/13/2021 04/16/2021 03/15/2021  Decreased Interest 0 0 0  Down, Depressed, Hopeless 0 0 0  PHQ - 2 Score 0 0 0  Altered sleeping 0 0 0  Tired, decreased energy 0 0 0  Change in appetite 0 0 0  Feeling bad or failure about yourself  0 0 0  Trouble concentrating 0 0 0  Moving slowly or fidgety/restless 0 0 0  Suicidal thoughts 0 0 0  PHQ-9 Score 0 0 0  Difficult doing work/chores Not difficult at all Not difficult at all Not difficult at all  Some recent data might be hidden     5. Smoker Smokes around a pack a day. Has chest CT scheduled for 12/03/21  6. BMI 29.0-29.9,adult No recent weight changes Wt Readings from Last 3 Encounters:  09/13/21 174 lb 12.8 oz (79.3 kg)   04/16/21 171 lb (77.6 kg)  03/15/21 171 lb (77.6 kg)   BMI Readings from Last 3 Encounters:  09/13/21 29.09 kg/m  04/16/21 28.46 kg/m  03/15/21 28.46 kg/m     New complaints: Bil knee pain. Started about 1 week ago. Hurts to sand and walk. She is on meds for arthritis, but is not helping.  Allergies  Allergen Reactions   Methotrexate Derivatives Shortness Of Breath   Penicillins Other (See Comments)    Couldn't  move   Outpatient Encounter Medications as of 09/13/2021  Medication Sig   albuterol (PROVENTIL HFA;VENTOLIN HFA) 108 (90 Base) MCG/ACT inhaler Inhale 2 puffs into the lungs every 6 (six) hours as needed for wheezing or shortness of breath.   atorvastatin (LIPITOR) 40 MG tablet TAKE 1 TABLET BY MOUTH EVERY DAY   clobetasol cream (TEMOVATE) 2.59 % Apply 1 application topically 2 (two) times daily.   dextromethorphan-guaiFENesin (MUCINEX DM) 30-600 MG 12hr tablet Take 1 tablet by mouth 2 (two) times daily.   escitalopram (LEXAPRO) 20 MG tablet Take 1 tablet (20 mg total) by mouth daily.   fenofibrate 160 MG tablet TAKE 1 TABLET BY MOUTH EVERY DAY   lisinopril-hydrochlorothiazide (ZESTORETIC) 20-12.5 MG tablet Take 1 tablet by mouth daily.   meloxicam (MOBIC)  15 MG tablet Take 15 mg by mouth daily.   Tofacitinib Citrate 5 MG TABS Take by mouth.   No facility-administered encounter medications on file as of 09/13/2021.    Past Surgical History:  Procedure Laterality Date   BREAST CYST EXCISION Left    many years ago   CHOLECYSTECTOMY     TUBAL LIGATION      Family History  Problem Relation Age of Onset   Heart disease Mother    Cancer Father 69       lung cancer-died at age 35   Diabetes Sister    Seizures Maternal Grandmother    Heart disease Maternal Grandfather    Seizures Paternal Grandmother    Heart disease Paternal Grandfather    Breast cancer Paternal Aunt    Colon cancer Neg Hx    Esophageal cancer Neg Hx    Rectal cancer Neg Hx    Stomach  cancer Neg Hx       Controlled substance contract: n/a     Review of Systems  Constitutional:  Negative for diaphoresis.  Eyes:  Negative for pain.  Respiratory:  Negative for shortness of breath.   Cardiovascular:  Negative for chest pain, palpitations and leg swelling.  Gastrointestinal:  Negative for abdominal pain.  Endocrine: Negative for polydipsia.  Skin:  Negative for rash.  Neurological:  Negative for dizziness, weakness and headaches.  Hematological:  Does not bruise/bleed easily.  All other systems reviewed and are negative.     Objective:   Physical Exam Vitals and nursing note reviewed.  Constitutional:      General: She is not in acute distress.    Appearance: Normal appearance. She is well-developed.  HENT:     Head: Normocephalic.     Right Ear: Tympanic membrane normal.     Left Ear: Tympanic membrane normal.     Nose: Nose normal.     Mouth/Throat:     Mouth: Mucous membranes are moist.  Eyes:     Pupils: Pupils are equal, round, and reactive to light.  Neck:     Vascular: No carotid bruit or JVD.  Cardiovascular:     Rate and Rhythm: Normal rate and regular rhythm.     Heart sounds: Normal heart sounds.  Pulmonary:     Effort: Pulmonary effort is normal. No respiratory distress.     Breath sounds: Normal breath sounds. No wheezing or rales.  Chest:     Chest wall: No tenderness.  Abdominal:     General: Bowel sounds are normal. There is no distension or abdominal bruit.     Palpations: Abdomen is soft. There is no hepatomegaly, splenomegaly, mass or pulsatile mass.     Tenderness: There is no abdominal tenderness.  Musculoskeletal:        General: Normal range of motion.     Cervical back: Normal range of motion and neck supple.     Comments: FROM of bil knees wit crepitus on flexion and extension  Lymphadenopathy:     Cervical: No cervical adenopathy.  Skin:    General: Skin is warm and dry.  Neurological:     Mental Status: She is  alert and oriented to person, place, and time.     Deep Tendon Reflexes: Reflexes are normal and symmetric.  Psychiatric:        Behavior: Behavior normal.        Thought Content: Thought content normal.        Judgment: Judgment normal.   BP  120/77    Pulse 70    Temp 97.7 F (36.5 C) (Temporal)    Ht _0  (1.651 m)    Wt 174 lb 12.8 oz (79.3 kg)    SpO2 93%    BMI 29.09 kg/m         Assessment & Plan:   WYONIA FONTANELLA comes in today with chief complaint of Medical Management of Chronic Issues and Knee Pain (Bilateral x 1 week )   Diagnosis and orders addressed:  1. Primary hypertension Low sodium diet - CBC with Differential/Platelet - CMP14+EGFR - lisinopril-hydrochlorothiazide (ZESTORETIC) 20-12.5 MG tablet; Take 1 tablet by mouth daily.  Dispense: 90 tablet; Refill: 1  2. Mixed hyperlipidemia Low fat diet - fenofibrate 160 MG tablet; Take 1 tablet (160 mg total) by mouth daily.  Dispense: 90 tablet; Refill: 1 - atorvastatin (LIPITOR) 40 MG tablet; Take 1 tablet (40 mg total) by mouth daily.  Dispense: 90 tablet; Refill: 1 - Lipid panel  3. Hyperglycemia Watch carbs on diet  4. Recurrent major depressive disorder, in full remission ([place]) Ress management - escitalopram (LEXAPRO) 20 MG tablet; Take 1 tablet (20 mg total) by mouth daily.  Dispense: 90 tablet; Refill: 1  5. Smoker Smoking cessation encouaged  6. BMI 29.0-29.9,adult Discussed diet and exercise for person with BMI >25 Will recheck weight in 3-6 months      Labs pending Health Maintenance reviewed Diet and exercise encouraged  Follow up plan: 6 months   Mary-Margaret Hassell Done, FNP

## 2021-09-14 LAB — CBC WITH DIFFERENTIAL/PLATELET
Basophils Absolute: 0 10*3/uL (ref 0.0–0.2)
Basos: 1 %
EOS (ABSOLUTE): 0.1 10*3/uL (ref 0.0–0.4)
Eos: 2 %
Hematocrit: 42.9 % (ref 34.0–46.6)
Hemoglobin: 14.6 g/dL (ref 11.1–15.9)
Immature Grans (Abs): 0 10*3/uL (ref 0.0–0.1)
Immature Granulocytes: 0 %
Lymphocytes Absolute: 1.9 10*3/uL (ref 0.7–3.1)
Lymphs: 29 %
MCH: 32 pg (ref 26.6–33.0)
MCHC: 34 g/dL (ref 31.5–35.7)
MCV: 94 fL (ref 79–97)
Monocytes Absolute: 0.6 10*3/uL (ref 0.1–0.9)
Monocytes: 10 %
Neutrophils Absolute: 3.8 10*3/uL (ref 1.4–7.0)
Neutrophils: 58 %
Platelets: 342 10*3/uL (ref 150–450)
RBC: 4.56 x10E6/uL (ref 3.77–5.28)
RDW: 12.1 % (ref 11.7–15.4)
WBC: 6.4 10*3/uL (ref 3.4–10.8)

## 2021-09-14 LAB — LIPID PANEL
Chol/HDL Ratio: 5.3 ratio — ABNORMAL HIGH (ref 0.0–4.4)
Cholesterol, Total: 121 mg/dL (ref 100–199)
HDL: 23 mg/dL — ABNORMAL LOW (ref >39)
LDL Chol Calc (NIH): 63 mg/dL (ref 0–99)
Triglycerides: 209 mg/dL — ABNORMAL HIGH (ref 0–149)
VLDL Cholesterol Cal: 35 mg/dL (ref 5–40)

## 2021-09-14 LAB — CMP14+EGFR
ALT: 25 IU/L (ref 0–32)
AST: 28 IU/L (ref 0–40)
Albumin/Globulin Ratio: 1.8 (ref 1.2–2.2)
Albumin: 4.5 g/dL (ref 3.8–4.9)
Alkaline Phosphatase: 47 IU/L (ref 44–121)
BUN/Creatinine Ratio: 11 — ABNORMAL LOW (ref 12–28)
BUN: 10 mg/dL (ref 8–27)
Bilirubin Total: 0.4 mg/dL (ref 0.0–1.2)
CO2: 28 mmol/L (ref 20–29)
Calcium: 10.2 mg/dL (ref 8.7–10.3)
Chloride: 98 mmol/L (ref 96–106)
Creatinine, Ser: 0.88 mg/dL (ref 0.57–1.00)
Globulin, Total: 2.5 g/dL (ref 1.5–4.5)
Glucose: 87 mg/dL (ref 70–99)
Potassium: 3.4 mmol/L — ABNORMAL LOW (ref 3.5–5.2)
Sodium: 139 mmol/L (ref 134–144)
Total Protein: 7 g/dL (ref 6.0–8.5)
eGFR: 75 mL/min/{1.73_m2} (ref >59)

## 2021-09-19 DIAGNOSIS — L4050 Arthropathic psoriasis, unspecified: Secondary | ICD-10-CM | POA: Diagnosis not present

## 2021-09-19 DIAGNOSIS — Z8673 Personal history of transient ischemic attack (TIA), and cerebral infarction without residual deficits: Secondary | ICD-10-CM | POA: Diagnosis not present

## 2021-09-19 DIAGNOSIS — F32A Depression, unspecified: Secondary | ICD-10-CM | POA: Diagnosis not present

## 2021-09-19 DIAGNOSIS — Z888 Allergy status to other drugs, medicaments and biological substances status: Secondary | ICD-10-CM | POA: Diagnosis not present

## 2021-09-19 DIAGNOSIS — Z8249 Family history of ischemic heart disease and other diseases of the circulatory system: Secondary | ICD-10-CM | POA: Diagnosis not present

## 2021-09-19 DIAGNOSIS — R202 Paresthesia of skin: Secondary | ICD-10-CM | POA: Diagnosis not present

## 2021-09-19 DIAGNOSIS — G459 Transient cerebral ischemic attack, unspecified: Secondary | ICD-10-CM | POA: Diagnosis not present

## 2021-09-19 DIAGNOSIS — R29818 Other symptoms and signs involving the nervous system: Secondary | ICD-10-CM | POA: Diagnosis not present

## 2021-09-19 DIAGNOSIS — E119 Type 2 diabetes mellitus without complications: Secondary | ICD-10-CM | POA: Diagnosis not present

## 2021-09-19 DIAGNOSIS — Z9181 History of falling: Secondary | ICD-10-CM | POA: Diagnosis not present

## 2021-09-19 DIAGNOSIS — G9349 Other encephalopathy: Secondary | ICD-10-CM | POA: Diagnosis not present

## 2021-09-19 DIAGNOSIS — F17210 Nicotine dependence, cigarettes, uncomplicated: Secondary | ICD-10-CM | POA: Diagnosis not present

## 2021-09-19 DIAGNOSIS — R918 Other nonspecific abnormal finding of lung field: Secondary | ICD-10-CM | POA: Diagnosis not present

## 2021-09-19 DIAGNOSIS — Z880 Allergy status to penicillin: Secondary | ICD-10-CM | POA: Diagnosis not present

## 2021-09-19 DIAGNOSIS — E785 Hyperlipidemia, unspecified: Secondary | ICD-10-CM | POA: Diagnosis not present

## 2021-09-19 DIAGNOSIS — R9082 White matter disease, unspecified: Secondary | ICD-10-CM | POA: Diagnosis not present

## 2021-09-19 DIAGNOSIS — Z20822 Contact with and (suspected) exposure to covid-19: Secondary | ICD-10-CM | POA: Diagnosis not present

## 2021-09-19 DIAGNOSIS — I10 Essential (primary) hypertension: Secondary | ICD-10-CM | POA: Diagnosis not present

## 2021-09-19 DIAGNOSIS — R531 Weakness: Secondary | ICD-10-CM | POA: Diagnosis not present

## 2021-09-19 DIAGNOSIS — E876 Hypokalemia: Secondary | ICD-10-CM | POA: Diagnosis not present

## 2021-09-19 DIAGNOSIS — I639 Cerebral infarction, unspecified: Secondary | ICD-10-CM | POA: Diagnosis not present

## 2021-09-19 DIAGNOSIS — R29898 Other symptoms and signs involving the musculoskeletal system: Secondary | ICD-10-CM | POA: Diagnosis not present

## 2021-09-20 DIAGNOSIS — L4050 Arthropathic psoriasis, unspecified: Secondary | ICD-10-CM | POA: Diagnosis not present

## 2021-09-20 DIAGNOSIS — R531 Weakness: Secondary | ICD-10-CM | POA: Insufficient documentation

## 2021-09-20 DIAGNOSIS — I359 Nonrheumatic aortic valve disorder, unspecified: Secondary | ICD-10-CM | POA: Diagnosis not present

## 2021-09-20 DIAGNOSIS — I10 Essential (primary) hypertension: Secondary | ICD-10-CM | POA: Diagnosis not present

## 2021-09-20 DIAGNOSIS — R29898 Other symptoms and signs involving the musculoskeletal system: Secondary | ICD-10-CM | POA: Insufficient documentation

## 2021-09-20 DIAGNOSIS — E782 Mixed hyperlipidemia: Secondary | ICD-10-CM | POA: Diagnosis not present

## 2021-09-20 DIAGNOSIS — R9082 White matter disease, unspecified: Secondary | ICD-10-CM | POA: Diagnosis not present

## 2021-09-20 DIAGNOSIS — I639 Cerebral infarction, unspecified: Secondary | ICD-10-CM | POA: Diagnosis not present

## 2021-09-21 ENCOUNTER — Telehealth: Payer: Self-pay | Admitting: Nurse Practitioner

## 2021-09-21 ENCOUNTER — Telehealth: Payer: Self-pay

## 2021-09-21 NOTE — Telephone Encounter (Signed)
appt made, pt aware

## 2021-09-21 NOTE — Telephone Encounter (Signed)
Transition Care Management Follow-up Telephone Call Date of discharge and from where: 09/20/21 - [facility] - acute left frontal stroke, right sided weakness How have you been since you were released from the hospital? Seems to be slowly getting better, weakness on right side improving Any questions or concerns? No  Items Reviewed: Did the pt receive and understand the discharge instructions provided? Yes  Medications obtained and verified? Yes  Other? No  Any new allergies since your discharge? No  Dietary orders reviewed? Yes Do you have support at home? Yes   Home Care and Equipment/Supplies: Were home health services ordered? No - outpatient PT was ordered, but she declined - doesn't feel it necessary If so, what is the name of the agency? N/a  Has the agency set up a time to come to the patient's home? not applicable Were any new equipment or medical supplies ordered?  No What is the name of the medical supply agency? N/a Were you able to get the supplies/equipment? not applicable Do you have any questions related to the use of the equipment or supplies? No  Functional Questionnaire: (I = Independent and D = Dependent) ADLs: I  Bathing/Dressing- I  Meal Prep- I  Eating- I  Maintaining continence- I  Transferring/Ambulation- I  Managing Meds- I  Follow up appointments reviewed:  PCP Hospital f/u appt confirmed? Yes  Scheduled to see Chevis Pretty on 10/01/21 @ 10:15. [facility] f/u appt confirmed? Yes  Scheduled to see cardiology for placement of event monitor on 09/24/21 @ 8:30. Are transportation arrangements needed? No  If their condition worsens, is the pt aware to call PCP or go to the Emergency Dept.? Yes Was the patient provided with contact information for the PCP's office or ED? Yes Was to pt encouraged to call back with questions or concerns? Yes

## 2021-09-24 DIAGNOSIS — G459 Transient cerebral ischemic attack, unspecified: Secondary | ICD-10-CM | POA: Diagnosis not present

## 2021-09-24 DIAGNOSIS — R002 Palpitations: Secondary | ICD-10-CM | POA: Diagnosis not present

## 2021-09-26 DIAGNOSIS — E782 Mixed hyperlipidemia: Secondary | ICD-10-CM | POA: Diagnosis not present

## 2021-09-26 DIAGNOSIS — F17200 Nicotine dependence, unspecified, uncomplicated: Secondary | ICD-10-CM | POA: Diagnosis not present

## 2021-09-26 DIAGNOSIS — Z8673 Personal history of transient ischemic attack (TIA), and cerebral infarction without residual deficits: Secondary | ICD-10-CM | POA: Diagnosis not present

## 2021-09-26 DIAGNOSIS — I10 Essential (primary) hypertension: Secondary | ICD-10-CM | POA: Diagnosis not present

## 2021-10-01 ENCOUNTER — Encounter: Payer: Self-pay | Admitting: Nurse Practitioner

## 2021-10-01 ENCOUNTER — Ambulatory Visit (INDEPENDENT_AMBULATORY_CARE_PROVIDER_SITE_OTHER): Payer: Medicare Other | Admitting: Nurse Practitioner

## 2021-10-01 VITALS — BP 96/62 | HR 65 | Temp 97.8°F | Resp 20 | Ht 65.0 in | Wt 176.0 lb

## 2021-10-01 DIAGNOSIS — I6930 Unspecified sequelae of cerebral infarction: Secondary | ICD-10-CM

## 2021-10-01 DIAGNOSIS — Z8673 Personal history of transient ischemic attack (TIA), and cerebral infarction without residual deficits: Secondary | ICD-10-CM | POA: Insufficient documentation

## 2021-10-01 DIAGNOSIS — Z7689 Persons encountering health services in other specified circumstances: Secondary | ICD-10-CM

## 2021-10-01 NOTE — Patient Instructions (Signed)
Hospital Discharge After a Stroke  Being discharged from the hospital after a stroke can feel overwhelming. Many things may be different. It is normal to feel scared or anxious. Some stroke survivors may be able to return to their homes. Others may need morespecialized care on a temporary or permanent basis. Your stroke care team will work with you to develop a discharge plan that is best for you. Ask questions if you do not understand something. Invite a friend or family member to participate in discharge planning. It is important to understand and follow your discharge plan to help prevent another stroke orother problems. General recommendations  Ask a friend or family member to get needed things in place before you go home if possible. A therapist can come to your home to make recommendations for safety equipment. Ask your health care provider if you would benefit from this service or from home care. Take steps to prevent falls, such as: Installing grab bars in the shower or using a shower chair. Install grab bars by the toilet. Removing tripping hazards, such as area rugs or cords. Supplies needed Ask your health care provider for a list of medical equipment and supplies you will need at home. These may include: Walkers. Canes. Wheelchairs. Hand-strengthening devices. Special eating utensils. Medical equipment can be rented or purchased, depending on your insurancecoverage. Check with your insurance company about what is covered. Follow these instructions at home: Medicines Take all medicines exactly as told by your health care provider to prevent serious harm, such as another stroke. Make sure you understand: What medicine to take. Why you are taking the medicine. How and when to take it. If it can be taken with your other medicines and herbal supplements. Possible side effects, and when to call your health care provider if you have side effects. How you will get and pay for your  medicines. Medical assistance programs may be able to help you pay for prescription medicines if you cannot afford them. If you are taking an anticoagulant: Be sure to take it exactly as told by your health care provider. This medicine can increase the risk of bleeding because it works to prevent blood clots. You may need to take certain precautions to prevent bleeding. Do not take medicines that contain aspirin or NSAIDs, such as ibuprofen, unless your health care provider approves. These medicines increase your risk for dangerous bleeding. Preventing another stroke Having a stroke puts you at risk for another stroke in the future. Ask your health care provider what actions you can take to lower the risk. These may include: Increasing how much you exercise. Making a healthy eating plan. Quitting smoking. Managing other health conditions, such as high blood pressure, high cholesterol, or diabetes. Limiting alcohol use. General instructions Before you leave the hospital, you will be given information about stroke warning signs. Share these with your friends and family members. You will need to follow up regularly with a health care provider. You may need rehabilitation, such as physical therapy, occupational therapy, and speech-language therapy. Keeping these appointments is very important to your recovery. Be sure to bring your medicine list and discharge papers to your appointments. Use a calendar or appointment reminder if you need help to keep track of your schedule. Contact a health care provider if: You are taking an anticoagulant, and you have one or more of these problems: Bleeding or bruising. A fall or other injury to your head. Blood in your urine or stool (feces). Get help right   away if you:  Have any symptoms of a stroke. "BE FAST" is an easy way to remember the main warning signs of a stroke: B - Balance. Signs are dizziness, sudden trouble walking, or loss of balance. E - Eyes.  Signs are trouble seeing or a sudden change in vision. F - Face. Signs are sudden weakness or numbness of the face, or the face or eyelid drooping on one side. A - Arms. Signs are weakness or numbness in an arm. This happens suddenly and usually on one side of the body. S - Speech. Signs are sudden trouble speaking, slurred speech, or trouble understanding what people say. T - Time. Time to call emergency services. Write down what time symptoms started. Your emergency responders will need to know this information. Have other signs of a stroke, such as: A sudden, severe headache with no known cause. Nausea or vomiting. Seizure. These symptoms may represent a serious problem that is an emergency. Do not wait to see if the symptoms will go away. Get medical help right away. Call your local emergency services (911 in the [place] Do not drive yourself to the hospital. Summary Being discharged from the hospital after a stroke can feel overwhelming. It is normal to feel scared or anxious. Make sure you take medicines exactly as told by your health care provider. Know the warning signs of a stroke. Before you leave the hospital, you will be given information about stroke warning signs. Share these with your friends and family members. Get help right way if you have any symptoms of a stroke. "BE FAST" is an easy way to remember the main warning signs of a stroke. This information is not intended to replace advice given to you by your health care provider. Make sure you discuss any questions you have with your healthcare provider. Document Revised: 04/19/2020 Document Reviewed: 04/19/2020 Elsevier Patient Education  2022 Elsevier Inc.  

## 2021-10-01 NOTE — Progress Notes (Signed)
Subjective:    Patient ID: Lisa Ball, female    DOB: 22-Nov-1960, 61 y.o.   MRN: [id number]  Today's visit was for Transitional Care Management.  The patient was discharged from [facility] on 09/20/21 with a primary diagnosis of CVA.   Contact with the patient and/or caregiver, by a clinical staff member, was made on 09/21/21 and was documented as a telephone encounter within the EMR.  Through chart review and discussion with the patient I have determined that management of their condition is of moderate complexity.   Patient went to the ED at [facility] on 09/19/21 with right sided weakness. It was determined that she had light CVA. She was started on baby aspirin daily. Added metformin because her hgba1c was 6.7%. they also increased her liptor to 40mg , which patient says she was already taking. Since being home , she has been fine. She has no residual effects. No weakness or fatigue. She was sent home on heart monitor to wear for a month.      Review of Systems  Constitutional:  Negative for diaphoresis.  Eyes:  Negative for pain.  Respiratory:  Negative for shortness of breath.   Cardiovascular:  Negative for chest pain, palpitations and leg swelling.  Gastrointestinal:  Negative for abdominal pain.  Endocrine: Negative for polydipsia.  Skin:  Negative for rash.  Neurological:  Negative for dizziness, weakness and headaches.  Hematological:  Does not bruise/bleed easily.  All other systems reviewed and are negative.     Objective:   Physical Exam Vitals and nursing note reviewed.  Constitutional:      General: She is not in acute distress.    Appearance: Normal appearance. She is well-developed.  Neck:     Vascular: No carotid bruit or JVD.  Cardiovascular:     Rate and Rhythm: Normal rate and regular rhythm.     Heart sounds: Normal heart sounds.  Pulmonary:     Effort: Pulmonary effort is normal. No respiratory distress.     Breath sounds: Normal breath sounds. No wheezing  or rales.  Chest:     Chest wall: No tenderness.  Abdominal:     General: Bowel sounds are normal. There is no distension or abdominal bruit.     Palpations: Abdomen is soft. There is no hepatomegaly, splenomegaly, mass or pulsatile mass.     Tenderness: There is no abdominal tenderness.  Musculoskeletal:        General: Normal range of motion.     Cervical back: Normal range of motion and neck supple.  Lymphadenopathy:     Cervical: No cervical adenopathy.  Skin:    General: Skin is warm and dry.  Neurological:     General: No focal deficit present.     Mental Status: She is alert and oriented to person, place, and time.     Cranial Nerves: No cranial nerve deficit.     Sensory: No sensory deficit.     Deep Tendon Reflexes: Reflexes are normal and symmetric.  Psychiatric:        Behavior: Behavior normal.        Thought Content: Thought content normal.        Judgment: Judgment normal.    BP 96/62    Pulse 65    Temp 97.8 F (36.6 C) (Temporal)    Resp 20    Ht 5\' 5"  (1.651 m)    Wt 176 lb (79.8 kg)    SpO2 94%    BMI 29.29 kg/m  Assessment & Plan:   REBEL LAUGHRIDGE in today with chief complaint of Transitions Of Care   1. Late effect of cerebrovascular accident (CVA)  2. Encounter for support and coordination of transition of care Hospital records reviewed Continue all meds as prescribed Follow up in 3 months    The above assessment and management plan was discussed with the patient. The patient verbalized understanding of and has agreed to the management plan. Patient is aware to call the clinic if symptoms persist or worsen. Patient is aware when to return to the clinic for a follow-up visit. Patient educated on when it is appropriate to go to the emergency department.   Mary-Margaret Hassell Done, FNP

## 2021-11-01 DIAGNOSIS — R002 Palpitations: Secondary | ICD-10-CM | POA: Diagnosis not present

## 2021-11-01 DIAGNOSIS — Z8673 Personal history of transient ischemic attack (TIA), and cerebral infarction without residual deficits: Secondary | ICD-10-CM | POA: Diagnosis not present

## 2021-11-21 DIAGNOSIS — Z8673 Personal history of transient ischemic attack (TIA), and cerebral infarction without residual deficits: Secondary | ICD-10-CM | POA: Diagnosis not present

## 2021-11-21 DIAGNOSIS — M545 Low back pain, unspecified: Secondary | ICD-10-CM | POA: Diagnosis not present

## 2021-11-21 DIAGNOSIS — M549 Dorsalgia, unspecified: Secondary | ICD-10-CM | POA: Diagnosis not present

## 2021-11-21 DIAGNOSIS — M47816 Spondylosis without myelopathy or radiculopathy, lumbar region: Secondary | ICD-10-CM | POA: Diagnosis not present

## 2021-11-21 DIAGNOSIS — M160 Bilateral primary osteoarthritis of hip: Secondary | ICD-10-CM | POA: Diagnosis not present

## 2021-11-21 DIAGNOSIS — Z7982 Long term (current) use of aspirin: Secondary | ICD-10-CM | POA: Diagnosis not present

## 2021-11-21 DIAGNOSIS — M25569 Pain in unspecified knee: Secondary | ICD-10-CM | POA: Diagnosis not present

## 2021-11-21 DIAGNOSIS — L409 Psoriasis, unspecified: Secondary | ICD-10-CM | POA: Diagnosis not present

## 2021-11-21 DIAGNOSIS — M25552 Pain in left hip: Secondary | ICD-10-CM | POA: Diagnosis not present

## 2021-11-21 DIAGNOSIS — M1990 Unspecified osteoarthritis, unspecified site: Secondary | ICD-10-CM | POA: Diagnosis not present

## 2021-11-21 DIAGNOSIS — Z79899 Other long term (current) drug therapy: Secondary | ICD-10-CM | POA: Diagnosis not present

## 2021-11-21 DIAGNOSIS — Z1159 Encounter for screening for other viral diseases: Secondary | ICD-10-CM | POA: Diagnosis not present

## 2021-11-21 DIAGNOSIS — L4050 Arthropathic psoriasis, unspecified: Secondary | ICD-10-CM | POA: Diagnosis not present

## 2021-12-03 ENCOUNTER — Ambulatory Visit (HOSPITAL_COMMUNITY): Payer: Medicare Other

## 2022-01-01 ENCOUNTER — Ambulatory Visit (INDEPENDENT_AMBULATORY_CARE_PROVIDER_SITE_OTHER): Payer: Medicare Other | Admitting: Nurse Practitioner

## 2022-01-01 ENCOUNTER — Other Ambulatory Visit: Payer: Self-pay

## 2022-01-01 ENCOUNTER — Encounter: Payer: Self-pay | Admitting: Nurse Practitioner

## 2022-01-01 ENCOUNTER — Ambulatory Visit (INDEPENDENT_AMBULATORY_CARE_PROVIDER_SITE_OTHER): Payer: Medicare Other

## 2022-01-01 VITALS — BP 107/66 | HR 81 | Temp 97.8°F | Resp 20 | Ht 65.0 in | Wt 173.0 lb

## 2022-01-01 DIAGNOSIS — J0100 Acute maxillary sinusitis, unspecified: Secondary | ICD-10-CM | POA: Diagnosis not present

## 2022-01-01 DIAGNOSIS — I6930 Unspecified sequelae of cerebral infarction: Secondary | ICD-10-CM

## 2022-01-01 DIAGNOSIS — Z6829 Body mass index (BMI) 29.0-29.9, adult: Secondary | ICD-10-CM

## 2022-01-01 DIAGNOSIS — F17200 Nicotine dependence, unspecified, uncomplicated: Secondary | ICD-10-CM

## 2022-01-01 DIAGNOSIS — R739 Hyperglycemia, unspecified: Secondary | ICD-10-CM | POA: Diagnosis not present

## 2022-01-01 DIAGNOSIS — I10 Essential (primary) hypertension: Secondary | ICD-10-CM | POA: Diagnosis not present

## 2022-01-01 DIAGNOSIS — M8588 Other specified disorders of bone density and structure, other site: Secondary | ICD-10-CM | POA: Diagnosis not present

## 2022-01-01 DIAGNOSIS — L4050 Arthropathic psoriasis, unspecified: Secondary | ICD-10-CM

## 2022-01-01 DIAGNOSIS — Z780 Asymptomatic menopausal state: Secondary | ICD-10-CM

## 2022-01-01 DIAGNOSIS — E782 Mixed hyperlipidemia: Secondary | ICD-10-CM | POA: Diagnosis not present

## 2022-01-01 DIAGNOSIS — F3342 Major depressive disorder, recurrent, in full remission: Secondary | ICD-10-CM

## 2022-01-01 LAB — CBC WITH DIFFERENTIAL/PLATELET
Basophils Absolute: 0.1 10*3/uL (ref 0.0–0.2)
Basos: 1 %
EOS (ABSOLUTE): 0.3 10*3/uL (ref 0.0–0.4)
Eos: 5 %
Hematocrit: 43.9 % (ref 34.0–46.6)
Hemoglobin: 15 g/dL (ref 11.1–15.9)
Immature Grans (Abs): 0 10*3/uL (ref 0.0–0.1)
Immature Granulocytes: 1 %
Lymphocytes Absolute: 1.2 10*3/uL (ref 0.7–3.1)
Lymphs: 18 %
MCH: 31.5 pg (ref 26.6–33.0)
MCHC: 34.2 g/dL (ref 31.5–35.7)
MCV: 92 fL (ref 79–97)
Monocytes Absolute: 0.8 10*3/uL (ref 0.1–0.9)
Monocytes: 13 %
Neutrophils Absolute: 4.2 10*3/uL (ref 1.4–7.0)
Neutrophils: 62 %
Platelets: 341 10*3/uL (ref 150–450)
RBC: 4.76 x10E6/uL (ref 3.77–5.28)
RDW: 12.5 % (ref 11.7–15.4)
WBC: 6.5 10*3/uL (ref 3.4–10.8)

## 2022-01-01 LAB — CMP14+EGFR
ALT: 18 IU/L (ref 0–32)
AST: 20 IU/L (ref 0–40)
Albumin/Globulin Ratio: 1.5 (ref 1.2–2.2)
Albumin: 4.1 g/dL (ref 3.8–4.9)
Alkaline Phosphatase: 51 IU/L (ref 44–121)
BUN/Creatinine Ratio: 6 — ABNORMAL LOW (ref 12–28)
BUN: 5 mg/dL — ABNORMAL LOW (ref 8–27)
Bilirubin Total: 0.5 mg/dL (ref 0.0–1.2)
CO2: 28 mmol/L (ref 20–29)
Calcium: 9.9 mg/dL (ref 8.7–10.3)
Chloride: 95 mmol/L — ABNORMAL LOW (ref 96–106)
Creatinine, Ser: 0.81 mg/dL (ref 0.57–1.00)
Globulin, Total: 2.7 g/dL (ref 1.5–4.5)
Glucose: 177 mg/dL — ABNORMAL HIGH (ref 70–99)
Potassium: 3.3 mmol/L — ABNORMAL LOW (ref 3.5–5.2)
Sodium: 136 mmol/L (ref 134–144)
Total Protein: 6.8 g/dL (ref 6.0–8.5)
eGFR: 83 mL/min/{1.73_m2} (ref >59)

## 2022-01-01 LAB — LIPID PANEL
Chol/HDL Ratio: 5.1 ratio — ABNORMAL HIGH (ref 0.0–4.4)
Cholesterol, Total: 107 mg/dL (ref 100–199)
HDL: 21 mg/dL — ABNORMAL LOW (ref >39)
LDL Chol Calc (NIH): 53 mg/dL (ref 0–99)
Triglycerides: 198 mg/dL — ABNORMAL HIGH (ref 0–149)
VLDL Cholesterol Cal: 33 mg/dL (ref 5–40)

## 2022-01-01 LAB — BAYER DCA HB A1C WAIVED: HB A1C (BAYER DCA - WAIVED): 6.2 % — ABNORMAL HIGH (ref 4.8–5.6)

## 2022-01-01 MED ORDER — ESCITALOPRAM OXALATE 20 MG PO TABS: 20.0000 mg | ORAL_TABLET | Freq: Every day | ORAL | 1 refills | Status: DC

## 2022-01-01 MED ORDER — METFORMIN HCL 500 MG PO TABS: 500.0000 mg | ORAL_TABLET | Freq: Every day | ORAL | 1 refills | Status: DC

## 2022-01-01 MED ORDER — FENOFIBRATE 160 MG PO TABS: 160.0000 mg | ORAL_TABLET | Freq: Every day | ORAL | 1 refills | Status: DC

## 2022-01-01 MED ORDER — DOXYCYCLINE HYCLATE 100 MG PO TABS: 100.0000 mg | ORAL_TABLET | Freq: Two times a day (BID) | ORAL | 0 refills | Status: DC

## 2022-01-01 MED ORDER — ATORVASTATIN CALCIUM 40 MG PO TABS: 40.0000 mg | ORAL_TABLET | Freq: Every day | ORAL | 1 refills | Status: DC

## 2022-01-01 MED ORDER — LISINOPRIL-HYDROCHLOROTHIAZIDE 20-12.5 MG PO TABS: 1.0000 | ORAL_TABLET | Freq: Every day | ORAL | 1 refills | Status: DC

## 2022-01-01 NOTE — Addendum Note (Signed)
Addended by: Chevis Pretty on: 01/01/2022 11:11 AM ? ? Modules accepted: Orders ? ?

## 2022-01-01 NOTE — Progress Notes (Addendum)
Subjective:    Patient ID: Lisa Ball, female    DOB: Oct 11, 1960, 61 y.o.   MRN: [id number]   Chief Complaint: No chief complaint on file.    HPI:  Lisa Ball is a 61 y.o. who identifies as a female who was assigned female at birth.   Social history: Lives with: her cousin who is handicap lives with her Work history: caregiver   Comes in today for follow up of the following chronic medical issues:  1. Primary hypertension No c/o chest pain, sob or headache. Does not check blood pressure at home BP Readings from Last 3 Encounters:  01/01/22 107/66  10/01/21 96/62  09/13/21 120/77     2. Mixed hyperlipidemia Does not watch diet and little to no dedicated exercise Lab Results  Component Value Date   CHOL 121 09/13/2021   HDL 23 (L) 09/13/2021   LDLCALC 63 09/13/2021   TRIG 209 (H) 09/13/2021   CHOLHDL 5.3 (H) 09/13/2021     3. Psoriatic arthritis [facility])  See rheumatology every 3 months. She is currently on tofacitinib with no side effects.  4. Osteopenia of lumbar spine Last dexascan was done on 12/23/17. Her t score was -0.1. she does no weight bearing exercise.  5. Hyperglycemia She does not check her blood sugars at home. Her last fasting blood sugar was 85  6. Late effect of cerebrovascular accident (CVA) No permanent effects   7. Recurrent major depressive disorder, in full remission ([name]) Is on lexapro daily and is doing well.    01/01/2022   10:41 AM 10/01/2021   10:21 AM 09/13/2021    2:58 PM  Depression screen PHQ 2/9  Decreased Interest 0 0 0  Down, Depressed, Hopeless 0 0 0  PHQ - 2 Score 0 0 0  Altered sleeping 0 0 0  Tired, decreased energy 0 0 0  Change in appetite 0 0 0  Feeling bad or failure about yourself  0 0 0  Trouble concentrating 0 0 0  Moving slowly or fidgety/restless 0 0 0  Suicidal thoughts 0 0 0  PHQ-9 Score 0 0 0  Difficult doing work/chores Not difficult at all Not difficult at all Not difficult at all     8.  Smoker Smokes over a pack of cigarettes a day. She has a low dose CT scan scheduled for 01/10/22.  9. BMI 29.0-29.9,adult Weight is down 3lbs Wt Readings from Last 3 Encounters:  01/01/22 173 lb (78.5 kg)  10/01/21 176 lb (79.8 kg)  09/13/21 174 lb 12.8 oz (79.3 kg)   BMI Readings from Last 3 Encounters:  01/01/22 28.79 kg/m  10/01/21 29.29 kg/m  09/13/21 29.09 kg/m     New complaints: Has sinus drainage and cough for over a week. Lots of facial pressure and teeth hurt a little when she chews. No fever  Allergies  Allergen Reactions   Methotrexate Derivatives Shortness Of Breath   Penicillins Other (See Comments)    Couldn't  move   Outpatient Encounter Medications as of 01/01/2022  Medication Sig   albuterol (PROVENTIL HFA;VENTOLIN HFA) 108 (90 Base) MCG/ACT inhaler Inhale 2 puffs into the lungs every 6 (six) hours as needed for wheezing or shortness of breath.   atorvastatin (LIPITOR) 40 MG tablet Take 1 tablet (40 mg total) by mouth daily.   escitalopram (LEXAPRO) 20 MG tablet Take 1 tablet (20 mg total) by mouth daily.   fenofibrate 160 MG tablet Take 1 tablet (160 mg total) by mouth  daily.   lisinopril-hydrochlorothiazide (ZESTORETIC) 20-12.5 MG tablet Take 1 tablet by mouth daily.   meloxicam (MOBIC) 15 MG tablet Take 15 mg by mouth daily.   metFORMIN (GLUCOPHAGE) 500 MG tablet Take 500 mg by mouth daily.   Tofacitinib Citrate 5 MG TABS Take by mouth.   No facility-administered encounter medications on file as of 01/01/2022.    Past Surgical History:  Procedure Laterality Date   BREAST CYST EXCISION Left    many years ago   CHOLECYSTECTOMY     TUBAL LIGATION      Family History  Problem Relation Age of Onset   Heart disease Mother    Cancer Father 58       lung cancer-died at age 30   Diabetes Sister    Seizures Maternal Grandmother    Heart disease Maternal Grandfather    Seizures Paternal Grandmother    Heart disease Paternal Grandfather    Breast  cancer Paternal Aunt    Colon cancer Neg Hx    Esophageal cancer Neg Hx    Rectal cancer Neg Hx    Stomach cancer Neg Hx       Controlled substance contract: n/a     Review of Systems  Constitutional:  Negative for diaphoresis.  HENT:  Positive for congestion, postnasal drip, sinus pressure and sinus pain. Negative for nosebleeds and sore throat.   Eyes:  Negative for pain.  Respiratory:  Positive for cough (slight). Negative for shortness of breath.   Cardiovascular:  Negative for chest pain, palpitations and leg swelling.  Gastrointestinal:  Negative for abdominal pain.  Endocrine: Negative for polydipsia.  Skin:  Negative for rash.  Neurological:  Negative for dizziness, weakness and headaches.  Hematological:  Does not bruise/bleed easily.  All other systems reviewed and are negative.     Objective:   Physical Exam Vitals and nursing note reviewed.  Constitutional:      General: She is not in acute distress.    Appearance: Normal appearance. She is well-developed.  HENT:     Head: Normocephalic.     Right Ear: Tympanic membrane normal.     Left Ear: Tympanic membrane normal.     Nose: Congestion and rhinorrhea present.     Right Sinus: Maxillary sinus tenderness present.     Left Sinus: Maxillary sinus tenderness present.     Mouth/Throat:     Mouth: Mucous membranes are moist.  Eyes:     Pupils: Pupils are equal, round, and reactive to light.  Neck:     Vascular: No carotid bruit or JVD.  Cardiovascular:     Rate and Rhythm: Normal rate and regular rhythm.     Heart sounds: Normal heart sounds.  Pulmonary:     Effort: Pulmonary effort is normal. No respiratory distress.     Breath sounds: Wheezing (exp wheezes throughout) present. No rales.  Chest:     Chest wall: No tenderness.  Abdominal:     General: Bowel sounds are normal. There is no distension or abdominal bruit.     Palpations: Abdomen is soft. There is no hepatomegaly, splenomegaly, mass or  pulsatile mass.     Tenderness: There is no abdominal tenderness.  Musculoskeletal:        General: Normal range of motion.     Cervical back: Normal range of motion and neck supple.  Lymphadenopathy:     Cervical: No cervical adenopathy.  Skin:    General: Skin is warm and dry.  Neurological:  Mental Status: She is alert and oriented to person, place, and time.     Deep Tendon Reflexes: Reflexes are normal and symmetric.  Psychiatric:        Behavior: Behavior normal.        Thought Content: Thought content normal.        Judgment: Judgment normal.   BP 107/66   Pulse 81   Temp 97.8 F (36.6 C) (Temporal)   Resp 20   Ht 5\' 5"  (1.651 m)   Wt 173 lb (78.5 kg)   SpO2 90%   BMI 28.79 kg/m         Assessment & Plan:  CHINELO HEMRIC comes in today with chief complaint of Medical Management of Chronic Issues   Diagnosis and orders addressed:  1. Primary hypertension Low sodium diet - CBC with Differential/Platelet - CMP14+EGFR  lisinopril-hydrochlorothiazide (ZESTORETIC) 20-12.5 MG tablet; Take 1 tablet by mouth daily.  Dispense: 90 tablet; Refill: 1  2. Mixed hyperlipidemia Low fat diet - Lipid panel - atorvastatin (LIPITOR) 40 MG tablet; Take 1 tablet (40 mg total) by mouth daily.  Dispense: 90 tablet; Refill: 1 - fenofibrate 160 MG tablet; Take 1 tablet (160 mg total) by mouth daily.  Dispense: 90 tablet; Refill: 1  3. Psoriatic arthritis ([name]) Keep follow up with rheumatology  4. Osteopenia of lumbar spine Will schedule bone density test  5. Hyperglycemia Watch carbs in diet Stay on metformin - Bayer DCA Hb A1c Waived  6. Late effect of cerebrovascular accident (CVA) Report any changes  7. Recurrent major depressive disorder, in full remission ([name]) Stress management - escitalopram (LEXAPRO) 20 MG tablet; Take 1 tablet (20 mg total) by mouth daily.  Dispense: 90 tablet; Refill: 1  8. Smoker Smoking cessation encouraged  9. BMI  29.0-29.9,adult Discussed diet and exercise for person with BMI >25 Will recheck weight in 3-6 months  10. Sinusitits 1. Take meds as prescribed 2. Use a cool mist humidifier especially during the winter months and when heat has been humid. 3. Use saline nose sprays frequently 4. Saline irrigations of the nose can be very helpful if done frequently.  * 4X daily for 1 week*  * Use of a nettie pot can be helpful with this. Follow directions with this* 5. Drink plenty of fluids 6. Keep thermostat turn down low 7.For any cough or congestion- delsym 8. For fever or aces or pains- take tylenol or ibuprofen appropriate for age and weight.  * for fevers greater than 101 orally you may alternate ibuprofen and tylenol every  3 hours.   Doxycycline 100mg  1 po BID #20 0 refills  Labs pending Health Maintenance reviewed Diet and exercise encouraged  Follow up plan: 6 months   [name], FNP

## 2022-01-01 NOTE — Progress Notes (Signed)
Dg wrf ?

## 2022-01-03 DIAGNOSIS — Z780 Asymptomatic menopausal state: Secondary | ICD-10-CM | POA: Diagnosis not present

## 2022-01-10 ENCOUNTER — Ambulatory Visit (HOSPITAL_COMMUNITY)
Admission: RE | Admit: 2022-01-10 | Discharge: 2022-01-10 | Disposition: A | Discharge status: Home / Self Care | Payer: Medicare Other | Source: Ambulatory Visit | Attending: Physician Assistant | Admitting: Physician Assistant

## 2022-01-10 DIAGNOSIS — Z87891 Personal history of nicotine dependence: Secondary | ICD-10-CM | POA: Insufficient documentation

## 2022-01-10 DIAGNOSIS — Z122 Encounter for screening for malignant neoplasm of respiratory organs: Secondary | ICD-10-CM | POA: Diagnosis not present

## 2022-01-10 DIAGNOSIS — J439 Emphysema, unspecified: Secondary | ICD-10-CM | POA: Diagnosis not present

## 2022-02-22 DIAGNOSIS — L4050 Arthropathic psoriasis, unspecified: Secondary | ICD-10-CM | POA: Diagnosis not present

## 2022-02-22 DIAGNOSIS — Z79899 Other long term (current) drug therapy: Secondary | ICD-10-CM | POA: Diagnosis not present

## 2022-02-22 DIAGNOSIS — L409 Psoriasis, unspecified: Secondary | ICD-10-CM | POA: Diagnosis not present

## 2022-03-15 ENCOUNTER — Other Ambulatory Visit: Payer: Self-pay

## 2022-03-15 ENCOUNTER — Ambulatory Visit: Payer: Medicare Other | Admitting: Nurse Practitioner

## 2022-03-15 DIAGNOSIS — F3342 Major depressive disorder, recurrent, in full remission: Secondary | ICD-10-CM

## 2022-03-15 DIAGNOSIS — E782 Mixed hyperlipidemia: Secondary | ICD-10-CM

## 2022-03-15 DIAGNOSIS — R059 Cough, unspecified: Secondary | ICD-10-CM

## 2022-03-15 DIAGNOSIS — I10 Essential (primary) hypertension: Secondary | ICD-10-CM

## 2022-03-15 MED ORDER — METFORMIN HCL 500 MG PO TABS: 500.0000 mg | ORAL_TABLET | Freq: Every day | ORAL | 1 refills | Status: DC

## 2022-03-15 MED ORDER — ALBUTEROL SULFATE HFA 108 (90 BASE) MCG/ACT IN AERS: 2.0000 | INHALATION_SPRAY | Freq: Four times a day (QID) | RESPIRATORY_TRACT | 2 refills | Status: AC | PRN

## 2022-03-15 MED ORDER — ESCITALOPRAM OXALATE 20 MG PO TABS: 20.0000 mg | ORAL_TABLET | Freq: Every day | ORAL | 1 refills | Status: DC

## 2022-03-15 MED ORDER — ATORVASTATIN CALCIUM 40 MG PO TABS: 40.0000 mg | ORAL_TABLET | Freq: Every day | ORAL | 1 refills | Status: DC

## 2022-03-15 MED ORDER — LISINOPRIL-HYDROCHLOROTHIAZIDE 20-12.5 MG PO TABS: 1.0000 | ORAL_TABLET | Freq: Every day | ORAL | 1 refills | Status: DC

## 2022-03-15 MED ORDER — FENOFIBRATE 160 MG PO TABS: 160.0000 mg | ORAL_TABLET | Freq: Every day | ORAL | 1 refills | Status: DC

## 2022-04-15 ENCOUNTER — Encounter: Payer: Self-pay | Admitting: Nurse Practitioner

## 2022-04-15 ENCOUNTER — Ambulatory Visit (INDEPENDENT_AMBULATORY_CARE_PROVIDER_SITE_OTHER): Payer: Medicare Other | Admitting: Nurse Practitioner

## 2022-04-15 DIAGNOSIS — L4050 Arthropathic psoriasis, unspecified: Secondary | ICD-10-CM | POA: Diagnosis not present

## 2022-04-15 MED ORDER — PREDNISONE 20 MG PO TABS: 40.0000 mg | ORAL_TABLET | Freq: Every day | ORAL | 0 refills | Status: AC

## 2022-04-15 NOTE — Progress Notes (Signed)
Virtual Visit  Note Due to COVID-19 pandemic this visit was conducted virtually. This visit type was conducted due to national recommendations for restrictions regarding the COVID-19 Pandemic (e.g. social distancing, sheltering in place) in an effort to limit this patient's exposure and mitigate transmission in our community. All issues noted in this document were discussed and addressed.  A physical exam was not performed with this format.  I connected with Lisa Ball on 04/15/22 at 2:29 by telephone and verified that I am speaking with the correct person using two identifiers. Lisa Ball is currently located at home and her nephew  is currently with her during visit. The provider, Mary-Margaret Hassell Done, FNP is located in their office at time of visit.  I discussed the limitations, risks, security and privacy concerns of performing an evaluation and management service by telephone and the availability of in person appointments. I also discussed with the patient that there may be a patient responsible charge related to this service. The patient expressed understanding and agreed to proceed.   History and Present Illness:  Patient makes appointment for arthritis flare  up. She has a history of psoriatic arthritis. She sees rheumatology every 6 months and is currently on cocentyx monthly. She says her knees ankles and toes are hurting. She has been taking tylenol and pain cream which has not helped. Steroid pack usually helps. Her last flare up was in march.      Review of Systems  Constitutional:  Negative for diaphoresis and weight loss.  Eyes:  Negative for blurred vision, double vision and pain.  Respiratory:  Negative for shortness of breath.   Cardiovascular:  Negative for chest pain, palpitations, orthopnea and leg swelling.  Gastrointestinal:  Negative for abdominal pain.  Skin:  Negative for rash.  Neurological:  Negative for dizziness, sensory change, loss of consciousness,  weakness and headaches.  Endo/Heme/Allergies:  Negative for polydipsia. Does not bruise/bleed easily.  Psychiatric/Behavioral:  Negative for memory loss. The patient does not have insomnia.   All other systems reviewed and are negative.    Observations/Objective: Alert and oriented- answers all questions appropriately No distress   Assessment and Plan: Lisa Ball in today with chief complaint of Arthritis   1. Psoriatic arthritis ([place]) Warm showers Stretches Continue cocentyx as prescribed Meds ordered this encounter  Medications  . predniSONE (DELTASONE) 20 MG tablet    Sig: Take 2 tablets (40 mg total) by mouth daily with breakfast for 5 days. 2 po daily for 5 days    Dispense:  10 tablet    Refill:  0    Order Specific Question:   Supervising Provider    Answer:   Caryl Pina A [[id number]]       Follow Up Instructions: prn    I discussed the assessment and treatment plan with the patient. The patient was provided an opportunity to ask questions and all were answered. The patient agreed with the plan and demonstrated an understanding of the instructions.   The patient was advised to call back or seek an in-person evaluation if the symptoms worsen or if the condition fails to improve as anticipated.  The above assessment and management plan was discussed with the patient. The patient verbalized understanding of and has agreed to the management plan. Patient is aware to call the clinic if symptoms persist or worsen. Patient is aware when to return to the clinic for a follow-up visit. Patient educated on when it is appropriate to go to  the emergency department.   Time call ended:  2:41  I provided 12 minutes of  non face-to-face time during this encounter.    Mary-Margaret Hassell Done, FNP

## 2022-04-17 ENCOUNTER — Ambulatory Visit (INDEPENDENT_AMBULATORY_CARE_PROVIDER_SITE_OTHER): Payer: Medicare Other

## 2022-04-17 VITALS — Wt 173.0 lb

## 2022-04-17 DIAGNOSIS — Z0000 Encounter for general adult medical examination without abnormal findings: Secondary | ICD-10-CM

## 2022-04-17 NOTE — Patient Instructions (Signed)
Lisa Ball , Thank you for taking time to come for your Medicare Wellness Visit. I appreciate your ongoing commitment to your health goals. Please review the following plan we discussed and let me know if I can assist you in the future.   Screening recommendations/referrals: Colonoscopy: 02/06/12, declined referral Mammogram: 05/16/21, declined referral Bone Density: 01/03/22 Recommended yearly ophthalmology/optometry visit for glaucoma screening and checkup Recommended yearly dental visit for hygiene and checkup  Vaccinations: Influenza vaccine: n/d Pneumococcal vaccine: 10/16/04 Tdap vaccine: 03/15/21 Shingles vaccine: n/d  Covid-19: n/d  Advanced directives: no  Conditions/risks identified: none  Next appointment: Follow up in one year for your annual wellness visit. 04/21/23 @ 11:15 am by phone  Preventive Care 40-64 Years, Female Preventive care refers to lifestyle choices and visits with your health care provider that can promote health and wellness. What does preventive care include? A yearly physical exam. This is also called an annual well check. Dental exams once or twice a year. Routine eye exams. Ask your health care provider how often you should have your eyes checked. Personal lifestyle choices, including: Daily care of your teeth and gums. Regular physical activity. Eating a healthy diet. Avoiding tobacco and drug use. Limiting alcohol use. Practicing safe sex. Taking low-dose aspirin daily starting at age 47. Taking vitamin and mineral supplements as recommended by your health care provider. What happens during an annual well check? The services and screenings done by your health care provider during your annual well check will depend on your age, overall health, lifestyle risk factors, and family history of disease. Counseling  Your health care provider may ask you questions about your: Alcohol use. Tobacco use. Drug use. Emotional well-being. Home and  relationship well-being. Sexual activity. Eating habits. Work and work Statistician. Method of birth control. Menstrual cycle. Pregnancy history. Screening  You may have the following tests or measurements: Height, weight, and BMI. Blood pressure. Lipid and cholesterol levels. These may be checked every 5 years, or more frequently if you are over 44 years old. Skin check. Lung cancer screening. You may have this screening every year starting at age 15 if you have a 30-pack-year history of smoking and currently smoke or have quit within the past 15 years. Fecal occult blood test (FOBT) of the stool. You may have this test every year starting at age 11. Flexible sigmoidoscopy or colonoscopy. You may have a sigmoidoscopy every 5 years or a colonoscopy every 10 years starting at age 66. Hepatitis C blood test. Hepatitis B blood test. Sexually transmitted disease (STD) testing. Diabetes screening. This is done by checking your blood sugar (glucose) after you have not eaten for a while (fasting). You may have this done every 1-3 years. Mammogram. This may be done every 1-2 years. Talk to your health care provider about when you should start having regular mammograms. This may depend on whether you have a family history of breast cancer. BRCA-related cancer screening. This may be done if you have a family history of breast, ovarian, tubal, or peritoneal cancers. Pelvic exam and Pap test. This may be done every 3 years starting at age 23. Starting at age 38, this may be done every 5 years if you have a Pap test in combination with an HPV test. Bone density scan. This is done to screen for osteoporosis. You may have this scan if you are at high risk for osteoporosis. Discuss your test results, treatment options, and if necessary, the need for more tests with your health care  provider. Vaccines  Your health care provider may recommend certain vaccines, such as: Influenza vaccine. This is recommended  every year. Tetanus, diphtheria, and acellular pertussis (Tdap, Td) vaccine. You may need a Td booster every 10 years. Zoster vaccine. You may need this after age 54. Pneumococcal 13-valent conjugate (PCV13) vaccine. You may need this if you have certain conditions and were not previously vaccinated. Pneumococcal polysaccharide (PPSV23) vaccine. You may need one or two doses if you smoke cigarettes or if you have certain conditions. Talk to your health care provider about which screenings and vaccines you need and how often you need them. This information is not intended to replace advice given to you by your health care provider. Make sure you discuss any questions you have with your health care provider. Document Released: 09/29/2015 Document Revised: 05/22/2016 Document Reviewed: 07/04/2015 Elsevier Interactive Patient Education  2017 [place] Prevention in the Home Falls can cause injuries. They can happen to people of all ages. There are many things you can do to make your home safe and to help prevent falls. What can I do on the outside of my home? Regularly fix the edges of walkways and driveways and fix any cracks. Remove anything that might make you trip as you walk through a door, such as a raised step or threshold. Trim any bushes or trees on the path to your home. Use bright outdoor lighting. Clear any walking paths of anything that might make someone trip, such as rocks or tools. Regularly check to see if handrails are loose or broken. Make sure that both sides of any steps have handrails. Any raised decks and porches should have guardrails on the edges. Have any leaves, snow, or ice cleared regularly. Use sand or salt on walking paths during winter. Clean up any spills in your garage right away. This includes oil or grease spills. What can I do in the bathroom? Use night lights. Install grab bars by the toilet and in the tub and shower. Do not use towel bars as  grab bars. Use non-skid mats or decals in the tub or shower. If you need to sit down in the shower, use a plastic, non-slip stool. Keep the floor dry. Clean up any water that spills on the floor as soon as it happens. Remove soap buildup in the tub or shower regularly. Attach bath mats securely with double-sided non-slip rug tape. Do not have throw rugs and other things on the floor that can make you trip. What can I do in the bedroom? Use night lights. Make sure that you have a light by your bed that is easy to reach. Do not use any sheets or blankets that are too big for your bed. They should not hang down onto the floor. Have a firm chair that has side arms. You can use this for support while you get dressed. Do not have throw rugs and other things on the floor that can make you trip. What can I do in the kitchen? Clean up any spills right away. Avoid walking on wet floors. Keep items that you use a lot in easy-to-reach places. If you need to reach something above you, use a strong step stool that has a grab bar. Keep electrical cords out of the way. Do not use floor polish or wax that makes floors slippery. If you must use wax, use non-skid floor wax. Do not have throw rugs and other things on the floor that can make  you trip. What can I do with my stairs? Do not leave any items on the stairs. Make sure that there are handrails on both sides of the stairs and use them. Fix handrails that are broken or loose. Make sure that handrails are as long as the stairways. Check any carpeting to make sure that it is firmly attached to the stairs. Fix any carpet that is loose or worn. Avoid having throw rugs at the top or bottom of the stairs. If you do have throw rugs, attach them to the floor with carpet tape. Make sure that you have a light switch at the top of the stairs and the bottom of the stairs. If you do not have them, ask someone to add them for you. What else can I do to help prevent  falls? Wear shoes that: Do not have high heels. Have rubber bottoms. Are comfortable and fit you well. Are closed at the toe. Do not wear sandals. If you use a stepladder: Make sure that it is fully opened. Do not climb a closed stepladder. Make sure that both sides of the stepladder are locked into place. Ask someone to hold it for you, if possible. Clearly mark and make sure that you can see: Any grab bars or handrails. First and last steps. Where the edge of each step is. Use tools that help you move around (mobility aids) if they are needed. These include: Canes. Walkers. Scooters. Crutches. Turn on the lights when you go into a dark area. Replace any light bulbs as soon as they burn out. Set up your furniture so you have a clear path. Avoid moving your furniture around. If any of your floors are uneven, fix them. If there are any pets around you, be aware of where they are. Review your medicines with your doctor. Some medicines can make you feel dizzy. This can increase your chance of falling. Ask your doctor what other things that you can do to help prevent falls. This information is not intended to replace advice given to you by your health care provider. Make sure you discuss any questions you have with your health care provider. Document Released: 06/29/2009 Document Revised: 02/08/2016 Document Reviewed: 10/07/2014 Elsevier Interactive Patient Education  2017 [employer].

## 2022-04-17 NOTE — Progress Notes (Signed)
Virtual Visit via Telephone Note  I connected with  Lisa Ball on 04/17/22 at  1:00 PM EDT by telephone and verified that I am speaking with the correct person using two identifiers.  Location: Patient: home Provider: [facility] Persons participating in the virtual visit: patient/Nurse Health Advisor   I discussed the limitations, risks, security and privacy concerns of performing an evaluation and management service by telephone and the availability of in person appointments. The patient expressed understanding and agreed to proceed.  Interactive audio and video telecommunications were attempted between this nurse and patient, however failed, due to patient having technical difficulties OR patient did not have access to video capability.  We continued and completed visit with audio only.  Some vital signs may be absent or patient reported.   Lisa David, LPN  Subjective:   ALUEL Ball is Lisa 61 y.o. female who presents for Medicare Annual (Subsequent) preventive examination.  Review of Systems     Cardiac Risk Factors include: diabetes mellitus;dyslipidemia     Objective:    There were no vitals filed for this visit. There is no height or weight on file to calculate BMI.     04/17/2022    1:08 PM 04/16/2021    4:11 PM 04/12/2020    9:32 AM 03/01/2019    9:49 AM  Advanced Directives  Does Patient Have Lisa Medical Advance Directive? No No No No  Would patient like information on creating Lisa medical advance directive? No - Patient declined Yes (MAU/Ambulatory/Procedural Areas - Information given) No - Patient declined No - Patient declined    Current Medications (verified) Outpatient Encounter Medications as of 04/17/2022  Medication Sig   atorvastatin (LIPITOR) 40 MG tablet Take 1 tablet (40 mg total) by mouth daily.   escitalopram (LEXAPRO) 20 MG tablet Take 1 tablet (20 mg total) by mouth daily.   fenofibrate 160 MG tablet Take 1 tablet (160 mg total) by mouth daily.    lisinopril-hydrochlorothiazide (ZESTORETIC) 20-12.5 MG tablet Take 1 tablet by mouth daily.   metFORMIN (GLUCOPHAGE) 500 MG tablet Take 1 tablet (500 mg total) by mouth daily.   predniSONE (DELTASONE) 20 MG tablet Take 2 tablets (40 mg total) by mouth daily with breakfast for 5 days. 2 po daily for 5 days   Secukinumab (COSENTYX SENSOREADY PEN) 150 MG/ML SOAJ Inject the contents of 1 pen into the skin every 4 weeks   albuterol (VENTOLIN HFA) 108 (90 Base) MCG/ACT inhaler Inhale 2 puffs into the lungs every 6 (six) hours as needed for wheezing or shortness of breath. (Patient not taking: Reported on 04/17/2022)   doxycycline (VIBRA-TABS) 100 MG tablet Take 1 tablet (100 mg total) by mouth 2 (two) times daily. 1 po bid (Patient not taking: Reported on 04/17/2022)   No facility-administered encounter medications on file as of 04/17/2022.    Allergies (verified) Methotrexate derivatives and Penicillins   History: Past Medical History:  Diagnosis Date   Anxiety    Colon polyps    Depression    Hyperlipidemia    Hypertension    Psoriatic arthritis ([name])    Pulmonary fibrosis ([name])    RA (rheumatoid arthritis) ([place])    Past Surgical History:  Procedure Laterality Date   BREAST CYST EXCISION Left    many years ago   CHOLECYSTECTOMY     TUBAL LIGATION     Family History  Problem Relation Age of Onset   Heart disease Mother    Cancer Father 70  lung cancer-died at age 40   Diabetes Sister    Seizures Maternal Grandmother    Heart disease Maternal Grandfather    Seizures Paternal Grandmother    Heart disease Paternal Grandfather    Breast cancer Paternal Aunt    Colon cancer Neg Hx    Esophageal cancer Neg Hx    Rectal cancer Neg Hx    Stomach cancer Neg Hx    Social History   Socioeconomic History   Marital status: Widowed    Spouse name: Not on file   Number of children: 1   Years of education: 9   Highest education level: 9th grade  Occupational History   Occupation:  disability  Tobacco Use   Smoking status: Every Day    Packs/day: 1.00    Years: 35.00    Total pack years: 35.00    Types: Cigarettes   Smokeless tobacco: Never  Vaping Use   Vaping Use: Never used  Substance and Sexual Activity   Alcohol use: No   Drug use: No   Sexual activity: Not Currently    Birth control/protection: Surgical  Other Topics Concern   Not on file  Social History Narrative   Not on file   Social Determinants of Health   Financial Resource Strain: Low Risk  (04/17/2022)   Overall Financial Resource Strain (CARDIA)    Difficulty of Paying Living Expenses: Not hard at all  Food Insecurity: No Food Insecurity (04/17/2022)   Hunger Vital Sign    Worried About Running Out of Food in the Last Year: Never true    Ran Out of Food in the Last Year: Never true  Transportation Needs: No Transportation Needs (04/17/2022)   PRAPARE - Hydrologist (Medical): No    Lack of Transportation (Non-Medical): No  Physical Activity: Insufficiently Active (04/17/2022)   Exercise Vital Sign    Days of Exercise per Week: 2 days    Minutes of Exercise per Session: 20 min  Stress: No Stress Concern Present (04/17/2022)   [place]    Feeling of Stress : Not at all  Social Connections: Socially Isolated (04/17/2022)   Social Connection and Isolation Panel [NHANES]    Frequency of Communication with Friends and Family: More than three times Lisa week    Frequency of Social Gatherings with Friends and Family: More than three times Lisa week    Attends Religious Services: Never    Marine scientist or Organizations: No    Attends Archivist Meetings: Never    Marital Status: Widowed    Tobacco Counseling Ready to quit: Not Answered Counseling given: Not Answered   Clinical Intake:  Pre-visit preparation completed: Yes  Pain : No/denies pain     Nutritional Risks:  None Diabetes: Yes CBG done?: No Did pt. bring in CBG monitor from home?: No  How often do you need to have someone help you when you read instructions, pamphlets, or other written materials from your doctor or pharmacy?: 1 - Never  Diabetic?yes Nutrition Risk Assessment:  Has the patient had any N/V/D within the last 2 months?  No  Does the patient have any non-healing wounds?  No  Has the patient had any unintentional weight loss or weight gain?  No   Diabetes:  Is the patient diabetic?  Yes  If diabetic, was Lisa CBG obtained today?  No  Did the patient bring in their glucometer from home?  No  How often do you monitor your CBG's? occasionally.   Financial Strains and Diabetes Management:  Are you having any financial strains with the device, your supplies or your medication? No .  Does the patient want to be seen by Chronic Care Management for management of their diabetes?  No  Would the patient like to be referred to Lisa Nutritionist or for Diabetic Management?  No   Diabetic Exams:  Diabetic Eye Exam: Completed no. Overdue for diabetic eye exam. Pt has been advised about the importance in completing this exam.   Diabetic Foot Exam: Completed no. Pt has been advised about the importance in completing this exam.   Interpreter Needed?: No  Information entered by :: Kirke Shaggy, LPN   Activities of Daily Living    04/17/2022    1:09 PM  In your present state of health, do you have any difficulty performing the following activities:  Hearing? 0  Vision? 0  Difficulty concentrating or making decisions? 0  Walking or climbing stairs? 1  Dressing or bathing? 0  Doing errands, shopping? 0  Preparing Food and eating ? N  Using the Toilet? N  In the past six months, have you accidently leaked urine? N  Do you have problems with loss of bowel control? N  Managing your Medications? N  Managing your Finances? N  Housekeeping or managing your Housekeeping? N    Patient  Care Team: Chevis Pretty, FNP as PCP - General (Nurse Practitioner)  Indicate any recent Medical Services you may have received from other than [facility] providers in the past year (date may be approximate).     Assessment:   This is Lisa routine wellness examination for Janeshia.  Hearing/Vision screen Hearing Screening - Comments:: No aids Vision Screening - Comments:: Read[facility]- [facility] [place] issues and exercise activities discussed: Current Exercise Habits: Home exercise routine, Type of exercise: walking, Time (Minutes): 20, Frequency (Times/Week): 2, Weekly Exercise (Minutes/Week): 40, Intensity: Mild   Goals Addressed             This Visit's Progress    DIET - EAT MORE FRUITS AND VEGETABLES         Depression Screen    04/17/2022    1:06 PM 01/01/2022   10:41 AM 10/01/2021   10:21 AM 09/13/2021    2:58 PM 04/16/2021    4:09 PM 03/15/2021    4:28 PM 09/14/2020    3:56 PM  PHQ 2/9 Scores  PHQ - 2 Score 0 0 0 0 0 0 0  PHQ- 9 Score 0 0 0 0 0 0 0    Fall Risk    04/17/2022    1:09 PM 01/01/2022   10:41 AM 10/01/2021   10:21 AM 09/13/2021    2:57 PM 04/16/2021    4:11 PM  Fall Risk   Falls in the past year? 0 0 0 0 0  Number falls in past yr: 0    0  Injury with Fall? 0    0  Risk for fall due to : No Fall Risks    Orthopedic patient  Follow up Falls evaluation completed    Falls prevention discussed    FALL RISK PREVENTION PERTAINING TO THE HOME:  Any stairs in or around the home? No  If so, are there any without handrails? No  Home free of loose throw rugs in walkways, pet beds, electrical cords, etc? Yes  Adequate lighting in your home to reduce risk  of falls? Yes   ASSISTIVE DEVICES UTILIZED TO PREVENT FALLS:  Life alert? No  Use of Lisa cane, walker or w/c? No  Grab bars in the bathroom? Yes  Shower chair or bench in shower? Yes  Elevated toilet seat or Lisa handicapped toilet? Yes   Cognitive Function:        04/17/2022    1:10 PM  04/12/2020    9:34 AM 03/01/2019    9:52 AM  6CIT Screen  What Year? 0 points 0 points 0 points  What month? 0 points 0 points 0 points  What time? 0 points 0 points 0 points  Count back from 20 0 points 0 points 0 points  Months in reverse 0 points 0 points 0 points  Repeat phrase 0 points 0 points 2 points  Total Score 0 points 0 points 2 points    Immunizations Immunization History  Administered Date(s) Administered   Influenza,inj,Quad PF,6+ Mos 07/24/2011   Influenza-Unspecified 05/19/2014   PPD Test 09/23/2012, 03/30/2014, 03/27/2015, 03/28/2016, 03/25/2017, 10/06/2018   Pneumococcal Polysaccharide-23 10/16/2004   Tdap 01/31/2011, 03/15/2021    TDAP status: Up to date  Flu Vaccine status: Declined, Education has been provided regarding the importance of this vaccine but patient still declined. Advised may receive this vaccine at local pharmacy or Health Dept. Aware to provide Lisa copy of the vaccination record if obtained from local pharmacy or Health Dept. Verbalized acceptance and understanding.  Pneumococcal vaccine status: Due, Education has been provided regarding the importance of this vaccine. Advised may receive this vaccine at local pharmacy or Health Dept. Aware to provide Lisa copy of the vaccination record if obtained from local pharmacy or Health Dept. Verbalized acceptance and understanding.  Covid-19 vaccine status: Declined, Education has been provided regarding the importance of this vaccine but patient still declined. Advised may receive this vaccine at local pharmacy or Health Dept.or vaccine clinic. Aware to provide Lisa copy of the vaccination record if obtained from local pharmacy or Health Dept. Verbalized acceptance and understanding.  Qualifies for Shingles Vaccine? Yes   Zostavax completed No   Shingrix Completed?: No.    Education has been provided regarding the importance of this vaccine. Patient has been advised to call insurance company to determine out of  pocket expense if they have not yet received this vaccine. Advised may also receive vaccine at local pharmacy or Health Dept. Verbalized acceptance and understanding.  Screening Tests Health Maintenance  Topic Date Due   COVID-19 Vaccine (1) Never done   HIV Screening  Never done   Zoster Vaccines- Shingrix (1 of 2) Never done   COLONOSCOPY (Pts 45-33yr Insurance coverage will need to be confirmed)  02/05/2022   INFLUENZA VACCINE  04/16/2022   MAMMOGRAM  05/17/2023   DEXA SCAN  01/04/2024   PAP SMEAR-Modifier  03/15/2024   TETANUS/TDAP  03/16/2031   Hepatitis C Screening  Completed   HPV VACCINES  Aged Out    Health Maintenance  Health Maintenance Due  Topic Date Due   COVID-19 Vaccine (1) Never done   HIV Screening  Never done   Zoster Vaccines- Shingrix (1 of 2) Never done   COLONOSCOPY (Pts 45-450yrInsurance coverage will need to be confirmed)  02/05/2022   INFLUENZA VACCINE  04/16/2022    Colorectal cancer screening: Type of screening: Colonoscopy. Completed 02/06/12. Repeat every 10 years- declined referral  Mammogram status: Completed 05/16/21. Repeat every year- declined referral  Bone Density status: Completed 01/03/22. Results reflect: Bone density results: OSTEOPOROSIS. Repeat  every 2 years.  Lung Cancer Screening: (Low Dose CT Chest recommended if Age 101-80 years, 30 pack-year currently smoking OR have quit w/in 15years.) does not qualify.   Lung Cancer Screening Referral: declined, says had one done at [facility]  Additional Screening:  Hepatitis C Screening: does qualify; Completed 06/20/16  Vision Screening: Recommended annual ophthalmology exams for early detection of glaucoma and other disorders of the eye. Is the patient up to date with their annual eye exam?  Yes  Who is the provider or what is the name of the office in which the patient attends annual eye exams? [facility] [place] If pt is not established with Lisa provider, would they like to be  referred to Lisa provider to establish care? No .   Dental Screening: Recommended annual dental exams for proper oral hygiene  Community Resource Referral / Chronic Care Management: CRR required this visit?  No   CCM required this visit?  No      Plan:     I have personally reviewed and noted the following in the patient's chart:   Medical and social history Use of alcohol, tobacco or illicit drugs  Current medications and supplements including opioid prescriptions.  Functional ability and status Nutritional status Physical activity Advanced directives List of other physicians Hospitalizations, surgeries, and ER visits in previous 12 months Vitals Screenings to include cognitive, depression, and falls Referrals and appointments  In addition, I have reviewed and discussed with patient certain preventive protocols, quality metrics, and best practice recommendations. Lisa written personalized care plan for preventive services as well as general preventive health recommendations were provided to patient.     Lisa David, LPN   04/16/7710   Nurse Notes: none

## 2022-06-05 DIAGNOSIS — L409 Psoriasis, unspecified: Secondary | ICD-10-CM | POA: Diagnosis not present

## 2022-06-05 DIAGNOSIS — Z7969 Long term (current) use of other immunomodulators and immunosuppressants: Secondary | ICD-10-CM | POA: Diagnosis not present

## 2022-06-05 DIAGNOSIS — L4050 Arthropathic psoriasis, unspecified: Secondary | ICD-10-CM | POA: Diagnosis not present

## 2022-06-05 DIAGNOSIS — Z8673 Personal history of transient ischemic attack (TIA), and cerebral infarction without residual deficits: Secondary | ICD-10-CM | POA: Diagnosis not present

## 2022-06-05 DIAGNOSIS — M059 Rheumatoid arthritis with rheumatoid factor, unspecified: Secondary | ICD-10-CM | POA: Diagnosis not present

## 2022-06-05 DIAGNOSIS — Z79899 Other long term (current) drug therapy: Secondary | ICD-10-CM | POA: Diagnosis not present

## 2022-07-04 ENCOUNTER — Ambulatory Visit (INDEPENDENT_AMBULATORY_CARE_PROVIDER_SITE_OTHER): Payer: Medicare Other | Admitting: Nurse Practitioner

## 2022-07-04 ENCOUNTER — Encounter: Payer: Self-pay | Admitting: Nurse Practitioner

## 2022-07-04 VITALS — BP 110/69 | HR 66 | Temp 97.4°F | Resp 20 | Ht 65.0 in | Wt 165.0 lb

## 2022-07-04 DIAGNOSIS — R739 Hyperglycemia, unspecified: Secondary | ICD-10-CM

## 2022-07-04 DIAGNOSIS — E782 Mixed hyperlipidemia: Secondary | ICD-10-CM | POA: Diagnosis not present

## 2022-07-04 DIAGNOSIS — M8588 Other specified disorders of bone density and structure, other site: Secondary | ICD-10-CM | POA: Diagnosis not present

## 2022-07-04 DIAGNOSIS — I6930 Unspecified sequelae of cerebral infarction: Secondary | ICD-10-CM | POA: Diagnosis not present

## 2022-07-04 DIAGNOSIS — F17200 Nicotine dependence, unspecified, uncomplicated: Secondary | ICD-10-CM | POA: Diagnosis not present

## 2022-07-04 DIAGNOSIS — I10 Essential (primary) hypertension: Secondary | ICD-10-CM

## 2022-07-04 DIAGNOSIS — L4050 Arthropathic psoriasis, unspecified: Secondary | ICD-10-CM

## 2022-07-04 DIAGNOSIS — Z6829 Body mass index (BMI) 29.0-29.9, adult: Secondary | ICD-10-CM

## 2022-07-04 DIAGNOSIS — F3342 Major depressive disorder, recurrent, in full remission: Secondary | ICD-10-CM

## 2022-07-04 LAB — BAYER DCA HB A1C WAIVED: HB A1C (BAYER DCA - WAIVED): 6.8 % — ABNORMAL HIGH (ref 4.8–5.6)

## 2022-07-04 MED ORDER — PREDNISONE 10 MG (21) PO TBPK: ORAL_TABLET | ORAL | 0 refills | Status: DC

## 2022-07-04 NOTE — Patient Instructions (Signed)
Psoriatic Arthritis Psoriatic arthritis, or PsA, is a long-term (chronic) condition that causes pain, swelling, and stiffness in the joints. The large joints of the legs, hips, and pelvis are most often affected but it can affect any joint in your body. Most people with PsA have a chronic skin disease that causes itchy scales and patches to form on the skin (psoriasis) before they develop PsA. In some cases, a person may have PsA before or without having psoriasis. PsA can be mild or severe. If untreated, PsA may cause joint damage. What are the causes? The exact cause of this condition is not known. PsA is an autoimmune disease. With this type of disease, the body's defense system (immune system) mistakenly attacks joints and the tissues that connect muscles to joints (tendons). The disease may be activated by a trigger, such as: Infections. Stress. Alcohol. What increases the risk? You are more likely to develop this condition if: You have psoriasis. You have a family history of psoriasis or PsA. You are between the ages of 1 and 39. What are the signs or symptoms? The main symptom of this condition is inflammation of joints and tendons. Other symptoms may include: Joint pain or swelling. Joint stiffness, especially in the morning. Swollen fingers and toes. Pain in areas where tendons connect to bones. Pitted and weak nails. Tiredness (fatigue). Eye redness. Symptoms of this condition vary from person to person. Symptoms may come and go. Sometimes, symptoms get worse for a period of time. This is called a flare. How is this diagnosed? This condition may be diagnosed based on: Your symptoms and medical history. A physical exam. Imaging tests such as an X-ray, a CT scan, or an MRI. Blood tests to look for inflammation and to rule out other causes, such as gout or rheumatoid arthritis. You may also be referred to a health care provider who specializes in treating this condition  (rheumatologist). How is this treated? The goal of treatment is to reduce pain and inflammation and to protect joints from damage. Treatment depends on how severe the inflammation is and how many joints are affected. Treatment may include: Medicines. NSAIDs, such as ibuprofen or naproxen. Steroids. These can be taken by mouth or injected into a swollen joint. Disease-modifying anti-rheumatic drugs (DMARDs). These slow the course of the disease. Biologic medicines. These medicines are usually given as injections or through an IV. They can be very effective, but these medicines can increase the risk of developing infections. Physical therapy and other exercises to: Strengthen muscles that support joints. Prevent joint stiffness. Lifestyle changes. It is important to rest as needed, eat a healthy diet, and exercise. A brace or splint to support a painful and swollen joint. Surgery if you have severe joint damage. Management of any associated medical conditions. Inflammation from PsA can affect other parts of the body, including the heart, eyes, or kidneys. Follow these instructions at home: Managing pain, stiffness, and swelling  If directed, put ice on painful areas. To do this: If you have a removable brace or splint, remove it as told by your health care provider. Put ice in a plastic bag. Place a towel between your skin and the bag. Leave the ice on for 20 minutes, 2-3 times a day. Remove the ice if your skin turns bright red. This is very important. If you cannot feel pain, heat, or cold, you have a greater risk of damage to the area. If you have a removable brace or splint: Wear the brace or  splint as told by your health care provider. Remove it only as told by your health care provider. Check the skin around the brace or splint every day. Tell your health care provider about any concerns. Loosen the brace or splint if your fingers or toes tingle, become numb, or turn cold and  blue. Keep the brace or splint clean and dry. Activity Return to your normal activities as told by your health care provider. Ask your health care provider what activities are safe for you. Get regular exercise. Ask your health care provider what type of exercise is best for you. Your health care provider may recommend: Low-impact exercises such as walking, biking, or swimming. Exercises that include stretching, such as tai chi and yoga. Do not exercise when you have a flare of symptoms. Rest until the symptoms improve. Lifestyle  Maintain a healthy weight. A healthy weight will help you stay active and take stress off your joints. Eat a healthy diet that includes plenty of vegetables, fruits, whole grains, low-fat dairy products, and lean protein. Do not eat a lot of foods that are high in solid fats, added sugars, or salt. Use techniques for stress reduction, such as meditation or yoga. Do not use any products that contain nicotine or tobacco. These products include cigarettes, chewing tobacco, and vaping devices, such as e-cigarettes. If you need help quitting, ask your health care provider. Consider joining a PsA support group. General instructions Take over-the-counter and prescription medicines only as told by your health care provider. Keep a journal to help track your symptoms. Try to avoid any triggers. Do not drink alcohol if your health care provider tells you not to drink. See a mental health therapist if you feel sad, anxious, frustrated, and hopeless. Managing this condition can be challenging and you may feel overwhelmed and depressed. Keep all follow-up visits. Your health care provider may monitor your condition over time to make sure that it does not cause problems or get worse. Where to find [place]: www.psoriasis.org Where to find more information American Academy of Dermatology: http://jones-macias.info/ American College of Rheumatology:  www.rheumatology.org Contact a health care provider if: You have a fever or chills. Your symptoms get worse. You feel depressed, frustrated, and hopeless about your condition. Get help right away if: You have thoughts of hurting yourself or others. Get help right away if you feel like you may hurt yourself or others, or have thoughts about taking your own life. Go to your nearest emergency room or: Call 911. Call the [facility] at (772)723-4137 or 988. This is open 24 hours a day. Text the Crisis Text Line at 563-070-3866. Summary Psoriatic arthritis, or PsA, is a long-term condition that causes pain, swelling, and stiffness in the joints. The goal of treatment is to reduce pain and inflammation and to protect joints from damage. Treatment depends on how severe the inflammation is and how many joints are affected. This information is not intended to replace advice given to you by your health care provider. Make sure you discuss any questions you have with your health care provider. Document Revised: 10/22/2021 Document Reviewed: 10/22/2021 Elsevier Patient Education  [place].

## 2022-07-04 NOTE — Progress Notes (Signed)
Subjective:    Patient ID: Lisa Ball, female    DOB: 1961/07/30, 61 y.o.   MRN: [id number]   Chief Complaint: medical management of chronic issues   HPI:  Lisa Ball is a 61 y.o. who identifies as a female who was assigned female at birth.   Social history: Lives with: cousin who is handicap lives with her Work history: caregiver   Comes in today for follow up of the following chronic medical issues:  1. Primary hypertension No c/o chest pain, sob or headaches. Does not check BP at home. BP Readings from Last 3 Encounters:  07/04/22 110/69  01/01/22 107/66  10/01/21 96/62     2. Mixed hyperlipidemia Does not watch diet; no dedicated exercise. Lab Results  Component Value Date   CHOL 107 01/01/2022   HDL 21 (L) 01/01/2022   LDLCALC 53 01/01/2022   TRIG 198 (H) 01/01/2022   CHOLHDL 5.1 (H) 01/01/2022   The ASCVD Risk score (Arnett DK, et al., 2019) failed to calculate for the following reasons:   The patient has a prior MI or stroke diagnosis   3. Psoriatic arthritis [facility]) Sees rheumatology every 3 months; is now on secukinumab injections monthly instead of tofacitinib. Reports arthritis in knees has been flaring up lately.  4. Osteopenia of lumbar spine Last dexa scan done 01/03/22; t-score was -0.1; does no weight bearing exercises.  5. Hyperglycemia Does not check blood sugars at home. Lab Results  Component Value Date   HGBA1C 6.2 (H) 01/01/2022     6. Late effect of cerebrovascular accident (CVA) No permanent effects.  7. Recurrent major depressive disorder, in full remission ([place]) On lexapro and doing well.    07/04/2022   10:00 AM 04/17/2022    1:06 PM 01/01/2022   10:41 AM 10/01/2021   10:21 AM 09/13/2021    2:58 PM  Depression screen PHQ 2/9  Decreased Interest 0 0 0 0 0  Down, Depressed, Hopeless 0 0 0 0 0  PHQ - 2 Score 0 0 0 0 0  Altered sleeping 0 0 0 0 0  Tired, decreased energy 0 0 0 0 0  Change in appetite 0 0 0 0 0   Feeling bad or failure about yourself  0 0 0 0 0  Trouble concentrating 0 0 0 0 0  Moving slowly or fidgety/restless 0 0 0 0 0  Suicidal thoughts 0 0 0 0 0  PHQ-9 Score 0 0 0 0 0  Difficult doing work/chores Not difficult at all Not difficult at all Not difficult at all Not difficult at all Not difficult at all     8. BMI 29.0-29.9,adult Weight is down 8 lbs since last check. Wt Readings from Last 3 Encounters:  07/04/22 165 lb (74.8 kg)  04/17/22 173 lb (78.5 kg)  01/01/22 173 lb (78.5 kg)   BMI Readings from Last 3 Encounters:  07/04/22 27.46 kg/m  04/17/22 28.79 kg/m  01/01/22 28.79 kg/m     9. Smoker Does currently smoke about a pack/day.   New complaints: None today  Allergies  Allergen Reactions   Methotrexate Derivatives Shortness Of Breath   Penicillins Other (See Comments)    Couldn't  move   Outpatient Encounter Medications as of 07/04/2022  Medication Sig   albuterol (VENTOLIN HFA) 108 (90 Base) MCG/ACT inhaler Inhale 2 puffs into the lungs every 6 (six) hours as needed for wheezing or shortness of breath. (Patient not taking: Reported on 04/17/2022)   atorvastatin (  LIPITOR) 40 MG tablet Take 1 tablet (40 mg total) by mouth daily.   doxycycline (VIBRA-TABS) 100 MG tablet Take 1 tablet (100 mg total) by mouth 2 (two) times daily. 1 po bid (Patient not taking: Reported on 04/17/2022)   escitalopram (LEXAPRO) 20 MG tablet Take 1 tablet (20 mg total) by mouth daily.   fenofibrate 160 MG tablet Take 1 tablet (160 mg total) by mouth daily.   lisinopril-hydrochlorothiazide (ZESTORETIC) 20-12.5 MG tablet Take 1 tablet by mouth daily.   metFORMIN (GLUCOPHAGE) 500 MG tablet Take 1 tablet (500 mg total) by mouth daily.   Secukinumab (COSENTYX SENSOREADY PEN) 150 MG/ML SOAJ Inject the contents of 1 pen into the skin every 4 weeks   No facility-administered encounter medications on file as of 07/04/2022.    Past Surgical History:  Procedure Laterality Date   BREAST  CYST EXCISION Left    many years ago   CHOLECYSTECTOMY     TUBAL LIGATION      Family History  Problem Relation Age of Onset   Heart disease Mother    Cancer Father 26       lung cancer-died at age 65   Diabetes Sister    Seizures Maternal Grandmother    Heart disease Maternal Grandfather    Seizures Paternal Grandmother    Heart disease Paternal Grandfather    Breast cancer Paternal Aunt    Colon cancer Neg Hx    Esophageal cancer Neg Hx    Rectal cancer Neg Hx    Stomach cancer Neg Hx       Controlled substance contract: n/a     Review of Systems  Constitutional:  Negative for diaphoresis.  Eyes:  Negative for pain.  Respiratory:  Negative for chest tightness and shortness of breath.   Cardiovascular:  Negative for chest pain, palpitations and leg swelling.  Gastrointestinal:  Negative for abdominal pain.  Endocrine: Negative for polydipsia and polyphagia.  Musculoskeletal:  Positive for arthralgias.       Arthritis in knees flaring up  Skin:  Negative for rash.  Neurological:  Negative for dizziness, weakness and headaches.  Hematological:  Negative for adenopathy. Does not bruise/bleed easily.  All other systems reviewed and are negative.      Objective:   Physical Exam Vitals and nursing note reviewed.  Constitutional:      General: She is not in acute distress.    Appearance: Normal appearance. She is not ill-appearing.  HENT:     Head: Normocephalic.     Right Ear: Tympanic membrane normal.     Left Ear: Tympanic membrane normal.     Nose: Nose normal.     Mouth/Throat:     Pharynx: Oropharynx is clear.  Eyes:     Conjunctiva/sclera: Conjunctivae normal.     Pupils: Pupils are equal, round, and reactive to light.  Neck:     Vascular: No JVD.  Cardiovascular:     Rate and Rhythm: Normal rate and regular rhythm.     Pulses: Normal pulses.     Heart sounds: Normal heart sounds.  Pulmonary:     Effort: Pulmonary effort is normal. No respiratory  distress.     Breath sounds: Normal breath sounds. No wheezing, rhonchi or rales.  Chest:     Chest wall: No tenderness.  Abdominal:     General: Bowel sounds are normal. There is no distension.     Palpations: There is no mass.     Tenderness: There is no abdominal tenderness.  Musculoskeletal:        General: Normal range of motion.     Cervical back: Normal range of motion.     Right knee: Swelling present. No erythema. Tenderness present.     Left knee: Swelling present. No erythema. Tenderness present.  Lymphadenopathy:     Cervical: No cervical adenopathy.  Skin:    General: Skin is warm and dry.     Capillary Refill: Capillary refill takes less than 2 seconds.  Neurological:     Mental Status: She is alert and oriented to person, place, and time.  Psychiatric:        Behavior: Behavior normal.       BP 110/69   Pulse 66   Temp (!) 97.4 F (36.3 C) (Temporal)   Resp 20   Ht '5\' 5"'  (1.651 m)   Wt 165 lb (74.8 kg)   SpO2 93%   BMI 27.46 kg/m   HbA1c 6.8%    Assessment & Plan:   Lisa Ball comes in today with chief complaint of Medical Management of Chronic Issues   Diagnosis and orders addressed:  1. Primary hypertension [place] sodium diet - CBC with Differential/Platelet - CMP14+EGFR  2. Mixed hyperlipidemia Low fat diet - Lipid panel  3. Psoriatic arthritis [facility]) Patient will let rheumatology know she is on steroids. - predniSONE (STERAPRED UNI-PAK 21 TAB) 10 MG (21) TBPK tablet; As directed x 6 days  Dispense: 21 tablet; Refill: 0  4. Osteopenia of lumbar spine Weight bearing exercise  5. Hyperglycemia Low fat diet - Bayer DCA Hb A1c Waived - Microalbumin / creatinine urine ratio  6. Late effect of cerebrovascular accident (CVA)  7. Recurrent major depressive disorder, in full remission ([place]) Stress management  8. BMI 29.0-29.9,adult Discussed diet and exercise for person with BMI >25 Will recheck weight in 3-6 months   9.  Smoker Smoking cessation encouraged   Labs pending Health Maintenance reviewed Diet and exercise encouraged  Follow up plan: 2 months   Mary-Margaret Hassell Done, FNP   Collene Leyden, FNP student

## 2022-07-05 LAB — CBC WITH DIFFERENTIAL/PLATELET
Basophils Absolute: 0.1 10*3/uL (ref 0.0–0.2)
Basos: 1 %
EOS (ABSOLUTE): 0.3 10*3/uL (ref 0.0–0.4)
Eos: 4 %
Hematocrit: 44.7 % (ref 34.0–46.6)
Hemoglobin: 14.8 g/dL (ref 11.1–15.9)
Immature Grans (Abs): 0 10*3/uL (ref 0.0–0.1)
Immature Granulocytes: 0 %
Lymphocytes Absolute: 2.3 10*3/uL (ref 0.7–3.1)
Lymphs: 30 %
MCH: 29.1 pg (ref 26.6–33.0)
MCHC: 33.1 g/dL (ref 31.5–35.7)
MCV: 88 fL (ref 79–97)
Monocytes Absolute: 0.9 10*3/uL (ref 0.1–0.9)
Monocytes: 11 %
Neutrophils Absolute: 4.1 10*3/uL (ref 1.4–7.0)
Neutrophils: 54 %
Platelets: 398 10*3/uL (ref 150–450)
RBC: 5.08 x10E6/uL (ref 3.77–5.28)
RDW: 12.9 % (ref 11.7–15.4)
WBC: 7.6 10*3/uL (ref 3.4–10.8)

## 2022-07-05 LAB — CMP14+EGFR
ALT: 9 IU/L (ref 0–32)
AST: 12 IU/L (ref 0–40)
Albumin/Globulin Ratio: 1.5 (ref 1.2–2.2)
Albumin: 4 g/dL (ref 3.9–4.9)
Alkaline Phosphatase: 51 IU/L (ref 44–121)
BUN/Creatinine Ratio: 13 (ref 12–28)
BUN: 11 mg/dL (ref 8–27)
Bilirubin Total: 0.5 mg/dL (ref 0.0–1.2)
CO2: 31 mmol/L — ABNORMAL HIGH (ref 20–29)
Calcium: 9.8 mg/dL (ref 8.7–10.3)
Chloride: 94 mmol/L — ABNORMAL LOW (ref 96–106)
Creatinine, Ser: 0.82 mg/dL (ref 0.57–1.00)
Globulin, Total: 2.7 g/dL (ref 1.5–4.5)
Glucose: 125 mg/dL — ABNORMAL HIGH (ref 70–99)
Potassium: 3.2 mmol/L — ABNORMAL LOW (ref 3.5–5.2)
Sodium: 136 mmol/L (ref 134–144)
Total Protein: 6.7 g/dL (ref 6.0–8.5)
eGFR: 81 mL/min/{1.73_m2} (ref >59)

## 2022-07-05 LAB — LIPID PANEL
Chol/HDL Ratio: 4.5 ratio — ABNORMAL HIGH (ref 0.0–4.4)
Cholesterol, Total: 113 mg/dL (ref 100–199)
HDL: 25 mg/dL — ABNORMAL LOW (ref >39)
LDL Chol Calc (NIH): 56 mg/dL (ref 0–99)
Triglycerides: 191 mg/dL — ABNORMAL HIGH (ref 0–149)
VLDL Cholesterol Cal: 32 mg/dL (ref 5–40)

## 2022-07-05 LAB — MICROALBUMIN / CREATININE URINE RATIO
Creatinine, Urine: 77.8 mg/dL
Microalb/Creat Ratio: 22 mg/g creat (ref 0–29)
Microalbumin, Urine: 16.8 ug/mL

## 2022-09-12 ENCOUNTER — Telehealth (INDEPENDENT_AMBULATORY_CARE_PROVIDER_SITE_OTHER): Payer: Medicare Other | Admitting: Nurse Practitioner

## 2022-09-12 ENCOUNTER — Encounter: Payer: Self-pay | Admitting: Nurse Practitioner

## 2022-09-12 DIAGNOSIS — M8588 Other specified disorders of bone density and structure, other site: Secondary | ICD-10-CM | POA: Diagnosis not present

## 2022-09-12 DIAGNOSIS — I10 Essential (primary) hypertension: Secondary | ICD-10-CM | POA: Diagnosis not present

## 2022-09-12 DIAGNOSIS — E782 Mixed hyperlipidemia: Secondary | ICD-10-CM | POA: Diagnosis not present

## 2022-09-12 DIAGNOSIS — L4050 Arthropathic psoriasis, unspecified: Secondary | ICD-10-CM | POA: Diagnosis not present

## 2022-09-12 DIAGNOSIS — L409 Psoriasis, unspecified: Secondary | ICD-10-CM

## 2022-09-12 DIAGNOSIS — R0981 Nasal congestion: Secondary | ICD-10-CM | POA: Diagnosis not present

## 2022-09-12 DIAGNOSIS — F3342 Major depressive disorder, recurrent, in full remission: Secondary | ICD-10-CM

## 2022-09-12 DIAGNOSIS — E119 Type 2 diabetes mellitus without complications: Secondary | ICD-10-CM

## 2022-09-12 DIAGNOSIS — F17200 Nicotine dependence, unspecified, uncomplicated: Secondary | ICD-10-CM

## 2022-09-12 DIAGNOSIS — I6930 Unspecified sequelae of cerebral infarction: Secondary | ICD-10-CM

## 2022-09-12 DIAGNOSIS — Z6829 Body mass index (BMI) 29.0-29.9, adult: Secondary | ICD-10-CM

## 2022-09-12 MED ORDER — ATORVASTATIN CALCIUM 40 MG PO TABS: 40.0000 mg | ORAL_TABLET | Freq: Every day | ORAL | 1 refills | Status: DC

## 2022-09-12 MED ORDER — ESCITALOPRAM OXALATE 20 MG PO TABS: 20.0000 mg | ORAL_TABLET | Freq: Every day | ORAL | 1 refills | Status: DC

## 2022-09-12 MED ORDER — METFORMIN HCL 500 MG PO TABS: 500.0000 mg | ORAL_TABLET | Freq: Every day | ORAL | 1 refills | Status: DC

## 2022-09-12 MED ORDER — FENOFIBRATE 160 MG PO TABS: 160.0000 mg | ORAL_TABLET | Freq: Every day | ORAL | 1 refills | Status: DC

## 2022-09-12 MED ORDER — FLUTICASONE PROPIONATE 50 MCG/ACT NA SUSP: 2.0000 | Freq: Every day | NASAL | 6 refills | Status: DC

## 2022-09-12 MED ORDER — LISINOPRIL-HYDROCHLOROTHIAZIDE 20-12.5 MG PO TABS: 1.0000 | ORAL_TABLET | Freq: Every day | ORAL | 1 refills | Status: DC

## 2022-09-12 NOTE — Patient Instructions (Signed)

## 2022-09-12 NOTE — Progress Notes (Deleted)
Subjective:    Patient ID: Lisa Ball, female    DOB: 01-07-1961, 61 y.o.   MRN: [id number]   Chief Complaint: medical management of chronic issues     HPI:  Lisa Ball is a 61 y.o. who identifies as a female who was assigned female at birth.   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 Primary hypertension No c/o chest pain, sob or headache. Doe snot check blodpressure at home. BP Readings from Last 3 Encounters:  07/04/22 110/69  01/01/22 107/66  10/01/21 96/62     2. Mixed hyperlipidemia Doe n ot watch  diet and does no dedicated exercise. Lab Results  Component Value Date   CHOL 113 07/04/2022   HDL 25 (L) 07/04/2022   LDLCALC 56 07/04/2022   TRIG 191 (H) 07/04/2022   CHOLHDL 4.5 (H) 07/04/2022   .  3. Hyperglycemia Blood sugar was 125 last check. Not sure if was fasting or not. Lab Results  Component Value Date   HGBA1C 6.8 (H) 07/04/2022     4. Late effect of cerebrovascular accident (CVA) ***  5. Psoriasis 6. Psoriatic arthritis [facility]) Sees rheumatology every 6 months. Is maintaining on cosentyx.  7. Osteopenia of lumbar spine Last dexascan was done on 01/03/22. Her t score was -0. Which was normal.  8. Recurrent major depressive disorder, in full remission ([place]) Is on lexapro an dis doing well.  9. Smoker Smokes at least a pack a day.  10. BMI 29.0-29.9,adult No recent weight changes   New complaints: ***  Allergies  Allergen Reactions   Methotrexate Derivatives Shortness Of Breath   Penicillins Other (See Comments)    Couldn't  move   Outpatient Encounter Medications as of 09/12/2022  Medication Sig   albuterol (VENTOLIN HFA) 108 (90 Base) MCG/ACT inhaler Inhale 2 puffs into the lungs every 6 (six) hours as needed for wheezing or shortness of breath.   atorvastatin (LIPITOR) 40 MG tablet Take 1 tablet (40 mg  total) by mouth daily.   escitalopram (LEXAPRO) 20 MG tablet Take 1 tablet (20 mg total) by mouth daily.   fenofibrate 160 MG tablet Take 1 tablet (160 mg total) by mouth daily.   lisinopril-hydrochlorothiazide (ZESTORETIC) 20-12.5 MG tablet Take 1 tablet by mouth daily.   metFORMIN (GLUCOPHAGE) 500 MG tablet Take 1 tablet (500 mg total) by mouth daily.   predniSONE (STERAPRED UNI-PAK 21 TAB) 10 MG (21) TBPK tablet As directed x 6 days   Secukinumab (COSENTYX SENSOREADY PEN) 150 MG/ML SOAJ Inject the contents of 1 pen into the skin every 4 weeks   No facility-administered encounter medications on file as of 09/12/2022.    Past Surgical History:  Procedure Laterality Date   BREAST CYST EXCISION Left    many years ago   CHOLECYSTECTOMY     TUBAL LIGATION      Family History  Problem Relation Age of Onset   Heart disease Mother    Cancer Father 100       lung cancer-died at age 33   Diabetes Sister    Seizures Maternal Grandmother    Heart disease Maternal Grandfather    Seizures Paternal Grandmother    Heart disease Paternal Grandfather    Breast cancer Paternal Aunt    Colon cancer Neg Hx    Esophageal cancer Neg Hx    Rectal cancer Neg Hx  Stomach cancer Neg Hx       Controlled substance contract: ***     Review of Systems     Objective:   Physical Exam        Assessment & Plan:

## 2022-09-12 NOTE — Progress Notes (Signed)
Virtual Visit  Note Due to COVID-19 pandemic this visit was conducted virtually. This visit type was conducted due to national recommendations for restrictions regarding the COVID-19 Pandemic (e.g. social distancing, sheltering in place) in an effort to limit this patient's exposure and mitigate transmission in our community. All issues noted in this document were discussed and addressed.  A physical exam was not performed with this format.  I connected with Lisa Ball on 09/12/22 at 10:30 by telephone and verified that I am speaking with the correct person using two identifiers. Lisa Ball is currently located at home and no one is currently with  her during visit. The provider, Mary-Margaret Hassell Done, FNP is located in their office at time of visit.  I discussed the limitations, risks, security and privacy concerns of performing an evaluation and management service by telephone and the availability of in person appointments. I also discussed with the patient that there may be a patient responsible charge related to this service. The patient expressed understanding and agreed to proceed.   History and Present Illness:   HPI:  Lisa Ball is a 61 y.o. who identifies as a female who was assigned female at birth.   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 Primary hypertension No c/o chest pain, sob or headache. Doe snot check blodpressure at home. BP Readings from Last 3 Encounters:  07/04/22 110/69  01/01/22 107/66  10/01/21 96/62     2. Mixed hyperlipidemia Doe n ot watch  diet and does no dedicated exercise. Lab Results  Component Value Date   CHOL 113 07/04/2022   HDL 25 (L) 07/04/2022   LDLCALC 56 07/04/2022   TRIG 191 (H) 07/04/2022   CHOLHDL 4.5 (H) 07/04/2022   .  3. Hyperglycemia Blood sugar was 125 last check. Not sure if was fasting or  not. Lab Results  Component Value Date   HGBA1C 6.8 (H) 07/04/2022     4. Late effect of cerebrovascular accident (CVA) No permanent effects from stroke  5. Psoriasis 6. Psoriatic arthritis [facility]) Sees rheumatology every 6 months. Is maintaining on cosentyx.  7. Osteopenia of lumbar spine Last dexascan was done on 01/03/22. Her t score was -0. Which was normal.  8. Recurrent major depressive disorder, in full remission ([place]) Is on lexapro an dis doing well.    09/12/2022   10:46 AM 07/04/2022   10:00 AM 04/17/2022    1:06 PM  Depression screen PHQ 2/9  Decreased Interest 0 0 0  Down, Depressed, Hopeless 0 0 0  PHQ - 2 Score 0 0 0  Altered sleeping 0 0 0  Tired, decreased energy 1 0 0  Change in appetite 0 0 0  Feeling bad or failure about yourself  0 0 0  Trouble concentrating 0 0 0  Moving slowly or fidgety/restless 0 0 0  Suicidal thoughts 0 0 0  PHQ-9 Score 1 0 0  Difficult doing work/chores Not difficult at all Not difficult at all Not difficult at all    9. Smoker Smokes at least a pack a day.  10. BMI 29.0-29.9,adult No recent weight changes that she is aware of   New complaints: Sinus congestion  Allergies  Allergen Reactions   Methotrexate Derivatives Shortness Of Breath   Penicillins Other (See Comments)    Couldn't  move   Outpatient Encounter Medications as of 09/12/2022  Medication Sig   albuterol (VENTOLIN HFA) 108 (90 Base) MCG/ACT inhaler Inhale 2 puffs into the lungs every 6 (six) hours as needed for wheezing or shortness of breath.   atorvastatin (LIPITOR) 40 MG tablet Take 1 tablet (40 mg total) by mouth daily.   escitalopram (LEXAPRO) 20 MG tablet Take 1 tablet (20 mg total) by mouth daily.   fenofibrate 160 MG tablet Take 1 tablet (160 mg total) by mouth daily.   lisinopril-hydrochlorothiazide (ZESTORETIC) 20-12.5 MG tablet Take 1 tablet by mouth daily.   metFORMIN (GLUCOPHAGE) 500 MG tablet Take 1 tablet (500 mg total) by mouth daily.    predniSONE (STERAPRED UNI-PAK 21 TAB) 10 MG (21) TBPK tablet As directed x 6 days   Secukinumab (COSENTYX SENSOREADY PEN) 150 MG/ML SOAJ Inject the contents of 1 pen into the skin every 4 weeks   No facility-administered encounter medications on file as of 09/12/2022.    Past Surgical History:  Procedure Laterality Date   BREAST CYST EXCISION Left    many years ago   CHOLECYSTECTOMY     TUBAL LIGATION      Family History  Problem Relation Age of Onset   Heart disease Mother    Cancer Father 34       lung cancer-died at age 13   Diabetes Sister    Seizures Maternal Grandmother    Heart disease Maternal Grandfather    Seizures Paternal Grandmother    Heart disease Paternal Grandfather    Breast cancer Paternal Aunt    Colon cancer Neg Hx    Esophageal cancer Neg Hx    Rectal cancer Neg Hx    Stomach cancer Neg Hx       Controlled substance contract: n/a    Review of Systems  Constitutional:  Negative for diaphoresis, fever and weight loss.  HENT:  Positive for congestion.   Eyes:  Negative for blurred vision, double vision and pain.  Respiratory:  Negative for shortness of breath.   Cardiovascular:  Negative for chest pain, palpitations, orthopnea and leg swelling.  Gastrointestinal:  Negative for abdominal pain.  Skin:  Negative for rash.  Neurological:  Negative for dizziness, sensory change, loss of consciousness, weakness and headaches.  Endo/Heme/Allergies:  Negative for polydipsia. Does not bruise/bleed easily.  Psychiatric/Behavioral:  Negative for memory loss. The patient does not have insomnia.   All other systems reviewed and are negative.    Observations/Objective: Alert and oriented- answers all questions appropriately No distress   Assessment and Plan: Lisa Ball comes in today with chief complaint of No chief complaint on file.   Diagnosis and orders addressed:  1. Primary hypertension Low sodium diet  2. Mixed hyperlipidemia Low fat  diet - atorvastatin (LIPITOR) 40 MG tablet; Take 1 tablet (40 mg total) by mouth daily.  Dispense: 90 tablet; Refill: 1 - fenofibrate 160 MG tablet; Take 1 tablet (160 mg total) by mouth daily.  Dispense: 90 tablet; Refill: 1  3. Type 2 diabetes mellitus without complication, without long-term current use of insulin ([name]) Low crb diet - metFORMIN (GLUCOPHAGE) 500 MG tablet; Take 1 tablet (500 mg total) by mouth daily.  Dispense: 90 tablet; Refill: 1  4. Late effect of cerebrovascular accident (CVA)  5. Psoriasis 6. Psoriatic arthritis ([place]) Keep follow up with rheumatology  7. Osteopenia of lumbar spine Weight bearing exercises  8. Recurrent major depressive disorder, in full remission ([place]) Stress management - escitalopram (LEXAPRO) 20 MG tablet; Take 1 tablet (20 mg total) by mouth  daily.  Dispense: 90 tablet; Refill: 1  9. Smoker Smoking cessation encouraged  10. BMI 29.0-29.9,adult Discussed diet and exercise for person with BMI >25 Will recheck weight in 3-6 months  11. Sinus congestion - fluticasone (FLONASE) 50 MCG/ACT nasal spray; Place 2 sprays into both nostrils daily.  Dispense: 16 g; Refill: 6  12. Hypertension, unspecified type - lisinopril-hydrochlorothiazide (ZESTORETIC) 20-12.5 MG tablet; Take 1 tablet by mouth daily.  Dispense: 90 tablet; Refill: 1   Labs pending Health Maintenance reviewed Diet and exercise encouraged  Follow up plan: 3 moths in person   I discussed the assessment and treatment plan with the patient. The patient was provided an opportunity to ask questions and all were answered. The patient agreed with the plan and demonstrated an understanding of the instructions.   The patient was advised to call back or seek an in-person evaluation if the symptoms worsen or if the condition fails to improve as anticipated.  The above assessment and management plan was discussed with the patient. The patient verbalized understanding of and has agreed  to the management plan. Patient is aware to call the clinic if symptoms persist or worsen. Patient is aware when to return to the clinic for a follow-up visit. Patient educated on when it is appropriate to go to the emergency department.   Time call ended:  10:53  I provided 23 minutes of  non face-to-face time during this encounter.    Mary-Margaret Hassell Done, FNP

## 2022-09-23 DIAGNOSIS — Y92002 Bathroom of unspecified non-institutional (private) residence single-family (private) house as the place of occurrence of the external cause: Secondary | ICD-10-CM | POA: Diagnosis not present

## 2022-09-23 DIAGNOSIS — E785 Hyperlipidemia, unspecified: Secondary | ICD-10-CM | POA: Diagnosis not present

## 2022-09-23 DIAGNOSIS — I10 Essential (primary) hypertension: Secondary | ICD-10-CM | POA: Diagnosis not present

## 2022-09-23 DIAGNOSIS — Z720 Tobacco use: Secondary | ICD-10-CM | POA: Diagnosis not present

## 2022-09-23 DIAGNOSIS — M25511 Pain in right shoulder: Secondary | ICD-10-CM | POA: Diagnosis not present

## 2022-09-23 DIAGNOSIS — W010XXA Fall on same level from slipping, tripping and stumbling without subsequent striking against object, initial encounter: Secondary | ICD-10-CM | POA: Diagnosis not present

## 2022-09-23 DIAGNOSIS — Y999 Unspecified external cause status: Secondary | ICD-10-CM | POA: Diagnosis not present

## 2022-09-23 DIAGNOSIS — Y939 Activity, unspecified: Secondary | ICD-10-CM | POA: Diagnosis not present

## 2022-09-23 DIAGNOSIS — S43401A Unspecified sprain of right shoulder joint, initial encounter: Secondary | ICD-10-CM | POA: Diagnosis not present

## 2022-09-23 DIAGNOSIS — S4991XA Unspecified injury of right shoulder and upper arm, initial encounter: Secondary | ICD-10-CM | POA: Diagnosis not present

## 2022-09-24 ENCOUNTER — Ambulatory Visit: Payer: Medicare Other | Admitting: Nurse Practitioner

## 2022-10-01 ENCOUNTER — Ambulatory Visit (INDEPENDENT_AMBULATORY_CARE_PROVIDER_SITE_OTHER): Payer: Medicare Other | Admitting: Nurse Practitioner

## 2022-10-01 ENCOUNTER — Encounter: Payer: Self-pay | Admitting: Nurse Practitioner

## 2022-10-01 VITALS — BP 115/76 | HR 70 | Temp 96.9°F | Resp 20 | Ht 65.0 in | Wt 167.0 lb

## 2022-10-01 DIAGNOSIS — M25511 Pain in right shoulder: Secondary | ICD-10-CM | POA: Diagnosis not present

## 2022-10-01 NOTE — Progress Notes (Signed)
Subjective:    Patient ID: Lisa Ball, female    DOB: 29-Jul-1961, 62 y.o.   MRN: [id number]   Chief Complaint: Shoulder Pain [place] on right shoulder January 6 and seen at [facility])   Shoulder Pain    Patient says she was walking  to the bathroom on 09/21/22 and fell. She does not know what caused her to fall. She landed in hr right shoulder. Went to [facility], but xrays showed no fracture. Right shoulder continues to hurt when she tries to raise it. Rates 5-6/10. Holding it still stops the pain.     Review of Systems  Constitutional:  Negative for diaphoresis.  Eyes:  Negative for pain.  Respiratory:  Negative for shortness of breath.   Cardiovascular:  Negative for chest pain, palpitations and leg swelling.  Gastrointestinal:  Negative for abdominal pain.  Endocrine: Negative for polydipsia.  Skin:  Negative for rash.  Neurological:  Negative for dizziness, weakness and headaches.  Hematological:  Does not bruise/bleed easily.  All other systems reviewed and are negative.      Objective:   Physical Exam Vitals and nursing note reviewed.  Constitutional:      General: She is not in acute distress.    Appearance: Normal appearance. She is well-developed.  Neck:     Vascular: No carotid bruit or JVD.  Cardiovascular:     Rate and Rhythm: Normal rate and regular rhythm.     Heart sounds: Normal heart sounds.  Pulmonary:     Effort: Pulmonary effort is normal. No respiratory distress.     Breath sounds: Normal breath sounds. No wheezing or rales.  Chest:     Chest wall: No tenderness.  Abdominal:     General: Bowel sounds are normal. There is no distension or abdominal bruit.     Palpations: Abdomen is soft. There is no hepatomegaly, splenomegaly, mass or pulsatile mass.     Tenderness: There is no abdominal tenderness.  Musculoskeletal:        General: Normal range of motion.     Cervical back: Normal range of motion and neck supple.     Comments:  FROM of right shoulder with pain on abduction at 90 degrees.  Lymphadenopathy:     Cervical: No cervical adenopathy.  Skin:    General: Skin is warm and dry.  Neurological:     Mental Status: She is alert and oriented to person, place, and time.     Deep Tendon Reflexes: Reflexes are normal and symmetric.  Psychiatric:        Behavior: Behavior normal.        Thought Content: Thought content normal.        Judgment: Judgment normal.    BP 115/76   Pulse 70   Temp (!) 96.9 F (36.1 C) (Temporal)   Resp 20   Ht '5\' 5"'$  (1.651 m)   Wt 167 lb (75.8 kg)   SpO2 97%   BMI 27.79 kg/m         Assessment & Plan:  Lisa Ball in today with chief complaint of Shoulder Pain [place] on right shoulder January 6 and seen at [facility])   1. Acute pain of right shoulder Limit acvtivity if hurts Abduction exercises shown Motrin or tylenol OTC Rto prn    The above assessment and management plan was discussed with the patient. The patient verbalized understanding of and has agreed to the management plan. Patient is aware to call the clinic  if symptoms persist or worsen. Patient is aware when to return to the clinic for a follow-up visit. Patient educated on when it is appropriate to go to the emergency department.   Mary-Margaret Hassell Done, FNP

## 2022-10-01 NOTE — Patient Instructions (Signed)

## 2022-10-10 DIAGNOSIS — M059 Rheumatoid arthritis with rheumatoid factor, unspecified: Secondary | ICD-10-CM | POA: Diagnosis not present

## 2022-10-10 DIAGNOSIS — L409 Psoriasis, unspecified: Secondary | ICD-10-CM | POA: Diagnosis not present

## 2022-10-10 DIAGNOSIS — Z79899 Other long term (current) drug therapy: Secondary | ICD-10-CM | POA: Diagnosis not present

## 2022-10-31 NOTE — Progress Notes (Signed)
Attempted to call patient to schedule follow-up LDCT. Unable to reach the patient at this time, detailed VM left asking that the patient return my call.

## 2022-11-27 ENCOUNTER — Ambulatory Visit (INDEPENDENT_AMBULATORY_CARE_PROVIDER_SITE_OTHER): Payer: Medicare Other | Admitting: Family Medicine

## 2022-11-27 ENCOUNTER — Encounter: Payer: Self-pay | Admitting: Family Medicine

## 2022-11-27 VITALS — BP 116/67 | HR 76 | Temp 97.8°F | Ht 65.0 in | Wt 166.0 lb

## 2022-11-27 DIAGNOSIS — R053 Chronic cough: Secondary | ICD-10-CM | POA: Diagnosis not present

## 2022-11-27 DIAGNOSIS — J040 Acute laryngitis: Secondary | ICD-10-CM

## 2022-11-27 MED ORDER — PREDNISONE 10 MG (21) PO TBPK: ORAL_TABLET | ORAL | 0 refills | Status: DC

## 2022-11-27 NOTE — Patient Instructions (Signed)
Hoarseness  Hoarseness, also called dysphonia, is any abnormal change in your voice that can make it difficult to speak. Your voice may sound raspy, breathy, or strained. Hoarseness is caused by a problem with your vocal cords (vocal folds). These are two bands of tissue inside your voice box (larynx). When you speak, your vocal cords move back and forth to create sound. The surfaces of your vocal cords need to be smooth for your voice to sound clear. Swelling or lumps on your vocal cords can cause hoarseness. Vocal cord problems may be the result of injuries or abnormal growths, certain diseases, upper respiratory infection, or allergies. Other causes may include medicine side effects and exposure to irritants. Follow these instructions at home:  Pay attention to any changes in your symptoms. Take these actions to stay safe and to help relieve your symptoms: Lifestyle Do not eat foods that give you heartburn, such as spicy or acidic foods like hot peppers and orange juice. These foods can cause a gastroesophageal reflux that may worsen your vocal cord problems. Limit how much alcohol and caffeine you drink as told by your health care provider. Drink enough fluid to keep your urine pale yellow. Do not use any products that contain nicotine or tobacco. These products include cigarettes, chewing tobacco, and vaping devices, such as e-cigarettes. If you need help quitting, ask your health care provider. Avoid secondhand smoke. General instructions Use a humidifier if the air in your home is dry. Avoid coughing or clearing your throat. Do not whisper. Whispering can cause muscle strain. Do not speak in a loud or harsh voice. Rest your voice. If recommended by your health care provider, schedule an appointment with a speech-language specialist. This specialist may give you methods to try that can help you avoid misusing your voice. Contact a health care provider if: Your voice is hoarse longer than  2 weeks. You almost lose or completely lose your voice for more than 3 days. You have pain when you swallow or try to talk. You feel a lump in your neck. Get help right away if: You have trouble swallowing. You feel like you are choking when you swallow. You cough up blood or vomit blood. You have trouble breathing. You choke, cannot swallow, or cannot breathe if you lie flat. You notice swelling or a rash on your body, face, or tongue. These symptoms may represent a serious problem that is an emergency. Do not wait to see if the symptoms will go away. Get medical help right away. Call your local emergency services (911 in the [place] Do not drive yourself to the hospital. Summary Hoarseness, also called dysphonia, is any abnormal change in your voice that can make it difficult to speak. Your voice may sound raspy, breathy, or strained. Hoarseness is caused by a problem with your vocal cords (vocal folds). Do not speak in a loud or harsh voice, whisper, use nicotine or tobacco products, or eat foods that give you heartburn. See your health care provider if your hoarseness does not improve after 2 weeks. This information is not intended to replace advice given to you by your health care provider. Make sure you discuss any questions you have with your health care provider. Document Revised: 02/20/2021 Document Reviewed: 02/20/2021 Elsevier Patient Education  2023 Elsevier Inc.  

## 2022-11-27 NOTE — Progress Notes (Signed)
   Acute Office Visit  Subjective:     Patient ID: Lisa Ball, female    DOB: May 04, 1961, 62 y.o.   MRN: [id number]  Chief Complaint  Patient presents with   Hoarse         HPI Patient is in today for hoarseness for 2 weeks. She does report nasal congestion for about 1 week. She did have a cough for months that continues at night time. Denies sore throat, fever, trouble swallowing, fever, chills, night sweats, shortness of breath, wheezing, or chest pain. Denies epigastric pain, heartburn, water brash.    ROS As per HPI.      Objective:    BP 116/67   Pulse 76   Temp 97.8 F (36.6 C) (Temporal)   Ht 5\' 5"  (1.651 m)   Wt 166 lb (75.3 kg)   SpO2 92%   BMI 27.62 kg/m    Physical Exam Vitals and nursing note reviewed.  Constitutional:      Appearance: Normal appearance.  HENT:     Head: Normocephalic and atraumatic.     Nose: Nose normal.     Mouth/Throat:     Lips: Pink.     Mouth: Mucous membranes are moist.     Tongue: No lesions.     Palate: No mass.     Pharynx: Oropharynx is clear. No pharyngeal swelling, oropharyngeal exudate, posterior oropharyngeal erythema or uvula swelling.     Tonsils: No tonsillar exudate or tonsillar abscesses.  Neck:     Thyroid: No thyroid mass, thyromegaly or thyroid tenderness.  Cardiovascular:     Rate and Rhythm: Normal rate and regular rhythm.     Heart sounds: Normal heart sounds. No murmur heard. Pulmonary:     Effort: Pulmonary effort is normal. No respiratory distress.     Breath sounds: Normal breath sounds.  Musculoskeletal:     Cervical back: Neck supple. No rigidity.  Lymphadenopathy:     Cervical: No cervical adenopathy.  Skin:    General: Skin is warm and dry.  Neurological:     General: No focal deficit present.     Mental Status: She is alert and oriented to person, place, and time.  Psychiatric:        Mood and Affect: Mood normal.        Behavior: Behavior normal.     No results found for any  visits on 11/27/22.      Assessment & Plan:   Lisa Ball was seen today for hoarse.  Diagnoses and all orders for this visit:  Laryngitis Acute URI stymptoms as well. Benign exam. Prednisone taper as below.   -     predniSONE (STERAPRED UNI-PAK 21 TAB) 10 MG (21) TBPK tablet; Use as directed on back of pill pack  Chronic cough Denies GERD symptoms. Deneis wheezing or shortness of breath. Try OTC daily antihistamine.    Return if symptoms worsen or fail to improve.  The patient indicates understanding of these issues and agrees with the plan.   Gwenlyn Perking, FNP

## 2022-11-29 NOTE — Addendum Note (Signed)
Addended by: Gwenlyn Perking on: 11/29/2022 02:51 PM   Modules accepted: Level of Service

## 2022-12-10 ENCOUNTER — Encounter: Payer: Self-pay | Admitting: Nurse Practitioner

## 2022-12-30 NOTE — Patient Instructions (Incomplete)
Our records indicate that you are due for your screening mammogram.  Please call the imaging center that does your yearly mammograms to make an appointment for a mammogram at your earliest convenience. Our office also has a mobile unit through the [facility] that comes to our location. Please call our office if you would like to make an appointment.  Carbohydrate Counting for Diabetes Mellitus, Adult Carbohydrate counting is a method of keeping track of how many carbohydrates you eat. Eating carbohydrates increases the amount of sugar (glucose) in the blood. Counting how many carbohydrates you eat improves how well you manage your blood glucose. This, in turn, helps you manage your diabetes. Carbohydrates are measured in grams (g) per serving. It is important to know how many carbohydrates (in grams or by serving size) you can have in each meal. This is different for every person. A dietitian can help you make a meal plan and calculate how many carbohydrates you should have at each meal and snack. What foods contain carbohydrates? Carbohydrates are found in the following foods: Grains, such as breads and cereals. Dried beans and soy products. Starchy vegetables, such as potatoes, peas, and corn. Fruit and fruit juices. Milk and yogurt. Sweets and snack foods, such as cake, cookies, candy, chips, and soft drinks. How do I count carbohydrates in foods? There are two ways to count carbohydrates in food. You can read food labels or learn standard serving sizes of foods. You can use either of these methods or a combination of both. Using the Nutrition Facts label The Nutrition Facts list is included on the labels of almost all packaged foods and beverages in the [place]. It includes: The serving size. Information about nutrients in each serving, including the grams of carbohydrate per serving. To use the Nutrition Facts, decide how many servings you will have. Then,  multiply the number of servings by the number of carbohydrates per serving. The resulting number is the total grams of carbohydrates that you will be having. Learning the standard serving sizes of foods When you eat carbohydrate foods that are not packaged or do not include Nutrition Facts on the label, you need to measure the servings in order to count the grams of carbohydrates. Measure the foods that you will eat with a food scale or measuring cup, if needed. Decide how many standard-size servings you will eat. Multiply the number of servings by 15. For foods that contain carbohydrates, one serving equals 15 g of carbohydrates. For example, if you eat 2 cups or 10 oz (300 g) of strawberries, you will have eaten 2 servings and 30 g of carbohydrates (2 servings x 15 g = 30 g). For foods that have more than one food mixed, such as soups and casseroles, you must count the carbohydrates in each food that is included. The following list contains standard serving sizes of common carbohydrate-rich foods. Each of these servings has about 15 g of carbohydrates: 1 slice of bread. 1 six-inch (15 cm) tortilla. ? cup or 2 oz (53 g) cooked rice or pasta.  cup or 3 oz (85 g) cooked or canned, drained and rinsed beans or lentils.  cup or 3 oz (85 g) starchy vegetable, such as peas, corn, or squash.  cup or 4 oz (120 g) hot cereal.  cup or 3 oz (85 g) boiled or mashed potatoes, or  or 3 oz (85 g) of a large baked potato.  cup or 4 fl oz (118 mL) fruit  juice. 1 cup or 8 fl oz (237 mL) milk. 1 small or 4 oz (106 g) apple.  or 2 oz (63 g) of a medium banana. 1 cup or 5 oz (150 g) strawberries. 3 cups or 1 oz (28.3 g) popped popcorn. What is an example of carbohydrate counting? To calculate the grams of carbohydrates in this sample meal, follow the steps shown below. Sample meal 3 oz (85 g) chicken breast. ? cup or 4 oz (106 g) brown rice.  cup or 3 oz (85 g) corn. 1 cup or 8 fl oz (237 mL) milk. 1  cup or 5 oz (150 g) strawberries with sugar-free whipped topping. Carbohydrate calculation Identify the foods that contain carbohydrates: Rice. Corn. Milk. Strawberries. Calculate how many servings you have of each food: 2 servings rice. 1 serving corn. 1 serving milk. 1 serving strawberries. Multiply each number of servings by 15 g: 2 servings rice x 15 g = 30 g. 1 serving corn x 15 g = 15 g. 1 serving milk x 15 g = 15 g. 1 serving strawberries x 15 g = 15 g. Add together all of the amounts to find the total grams of carbohydrates eaten: 30 g + 15 g + 15 g + 15 g = 75 g of carbohydrates total. What are tips for following this plan? Shopping Develop a meal plan and then make a shopping list. Buy fresh and frozen vegetables, fresh and frozen fruit, dairy, eggs, beans, lentils, and whole grains. Look at food labels. Choose foods that have more fiber and less sugar. Avoid processed foods and foods with added sugars. Meal planning Aim to have the same number of grams of carbohydrates at each meal and for each snack time. Plan to have regular, balanced meals and snacks. Where to find more information American Diabetes Association: diabetes.org Centers for Disease Control and Prevention: TonerPromos.no Academy of Nutrition and Dietetics: eatright.org Association of Diabetes Care & Education Specialists: diabeteseducator.org Summary Carbohydrate counting is a method of keeping track of how many carbohydrates you eat. Eating carbohydrates increases the amount of sugar (glucose) in your blood. Counting how many carbohydrates you eat improves how well you manage your blood glucose. This helps you manage your diabetes. A dietitian can help you make a meal plan and calculate how many carbohydrates you should have at each meal and snack. This information is not intended to replace advice given to you by your health care provider. Make sure you discuss any questions you have with your health care  provider. Document Revised: 04/05/2020 Document Reviewed: 04/05/2020 Elsevier Patient Education  2023 [employer].

## 2022-12-31 ENCOUNTER — Encounter: Payer: Self-pay | Admitting: Nurse Practitioner

## 2022-12-31 ENCOUNTER — Ambulatory Visit (INDEPENDENT_AMBULATORY_CARE_PROVIDER_SITE_OTHER): Payer: Medicare Other | Admitting: Nurse Practitioner

## 2022-12-31 VITALS — BP 88/57 | HR 84 | Temp 97.9°F | Resp 20 | Ht 65.0 in | Wt 159.0 lb

## 2022-12-31 DIAGNOSIS — Z7984 Long term (current) use of oral hypoglycemic drugs: Secondary | ICD-10-CM | POA: Diagnosis not present

## 2022-12-31 DIAGNOSIS — M8588 Other specified disorders of bone density and structure, other site: Secondary | ICD-10-CM

## 2022-12-31 DIAGNOSIS — F17200 Nicotine dependence, unspecified, uncomplicated: Secondary | ICD-10-CM

## 2022-12-31 DIAGNOSIS — E119 Type 2 diabetes mellitus without complications: Secondary | ICD-10-CM

## 2022-12-31 DIAGNOSIS — E782 Mixed hyperlipidemia: Secondary | ICD-10-CM

## 2022-12-31 DIAGNOSIS — F3342 Major depressive disorder, recurrent, in full remission: Secondary | ICD-10-CM

## 2022-12-31 DIAGNOSIS — I10 Essential (primary) hypertension: Secondary | ICD-10-CM

## 2022-12-31 DIAGNOSIS — Z6829 Body mass index (BMI) 29.0-29.9, adult: Secondary | ICD-10-CM

## 2022-12-31 DIAGNOSIS — I6930 Unspecified sequelae of cerebral infarction: Secondary | ICD-10-CM | POA: Diagnosis not present

## 2022-12-31 LAB — BAYER DCA HB A1C WAIVED: HB A1C (BAYER DCA - WAIVED): 8 % — ABNORMAL HIGH (ref 4.8–5.6)

## 2022-12-31 MED ORDER — METFORMIN HCL 1000 MG PO TABS: 1000.0000 mg | ORAL_TABLET | Freq: Two times a day (BID) | ORAL | 1 refills | Status: DC

## 2022-12-31 MED ORDER — LISINOPRIL 20 MG PO TABS: 20.0000 mg | ORAL_TABLET | Freq: Every day | ORAL | 3 refills | Status: DC

## 2022-12-31 NOTE — Progress Notes (Signed)
Subjective:    Patient ID: Lisa Ball, female    DOB: 06-07-1961, 62 y.o.   MRN: [id number]   Chief Complaint: Medical Management of Chronic Issues    HPI:  Lisa Ball is a 62 y.o. who identifies as a female who was assigned female at birth.   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 Primary hypertension No c/o chest pain, sob  or headache. Does no check blood pressure at home BP Readings from Last 3 Encounters:  12/31/22 (!) 88/57  11/27/22 116/67  10/01/22 115/76     2. Mixed hyperlipidemia Does not watch diet and does no dedicated exercise. Lab Results  Component Value Date   CHOL 113 07/04/2022   HDL 25 (L) 07/04/2022   LDLCALC 56 07/04/2022   TRIG 191 (H) 07/04/2022   CHOLHDL 4.5 (H) 07/04/2022   The ASCVD Risk score (Arnett DK, et al., 2019) failed to calculate for the following reasons:   The patient has a prior MI or stroke diagnosis   3. Type 2 diabetes mellitus without complication, without long-term current use of insulin She does not check her blood sugars at home. Lab Results  Component Value Date   HGBA1C 6.8 (H) 07/04/2022     4. Late effect of cerebrovascular accident (CVA) No permanent effects  5. Osteopenia of lumbar spine Last dexascan was done on 01/03/22 t score was -0.1 doe sno weight bearing exercise.  6. Recurrent major depressive disorder, in full remission Son passed away in [place] and she is still having a hard time dealing with loss.    12/31/2022   10:30 AM 11/27/2022    4:36 PM 10/01/2022    3:07 PM  Depression screen PHQ 2/9  Decreased Interest 0 0 0  Down, Depressed, Hopeless 0 0 0  PHQ - 2 Score 0 0 0  Altered sleeping 0 0 0  Tired, decreased energy 0 0 0  Change in appetite 0 0 0  Feeling bad or failure about yourself  0 0 0  Trouble concentrating 0 0 0  Moving slowly or  fidgety/restless 0 0 0  Suicidal thoughts 0 0 0  PHQ-9 Score 0 0 0  Difficult doing work/chores Not difficult at all Not difficult at all Not difficult at all     7. Smoker Smokes over a pack a day  8. BMI 29.0-29.9,adult Weight is down 7 lbs Wt Readings from Last 3 Encounters:  12/31/22 159 lb (72.1 kg)  11/27/22 166 lb (75.3 kg)  10/01/22 167 lb (75.8 kg)   BMI Readings from Last 3 Encounters:  12/31/22 26.46 kg/m  11/27/22 27.62 kg/m  10/01/22 27.79 kg/m      New complaints: None today  Allergies  Allergen Reactions   Methotrexate Derivatives Shortness Of Breath   Penicillins Other (See Comments)    Couldn't  move   Outpatient Encounter Medications as of 12/31/2022  Medication Sig   albuterol (VENTOLIN HFA) 108 (90 Base) MCG/ACT inhaler Inhale 2 puffs into the lungs every 6 (six) hours as needed for wheezing or shortness of breath.   aspirin EC 81 MG tablet Take 81 mg by mouth daily.   atorvastatin (LIPITOR) 40 MG tablet Take 1 tablet (40 mg total) by mouth daily.   escitalopram (LEXAPRO) 20 MG tablet Take 1 tablet (20 mg total) by mouth daily.  fenofibrate 160 MG tablet Take 1 tablet (160 mg total) by mouth daily.   fluticasone (FLONASE) 50 MCG/ACT nasal spray Place 2 sprays into both nostrils daily.   lisinopril-hydrochlorothiazide (ZESTORETIC) 20-12.5 MG tablet Take 1 tablet by mouth daily.   metFORMIN (GLUCOPHAGE) 500 MG tablet Take 1 tablet (500 mg total) by mouth daily.   Secukinumab (COSENTYX SENSOREADY PEN) 150 MG/ML SOAJ Inject the contents of 1 pen into the skin every 4 weeks   [DISCONTINUED] predniSONE (STERAPRED UNI-PAK 21 TAB) 10 MG (21) TBPK tablet Use as directed on back of pill pack   No facility-administered encounter medications on file as of 12/31/2022.    Past Surgical History:  Procedure Laterality Date   BREAST CYST EXCISION Left    many years ago   CHOLECYSTECTOMY     TUBAL LIGATION      Family History  Problem Relation Age of  Onset   Heart disease Mother    Cancer Father 28       lung cancer-died at age 20   Diabetes Sister    Seizures Maternal Grandmother    Heart disease Maternal Grandfather    Seizures Paternal Grandmother    Heart disease Paternal Grandfather    Breast cancer Paternal Aunt    Colon cancer Neg Hx    Esophageal cancer Neg Hx    Rectal cancer Neg Hx    Stomach cancer Neg Hx       Controlled substance contract: n/a     Review of Systems  Constitutional:  Negative for diaphoresis.  Eyes:  Negative for pain.  Respiratory:  Negative for shortness of breath.   Cardiovascular:  Negative for chest pain, palpitations and leg swelling.  Gastrointestinal:  Negative for abdominal pain.  Endocrine: Negative for polydipsia.  Skin:  Negative for rash.  Neurological:  Negative for dizziness, weakness and headaches.  Hematological:  Does not bruise/bleed easily.  All other systems reviewed and are negative.      Objective:   Physical Exam Vitals and nursing note reviewed.  Constitutional:      General: She is not in acute distress.    Appearance: Normal appearance. She is well-developed.  HENT:     Head: Normocephalic.     Right Ear: Tympanic membrane normal.     Left Ear: Tympanic membrane normal.     Nose: Nose normal.     Mouth/Throat:     Mouth: Mucous membranes are moist.  Eyes:     Pupils: Pupils are equal, round, and reactive to light.  Neck:     Vascular: No carotid bruit or JVD.  Cardiovascular:     Rate and Rhythm: Normal rate and regular rhythm.     Heart sounds: Normal heart sounds.  Pulmonary:     Effort: Pulmonary effort is normal. No respiratory distress.     Breath sounds: Normal breath sounds. No wheezing or rales.  Chest:     Chest wall: No tenderness.  Abdominal:     General: Bowel sounds are normal. There is no distension or abdominal bruit.     Palpations: Abdomen is soft. There is no hepatomegaly, splenomegaly, mass or pulsatile mass.     Tenderness:  There is no abdominal tenderness.  Musculoskeletal:        General: Normal range of motion.     Cervical back: Normal range of motion and neck supple.  Lymphadenopathy:     Cervical: No cervical adenopathy.  Skin:    General: Skin is warm and dry.  Neurological:     Mental Status: She is alert and oriented to person, place, and time.     Deep Tendon Reflexes: Reflexes are normal and symmetric.  Psychiatric:        Behavior: Behavior normal.        Thought Content: Thought content normal.        Judgment: Judgment normal.    BP (!) 88/57   Pulse 84   Temp 97.9 F (36.6 C) (Temporal)   Resp 20   Ht  (1.651 m)   Wt 159 lb (72.1 kg)   SpO2 93%   BMI 26.46 kg/m    HGBA1c 8.0%     Assessment & Plan:   Lisa Ball comes in today with chief complaint of Medical Management of Chronic Issues   Diagnosis and orders addressed:  1. Primary hypertension Changed lisinopril from 20/12.5 to just lisinopril  daly - CBC with Differential/Platelet - CMP14+EGFR - lisinopril (ZESTRIL) 20 MG tablet; Take 1 tablet (20 mg total) by mouth daily.  Dispense: 90 tablet; Refill: 3  2. Mixed hyperlipidemia Low fat diet encouraged - Lipid panel  3. Type 2 diabetes mellitus without complication, without long-term current use of insulin Increased metformin to  bid Stricter carb counting - Bayer DCA Hb A1c Waived - metFORMIN (GLUCOPHAGE) 1000 MG tablet; Take 1 tablet (1,000 mg total) by mouth 2 (two) times daily with a meal.  Dispense: 180 tablet; Refill: 1  4. Late effect of cerebrovascular accident (CVA) Report any symptoms  5. Osteopenia of lumbar spine Weight bearing exercises encouraged  6. Recurrent major depressive disorder, in full remission Stress management  7. Smoker Smoking cessation encouraged  8. BMI 29.0-29.9,adult Discussed diet and exercise for person with BMI >25 Will recheck weight in 3-6 months    Labs pending Health Maintenance  reviewed Diet and exercise encouraged  Follow up plan: 3 months   [name], FNP

## 2023-01-01 LAB — CBC WITH DIFFERENTIAL/PLATELET
Basophils Absolute: 0.1 10*3/uL (ref 0.0–0.2)
Basos: 0 %
EOS (ABSOLUTE): 0.2 10*3/uL (ref 0.0–0.4)
Eos: 1 %
Hematocrit: 44.8 % (ref 34.0–46.6)
Hemoglobin: 14.8 g/dL (ref 11.1–15.9)
Immature Grans (Abs): 0.1 10*3/uL (ref 0.0–0.1)
Immature Granulocytes: 1 %
Lymphocytes Absolute: 2 10*3/uL (ref 0.7–3.1)
Lymphs: 11 %
MCH: 29.8 pg (ref 26.6–33.0)
MCHC: 33 g/dL (ref 31.5–35.7)
MCV: 90 fL (ref 79–97)
Monocytes Absolute: 1.3 10*3/uL — ABNORMAL HIGH (ref 0.1–0.9)
Monocytes: 7 %
Neutrophils Absolute: 14.5 10*3/uL — ABNORMAL HIGH (ref 1.4–7.0)
Neutrophils: 80 %
Platelets: 372 10*3/uL (ref 150–450)
RBC: 4.97 x10E6/uL (ref 3.77–5.28)
RDW: 13.9 % (ref 11.7–15.4)
WBC: 18.2 10*3/uL — ABNORMAL HIGH (ref 3.4–10.8)

## 2023-01-01 LAB — CMP14+EGFR
ALT: 10 IU/L (ref 0–32)
AST: 10 IU/L (ref 0–40)
Albumin/Globulin Ratio: 1.4 (ref 1.2–2.2)
Albumin: 3.9 g/dL (ref 3.9–4.9)
Alkaline Phosphatase: 74 IU/L (ref 44–121)
BUN/Creatinine Ratio: 8 — ABNORMAL LOW (ref 12–28)
BUN: 6 mg/dL — ABNORMAL LOW (ref 8–27)
Bilirubin Total: 1 mg/dL (ref 0.0–1.2)
CO2: 21 mmol/L (ref 20–29)
Calcium: 10 mg/dL (ref 8.7–10.3)
Chloride: 93 mmol/L — ABNORMAL LOW (ref 96–106)
Creatinine, Ser: 0.77 mg/dL (ref 0.57–1.00)
Globulin, Total: 2.8 g/dL (ref 1.5–4.5)
Glucose: 160 mg/dL — ABNORMAL HIGH (ref 70–99)
Potassium: 3.1 mmol/L — ABNORMAL LOW (ref 3.5–5.2)
Sodium: 134 mmol/L (ref 134–144)
Total Protein: 6.7 g/dL (ref 6.0–8.5)
eGFR: 88 mL/min/{1.73_m2} (ref >59)

## 2023-01-01 LAB — LIPID PANEL
Chol/HDL Ratio: 3.8 ratio (ref 0.0–4.4)
Cholesterol, Total: 102 mg/dL (ref 100–199)
HDL: 27 mg/dL — ABNORMAL LOW (ref >39)
LDL Chol Calc (NIH): 47 mg/dL (ref 0–99)
Triglycerides: 163 mg/dL — ABNORMAL HIGH (ref 0–149)
VLDL Cholesterol Cal: 28 mg/dL (ref 5–40)

## 2023-01-02 ENCOUNTER — Other Ambulatory Visit: Payer: Self-pay | Admitting: Nurse Practitioner

## 2023-01-02 DIAGNOSIS — Z1231 Encounter for screening mammogram for malignant neoplasm of breast: Secondary | ICD-10-CM

## 2023-01-07 ENCOUNTER — Other Ambulatory Visit: Payer: Self-pay

## 2023-01-07 DIAGNOSIS — Z122 Encounter for screening for malignant neoplasm of respiratory organs: Secondary | ICD-10-CM

## 2023-01-07 DIAGNOSIS — Z87891 Personal history of nicotine dependence: Secondary | ICD-10-CM

## 2023-01-07 NOTE — Progress Notes (Signed)
LDCT order placed per protocol °

## 2023-01-08 ENCOUNTER — Ambulatory Visit
Admission: RE | Admit: 2023-01-08 | Discharge: 2023-01-08 | Disposition: A | Discharge status: Home / Self Care | Payer: Medicare Other | Source: Ambulatory Visit | Attending: Nurse Practitioner | Admitting: Nurse Practitioner

## 2023-01-08 DIAGNOSIS — Z1231 Encounter for screening mammogram for malignant neoplasm of breast: Secondary | ICD-10-CM | POA: Diagnosis not present

## 2023-01-22 DIAGNOSIS — Z79899 Other long term (current) drug therapy: Secondary | ICD-10-CM | POA: Diagnosis not present

## 2023-01-22 DIAGNOSIS — M059 Rheumatoid arthritis with rheumatoid factor, unspecified: Secondary | ICD-10-CM | POA: Diagnosis not present

## 2023-03-06 ENCOUNTER — Ambulatory Visit (HOSPITAL_COMMUNITY): Payer: Medicare Other

## 2023-04-07 ENCOUNTER — Other Ambulatory Visit: Payer: Self-pay | Admitting: Nurse Practitioner

## 2023-04-07 ENCOUNTER — Encounter: Payer: Self-pay | Admitting: Nurse Practitioner

## 2023-04-07 ENCOUNTER — Ambulatory Visit (INDEPENDENT_AMBULATORY_CARE_PROVIDER_SITE_OTHER): Payer: Medicare Other | Admitting: Nurse Practitioner

## 2023-04-07 VITALS — BP 135/81 | HR 70 | Temp 97.2°F | Resp 20 | Ht 65.0 in | Wt 155.0 lb

## 2023-04-07 DIAGNOSIS — I10 Essential (primary) hypertension: Secondary | ICD-10-CM

## 2023-04-07 DIAGNOSIS — E782 Mixed hyperlipidemia: Secondary | ICD-10-CM

## 2023-04-07 DIAGNOSIS — M8588 Other specified disorders of bone density and structure, other site: Secondary | ICD-10-CM | POA: Diagnosis not present

## 2023-04-07 DIAGNOSIS — F17200 Nicotine dependence, unspecified, uncomplicated: Secondary | ICD-10-CM

## 2023-04-07 DIAGNOSIS — Z6829 Body mass index (BMI) 29.0-29.9, adult: Secondary | ICD-10-CM

## 2023-04-07 DIAGNOSIS — Z7984 Long term (current) use of oral hypoglycemic drugs: Secondary | ICD-10-CM

## 2023-04-07 DIAGNOSIS — F17210 Nicotine dependence, cigarettes, uncomplicated: Secondary | ICD-10-CM | POA: Diagnosis not present

## 2023-04-07 DIAGNOSIS — E119 Type 2 diabetes mellitus without complications: Secondary | ICD-10-CM | POA: Diagnosis not present

## 2023-04-07 DIAGNOSIS — R300 Dysuria: Secondary | ICD-10-CM | POA: Diagnosis not present

## 2023-04-07 DIAGNOSIS — F3342 Major depressive disorder, recurrent, in full remission: Secondary | ICD-10-CM

## 2023-04-07 DIAGNOSIS — I6930 Unspecified sequelae of cerebral infarction: Secondary | ICD-10-CM | POA: Diagnosis not present

## 2023-04-07 LAB — BAYER DCA HB A1C WAIVED: HB A1C (BAYER DCA - WAIVED): 6.6 % — ABNORMAL HIGH (ref 4.8–5.6)

## 2023-04-07 MED ORDER — ATORVASTATIN CALCIUM 40 MG PO TABS
40.0000 mg | ORAL_TABLET | Freq: Every day | ORAL | 1 refills | Status: DC
Start: 2023-04-07 — End: 2023-10-10

## 2023-04-07 MED ORDER — FENOFIBRATE 160 MG PO TABS
160.0000 mg | ORAL_TABLET | Freq: Every day | ORAL | 1 refills | Status: DC
Start: 2023-04-07 — End: 2023-10-10

## 2023-04-07 MED ORDER — LISINOPRIL 20 MG PO TABS
20.0000 mg | ORAL_TABLET | Freq: Every day | ORAL | 1 refills | Status: DC
Start: 2023-04-07 — End: 2023-10-10

## 2023-04-07 MED ORDER — METFORMIN HCL 1000 MG PO TABS
1000.0000 mg | ORAL_TABLET | Freq: Two times a day (BID) | ORAL | 1 refills | Status: DC
Start: 2023-04-07 — End: 2023-10-10

## 2023-04-07 MED ORDER — ESCITALOPRAM OXALATE 20 MG PO TABS
20.0000 mg | ORAL_TABLET | Freq: Every day | ORAL | 1 refills | Status: DC
Start: 2023-04-07 — End: 2023-10-10

## 2023-04-07 NOTE — Patient Instructions (Signed)

## 2023-04-07 NOTE — Addendum Note (Signed)
Addended by: Cleda Daub on: 04/07/2023 04:05 PM   Modules accepted: Orders

## 2023-04-07 NOTE — Progress Notes (Signed)
Subjective:    Patient ID: Lisa Ball, female    DOB: 17-Apr-1961, 62 y.o.   MRN: [id number]   Chief Complaint: medical management of chronic issues     HPI:  Lisa Ball is a 62 y.o. who identifies as a female who was assigned female at birth.   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   Comes in today for follow up of the following chronic medical issues:  1. Primary hypertension No c/o chest pain, sob or headache. Doe snot check blood pressure at home. BP Readings from Last 3 Encounters:  12/31/22 (!) 88/57  11/27/22 116/67  10/01/22 115/76     2. Late effect of cerebrovascular accident (CVA) Has no permanent effects  3. Mixed hyperlipidemia Doe snot really watch  diet and does no dedicated exercise Lab Results  Component Value Date   CHOL 102 12/31/2022   HDL 27 (L) 12/31/2022   LDLCALC 47 12/31/2022   TRIG 163 (H) 12/31/2022   CHOLHDL 3.8 12/31/2022     4. Osteopenia of lumbar spine No weight bearing exercises. Last dexascan was done on 01/03/22. T score was -0.1 which was normal.  5. Recurrent major depressive disorder, in full remission ([name]) Has been on lexapro for awhile. Says she is doing well.    04/07/2023   11:45 AM 12/31/2022   10:30 AM 11/27/2022    4:36 PM  Depression screen PHQ 2/9  Decreased Interest 0 0 0  Down, Depressed, Hopeless 0 0 0  PHQ - 2 Score 0 0 0  Altered sleeping 0 0 0  Tired, decreased energy 0 0 0  Change in appetite 0 0 0  Feeling bad or failure about yourself  0 0 0  Trouble concentrating 0 0 0  Moving slowly or fidgety/restless 0 0 0  Suicidal thoughts 0 0 0  PHQ-9 Score 0 0 0  Difficult doing work/chores Not difficult at all Not difficult at all Not difficult at all      04/07/2023   11:45 AM 12/31/2022   10:30 AM 11/27/2022    4:46 PM 10/01/2022    3:07 PM  GAD 7 : Generalized Anxiety Score  Nervous, Anxious, on Edge 0 0 0 0   Control/stop worrying 0 0 0 0  Worry too much - different things 0 0 0 0  Trouble relaxing 0 0 0 0  Restless 0 0 0 0  Easily annoyed or irritable 0 0 0 0  Afraid - awful might happen 0 0 0 0  Total GAD 7 Score 0 0 0 0  Anxiety Difficulty Not difficult at all Not difficult at all Not difficult at all Not difficult at all      6. Smoker Smokes over a pack a day. Has low dose CT scan scheduled for 04/22/23.  7. Diabetes Fasting blood sugars are around 130-140 . She does not check her blood sugars very often.  8. BMI 29.0-29.9,adult Weight is down 4lbs  Wt Readings from Last 3 Encounters:  04/07/23 155 lb (70.3 kg)  12/31/22 159 lb (72.1 kg)  11/27/22 166 lb (75.3 kg)   BMI Readings from Last 3 Encounters:  04/07/23 25.79 kg/m  12/31/22 26.46 kg/m  11/27/22 27.62 kg/m      New complaints: None today  Allergies  Allergen Reactions   Methotrexate Derivatives Shortness Of Breath   Penicillins Other (See Comments)    Couldn't  move   Outpatient Encounter Medications as of 04/07/2023  Medication Sig   albuterol (VENTOLIN HFA) 108 (90 Base) MCG/ACT inhaler Inhale 2 puffs into the lungs every 6 (six) hours as needed for wheezing or shortness of breath.   aspirin EC 81 MG tablet Take 81 mg by mouth daily.   atorvastatin (LIPITOR) 40 MG tablet Take 1 tablet (40 mg total) by mouth daily.   escitalopram (LEXAPRO) 20 MG tablet Take 1 tablet (20 mg total) by mouth daily.   fenofibrate 160 MG tablet Take 1 tablet (160 mg total) by mouth daily.   fluticasone (FLONASE) 50 MCG/ACT nasal spray Place 2 sprays into both nostrils daily.   lisinopril (ZESTRIL) 20 MG tablet Take 1 tablet (20 mg total) by mouth daily.   metFORMIN (GLUCOPHAGE) 1000 MG tablet Take 1 tablet (1,000 mg total) by mouth 2 (two) times daily with a meal.   Secukinumab (COSENTYX SENSOREADY PEN) 150 MG/ML SOAJ Inject the contents of 1 pen into the skin every 4 weeks   No facility-administered encounter medications  on file as of 04/07/2023.    Past Surgical History:  Procedure Laterality Date   BREAST CYST EXCISION Left    many years ago   CHOLECYSTECTOMY     TUBAL LIGATION      Family History  Problem Relation Age of Onset   Heart disease Mother    Cancer Father 5       lung cancer-died at age 21   Diabetes Sister    Seizures Maternal Grandmother    Heart disease Maternal Grandfather    Seizures Paternal Grandmother    Heart disease Paternal Grandfather    Breast cancer Paternal Aunt    Colon cancer Neg Hx    Esophageal cancer Neg Hx    Rectal cancer Neg Hx    Stomach cancer Neg Hx       Controlled substance contract: n/a     Review of Systems  Constitutional:  Negative for diaphoresis.  Eyes:  Negative for pain.  Respiratory:  Negative for shortness of breath.   Cardiovascular:  Negative for chest pain, palpitations and leg swelling.  Gastrointestinal:  Negative for abdominal pain.  Endocrine: Negative for polydipsia.  Skin:  Negative for rash.  Neurological:  Negative for dizziness, weakness and headaches.  Hematological:  Does not bruise/bleed easily.  All other systems reviewed and are negative.      Objective:   Physical Exam Vitals and nursing note reviewed.  Constitutional:      General: She is not in acute distress.    Appearance: Normal appearance. She is well-developed.  HENT:     Head: Normocephalic.     Right Ear: Tympanic membrane normal.     Left Ear: Tympanic membrane normal.     Nose: Nose normal.     Mouth/Throat:     Mouth: Mucous membranes are moist.  Eyes:     Pupils: Pupils are equal, round, and reactive to light.  Neck:     Vascular: No carotid bruit or JVD.  Cardiovascular:     Rate and Rhythm: Normal rate and regular rhythm.     Heart sounds: Normal heart sounds.  Pulmonary:     Effort: Pulmonary effort is normal. No respiratory distress.     Breath sounds: Normal breath sounds. No wheezing or rales.  Chest:     Chest wall: No  tenderness.  Abdominal:     General: Bowel sounds are normal. There is no distension or abdominal bruit.  Palpations: Abdomen is soft. There is no hepatomegaly, splenomegaly, mass or pulsatile mass.     Tenderness: There is no abdominal tenderness.  Musculoskeletal:        General: Normal range of motion.     Cervical back: Normal range of motion and neck supple.  Lymphadenopathy:     Cervical: No cervical adenopathy.  Skin:    General: Skin is warm and dry.  Neurological:     Mental Status: She is alert and oriented to person, place, and time.     Deep Tendon Reflexes: Reflexes are normal and symmetric.  Psychiatric:        Behavior: Behavior normal.        Thought Content: Thought content normal.        Judgment: Judgment normal.    BP 135/81   Pulse 70   Temp (!) 97.2 F (36.2 C) (Temporal)   Resp 20   Ht 5\' 5"  (1.651 m)   Wt 155 lb (70.3 kg)   SpO2 96%   BMI 25.79 kg/m   Hgba1c 6.6      Assessment & Plan:   Lisa Ball comes in today with chief complaint of Medical Management of Chronic Issues   Diagnosis and orders addressed:  1. Primary hypertension Low sodium diet - CBC with Differential/Platelet - CMP14+EGFR - lisinopril (ZESTRIL) 20 MG tablet; Take 1 tablet (20 mg total) by mouth daily.  Dispense: 90 tablet; Refill: 1  2. Late effect of cerebrovascular accident (CVA)  3. Mixed hyperlipidemia Low fat diet - Lipid panel - atorvastatin (LIPITOR) 40 MG tablet; Take 1 tablet (40 mg total) by mouth daily.  Dispense: 90 tablet; Refill: 1 - fenofibrate 160 MG tablet; Take 1 tablet (160 mg total) by mouth daily.  Dispense: 90 tablet; Refill: 1  4. Osteopenia of lumbar spine Weight bearing exercises  5. Recurrent major depressive disorder, in full remission ([name]) Stress management - escitalopram (LEXAPRO) 20 MG tablet; Take 1 tablet (20 mg total) by mouth daily.  Dispense: 90 tablet; Refill: 1  6. Diabetes mellitus treated with oral medication  ([name]) Continue to watch carbs in diet - Bayer DCA Hb A1c Waived - metFORMIN (GLUCOPHAGE) 1000 MG tablet; Take 1 tablet (1,000 mg total) by mouth 2 (two) times daily with a meal.  Dispense: 180 tablet; Refill: 1  7. Smoker Smoking cessation encourage   8. BMI 29.0-29.9,adult Discussed diet and exercise for person with BMI >25 Will recheck weight in 3-6 months    Labs pending Health Maintenance reviewed Diet and exercise encouraged  Follow up plan: 6 months   [name], FNP

## 2023-04-08 LAB — CBC WITH DIFFERENTIAL/PLATELET
Basophils Absolute: 0.1 10*3/uL (ref 0.0–0.2)
Basos: 1 %
EOS (ABSOLUTE): 0.2 10*3/uL (ref 0.0–0.4)
Eos: 2 %
Hematocrit: 44 % (ref 34.0–46.6)
Hemoglobin: 14.6 g/dL (ref 11.1–15.9)
Immature Grans (Abs): 0.1 10*3/uL (ref 0.0–0.1)
Immature Granulocytes: 1 %
Lymphocytes Absolute: 2.6 10*3/uL (ref 0.7–3.1)
Lymphs: 24 %
MCH: 29.9 pg (ref 26.6–33.0)
MCHC: 33.2 g/dL (ref 31.5–35.7)
MCV: 90 fL (ref 79–97)
Monocytes Absolute: 0.9 10*3/uL (ref 0.1–0.9)
Monocytes: 8 %
Neutrophils Absolute: 7.2 10*3/uL — ABNORMAL HIGH (ref 1.4–7.0)
Neutrophils: 64 %
Platelets: 456 10*3/uL — ABNORMAL HIGH (ref 150–450)
RBC: 4.88 x10E6/uL (ref 3.77–5.28)
RDW: 12.7 % (ref 11.7–15.4)
WBC: 11 10*3/uL — ABNORMAL HIGH (ref 3.4–10.8)

## 2023-04-08 LAB — LIPID PANEL
Chol/HDL Ratio: 3.9 ratio (ref 0.0–4.4)
Cholesterol, Total: 108 mg/dL (ref 100–199)
HDL: 28 mg/dL — ABNORMAL LOW (ref >39)
LDL Chol Calc (NIH): 54 mg/dL (ref 0–99)
Triglycerides: 146 mg/dL (ref 0–149)
VLDL Cholesterol Cal: 26 mg/dL (ref 5–40)

## 2023-04-08 LAB — URINALYSIS, COMPLETE
Bilirubin, UA: NEGATIVE
Glucose, UA: NEGATIVE
Ketones, UA: NEGATIVE
Leukocytes,UA: NEGATIVE
Nitrite, UA: NEGATIVE
Protein,UA: NEGATIVE
RBC, UA: NEGATIVE
Specific Gravity, UA: 1.02 (ref 1.005–1.030)
Urobilinogen, Ur: 4 mg/dL — ABNORMAL HIGH (ref 0.2–1.0)
pH, UA: 6 (ref 5.0–7.5)

## 2023-04-08 LAB — CMP14+EGFR
ALT: 8 IU/L (ref 0–32)
AST: 10 IU/L (ref 0–40)
Albumin: 4 g/dL (ref 3.9–4.9)
Alkaline Phosphatase: 54 IU/L (ref 44–121)
BUN/Creatinine Ratio: 16 (ref 12–28)
BUN: 11 mg/dL (ref 8–27)
Bilirubin Total: 0.3 mg/dL (ref 0.0–1.2)
CO2: 27 mmol/L (ref 20–29)
Calcium: 9.9 mg/dL (ref 8.7–10.3)
Chloride: 95 mmol/L — ABNORMAL LOW (ref 96–106)
Creatinine, Ser: 0.7 mg/dL (ref 0.57–1.00)
Globulin, Total: 2.6 g/dL (ref 1.5–4.5)
Glucose: 90 mg/dL (ref 70–99)
Potassium: 3.7 mmol/L (ref 3.5–5.2)
Sodium: 134 mmol/L (ref 134–144)
Total Protein: 6.6 g/dL (ref 6.0–8.5)
eGFR: 98 mL/min/{1.73_m2} (ref >59)

## 2023-04-09 LAB — URINE CULTURE

## 2023-04-11 MED ORDER — CEPHALEXIN 500 MG PO CAPS: 500.0000 mg | ORAL_CAPSULE | Freq: Two times a day (BID) | ORAL | 0 refills | Status: DC

## 2023-04-11 NOTE — Addendum Note (Signed)
Addended by: [name] on: 04/11/2023 05:00 PM   Modules accepted: Orders

## 2023-04-22 ENCOUNTER — Ambulatory Visit (HOSPITAL_COMMUNITY)
Admission: RE | Admit: 2023-04-22 | Discharge: 2023-04-22 | Disposition: A | Discharge status: Home / Self Care | Payer: Medicare Other | Source: Ambulatory Visit | Attending: Physician Assistant | Admitting: Physician Assistant

## 2023-04-22 DIAGNOSIS — Z122 Encounter for screening for malignant neoplasm of respiratory organs: Secondary | ICD-10-CM | POA: Diagnosis not present

## 2023-04-22 DIAGNOSIS — Z87891 Personal history of nicotine dependence: Secondary | ICD-10-CM | POA: Diagnosis not present

## 2023-04-22 DIAGNOSIS — F17210 Nicotine dependence, cigarettes, uncomplicated: Secondary | ICD-10-CM | POA: Diagnosis not present

## 2023-05-02 ENCOUNTER — Ambulatory Visit (INDEPENDENT_AMBULATORY_CARE_PROVIDER_SITE_OTHER): Payer: Medicare Other

## 2023-05-02 VITALS — Ht 65.0 in | Wt 155.0 lb

## 2023-05-02 DIAGNOSIS — Z1211 Encounter for screening for malignant neoplasm of colon: Secondary | ICD-10-CM

## 2023-05-02 DIAGNOSIS — Z0000 Encounter for general adult medical examination without abnormal findings: Secondary | ICD-10-CM

## 2023-05-02 NOTE — Patient Instructions (Signed)
Lisa Ball , Thank you for taking time to come for your Medicare Wellness Visit. I appreciate your ongoing commitment to your health goals. Please review the following plan we discussed and let me know if I can assist you in the future.   Referrals/Orders/Follow-Ups/Clinician Recommendations: Aim for 30 minutes of exercise or brisk walking, 6-8 glasses of water, and 5 servings of fruits and vegetables each day.   This is a list of the screening recommended for you and due dates:  Health Maintenance  Topic Date Due   COVID-19 Vaccine (1) Never done   Eye exam for diabetics  Never done   HIV Screening  Never done   Zoster (Shingles) Vaccine (1 of 2) Never done   Colon Cancer Screening  02/05/2022   Flu Shot  04/17/2023   Yearly kidney health urinalysis for diabetes  07/05/2023   Hemoglobin A1C  10/08/2023   Complete foot exam   12/31/2023   DEXA scan (bone density measurement)  01/04/2024   Pap Smear  03/15/2024   Yearly kidney function blood test for diabetes  04/06/2024   Screening for Lung Cancer  04/21/2024   Medicare Annual Wellness Visit  05/01/2024   Mammogram  01/07/2025   DTaP/Tdap/Td vaccine (3 - Td or Tdap) 03/16/2031   Hepatitis C Screening  Completed   HPV Vaccine  Aged Out    Advanced directives: (Provided) Advance directive discussed with you today. I have provided a copy for you to complete at home and have notarized. Once this is complete, please bring a copy in to our office so we can scan it into your chart. Information on Advanced Care Planning can be found at [facility] Advance Health Care Directives Advance Health Care Directives ([email or web address]    Next Medicare Annual Wellness Visit scheduled for next year: Yes  Preventive Care 40-64 Years, Female Preventive care refers to lifestyle choices and visits with your health care provider that can promote health and wellness. What does preventive care include? A yearly physical exam. This is also  called an annual well check. Dental exams once or twice a year. Routine eye exams. Ask your health care provider how often you should have your eyes checked. Personal lifestyle choices, including: Daily care of your teeth and gums. Regular physical activity. Eating a healthy diet. Avoiding tobacco and drug use. Limiting alcohol use. Practicing safe sex. Taking low-dose aspirin daily starting at age 58. Taking vitamin and mineral supplements as recommended by your health care provider. What happens during an annual well check? The services and screenings done by your health care provider during your annual well check will depend on your age, overall health, lifestyle risk factors, and family history of disease. Counseling  Your health care provider may ask you questions about your: Alcohol use. Tobacco use. Drug use. Emotional well-being. Home and relationship well-being. Sexual activity. Eating habits. Work and work Astron[employer]. Method of birth control. Menstrual cycle. Pregnancy history. Screening  You may have the following tests or measurements: Height, weight, and BMI. Blood pressure. Lipid and cholesterol levels. These may be checked every 5 years, or more frequently if you are over 50 years old. Skin check. Lung cancer screening. You may have this screening every year starting at age 56 if you have a 30-pack-year history of smoking and currently smoke or have quit within the past 15 years. Fecal occult blood test (FOBT) of the stool. You may have this test every year starting at age 43. Flexible sigmoidoscopy or  colonoscopy. You may have a sigmoidoscopy every 5 years or a colonoscopy every 10 years starting at age 2. Hepatitis C blood test. Hepatitis B blood test. Sexually transmitted disease (STD) testing. Diabetes screening. This is done by checking your blood sugar (glucose) after you have not eaten for a while (fasting). You may have this done every 1-3  years. Mammogram. This may be done every 1-2 years. Talk to your health care provider about when you should start having regular mammograms. This may depend on whether you have a family history of breast cancer. BRCA-related cancer screening. This may be done if you have a family history of breast, ovarian, tubal, or peritoneal cancers. Pelvic exam and Pap test. This may be done every 3 years starting at age 47. Starting at age 51, this may be done every 5 years if you have a Pap test in combination with an HPV test. Bone density scan. This is done to screen for osteoporosis. You may have this scan if you are at high risk for osteoporosis. Discuss your test results, treatment options, and if necessary, the need for more tests with your health care provider. Vaccines  Your health care provider may recommend certain vaccines, such as: Influenza vaccine. This is recommended every year. Tetanus, diphtheria, and acellular pertussis (Tdap, Td) vaccine. You may need a Td booster every 10 years. Zoster vaccine. You may need this after age 33. Pneumococcal 13-valent conjugate (PCV13) vaccine. You may need this if you have certain conditions and were not previously vaccinated. Pneumococcal polysaccharide (PPSV23) vaccine. You may need one or two doses if you smoke cigarettes or if you have certain conditions. Talk to your health care provider about which screenings and vaccines you need and how often you need them. This information is not intended to replace advice given to you by your health care provider. Make sure you discuss any questions you have with your health care provider. Document Released: 09/29/2015 Document Revised: 05/22/2016 Document Reviewed: 07/04/2015 Elsevier Interactive Patient Education  2017 [employer].    Fall Prevention in the Home Falls can cause injuries. They can happen to people of all ages. There are many things you can do to make your home safe and to help prevent  falls. What can I do on the outside of my home? Regularly fix the edges of walkways and driveways and fix any cracks. Remove anything that might make you trip as you walk through a door, such as a raised step or threshold. Trim any bushes or trees on the path to your home. Use bright outdoor lighting. Clear any walking paths of anything that might make someone trip, such as rocks or tools. Regularly check to see if handrails are loose or broken. Make sure that both sides of any steps have handrails. Any raised decks and porches should have guardrails on the edges. Have any leaves, snow, or ice cleared regularly. Use sand or salt on walking paths during winter. Clean up any spills in your garage right away. This includes oil or grease spills. What can I do in the bathroom? Use night lights. Install grab bars by the toilet and in the tub and shower. Do not use towel bars as grab bars. Use non-skid mats or decals in the tub or shower. If you need to sit down in the shower, use a plastic, non-slip stool. Keep the floor dry. Clean up any water that spills on the floor as soon as it happens. Remove soap buildup in the tub or  shower regularly. Attach bath mats securely with double-sided non-slip rug tape. Do not have throw rugs and other things on the floor that can make you trip. What can I do in the bedroom? Use night lights. Make sure that you have a light by your bed that is easy to reach. Do not use any sheets or blankets that are too big for your bed. They should not hang down onto the floor. Have a firm chair that has side arms. You can use this for support while you get dressed. Do not have throw rugs and other things on the floor that can make you trip. What can I do in the kitchen? Clean up any spills right away. Avoid walking on wet floors. Keep items that you use a lot in easy-to-reach places. If you need to reach something above you, use a strong step stool that has a grab  bar. Keep electrical cords out of the way. Do not use floor polish or wax that makes floors slippery. If you must use wax, use non-skid floor wax. Do not have throw rugs and other things on the floor that can make you trip. What can I do with my stairs? Do not leave any items on the stairs. Make sure that there are handrails on both sides of the stairs and use them. Fix handrails that are broken or loose. Make sure that handrails are as long as the stairways. Check any carpeting to make sure that it is firmly attached to the stairs. Fix any carpet that is loose or worn. Avoid having throw rugs at the top or bottom of the stairs. If you do have throw rugs, attach them to the floor with carpet tape. Make sure that you have a light switch at the top of the stairs and the bottom of the stairs. If you do not have them, ask someone to add them for you. What else can I do to help prevent falls? Wear shoes that: Do not have high heels. Have rubber bottoms. Are comfortable and fit you well. Are closed at the toe. Do not wear sandals. If you use a stepladder: Make sure that it is fully opened. Do not climb a closed stepladder. Make sure that both sides of the stepladder are locked into place. Ask someone to hold it for you, if possible. Clearly mark and make sure that you can see: Any grab bars or handrails. First and last steps. Where the edge of each step is. Use tools that help you move around (mobility aids) if they are needed. These include: Canes. Walkers. Scooters. Crutches. Turn on the lights when you go into a dark area. Replace any light bulbs as soon as they burn out. Set up your furniture so you have a clear path. Avoid moving your furniture around. If any of your floors are uneven, fix them. If there are any pets around you, be aware of where they are. Review your medicines with your doctor. Some medicines can make you feel dizzy. This can increase your chance of falling. Ask  your doctor what other things that you can do to help prevent falls. This information is not intended to replace advice given to you by your health care provider. Make sure you discuss any questions you have with your health care provider. Document Released: 06/29/2009 Document Revised: 02/08/2016 Document Reviewed: 10/07/2014 Elsevier Interactive Patient Education  2017 [employer].

## 2023-05-02 NOTE — Progress Notes (Signed)
Subjective:   Lisa Ball is a 62 y.o. female who presents for Medicare Annual (Subsequent) preventive examination.  Visit Complete: Virtual  I connected with  Lisa Ball on 05/02/23 by a audio enabled telemedicine application and verified that I am speaking with the correct person using two identifiers.  Patient Location: Home  Provider Location: Home Office  I discussed the limitations of evaluation and management by telemedicine. The patient expressed understanding and agreed to proceed.  Patient Medicare AWV questionnaire was completed by the patient on 05/02/2023; I have confirmed that all information answered by patient is correct and no changes since this date.  Review of Systems    Vital Signs: Unable to obtain new vitals due to this being a telehealth visit.  Cardiac Risk Factors include: advanced age (>103men, >65 women);diabetes mellitus;dyslipidemia;hypertension;smoking/ tobacco exposure;sedentary lifestyle Nutrition Risk Assessment:  Has the patient had any N/V/D within the last 2 months?  No  Does the patient have any non-healing wounds?  No  Has the patient had any unintentional weight loss or weight gain?  No   Diabetes:  Is the patient diabetic?  Yes  If diabetic, was a CBG obtained today?  No  Did the patient bring in their glucometer from home?  No  How often do you monitor your CBG's? Once month .   Financial Strains and Diabetes Management:  Are you having any financial strains with the device, your supplies or your medication? No .  Does the patient want to be seen by Chronic Care Management for management of their diabetes?  No  Would the patient like to be referred to a Nutritionist or for Diabetic Management?  No   Diabetic Exams:  Diabetic Eye Exam: Overdue for diabetic eye exam. Pt has been advised about the importance in completing this exam. Patient advised to call and schedule an eye exam. Diabetic Foot Exam: Overdue, Pt has been  advised about the importance in completing this exam. Pt is scheduled for diabetic foot exam on next office visit .     Objective:    Today's Vitals   05/02/23 0935  Weight: 155 lb (70.3 kg)  Height: 5\' 5"  (1.651 m)   Body mass index is 25.79 kg/m.     05/02/2023    9:39 AM 04/17/2022    1:08 PM 04/16/2021    4:11 PM 04/12/2020    9:32 AM 03/01/2019    9:49 AM  Advanced Directives  Does Patient Have a Medical Advance Directive? No No No No No  Would patient like information on creating a medical advance directive? Yes (MAU/Ambulatory/Procedural Areas - Information given) No - Patient declined Yes (MAU/Ambulatory/Procedural Areas - Information given) No - Patient declined No - Patient declined    Current Medications (verified) Outpatient Encounter Medications as of 05/02/2023  Medication Sig   albuterol (VENTOLIN HFA) 108 (90 Base) MCG/ACT inhaler Inhale 2 puffs into the lungs every 6 (six) hours as needed for wheezing or shortness of breath.   aspirin EC 81 MG tablet Take 81 mg by mouth daily.   atorvastatin (LIPITOR) 40 MG tablet Take 1 tablet (40 mg total) by mouth daily.   escitalopram (LEXAPRO) 20 MG tablet Take 1 tablet (20 mg total) by mouth daily.   fenofibrate 160 MG tablet Take 1 tablet (160 mg total) by mouth daily.   lisinopril (ZESTRIL) 20 MG tablet Take 1 tablet (20 mg total) by mouth daily.   metFORMIN (GLUCOPHAGE) 1000 MG tablet Take 1 tablet (1,000 mg  total) by mouth 2 (two) times daily with a meal.   predniSONE (DELTASONE) 10 MG tablet Take by mouth.   Secukinumab (COSENTYX SENSOREADY PEN) 150 MG/ML SOAJ Inject the contents of 1 pen into the skin every 4 weeks   cephALEXin (KEFLEX) 500 MG capsule Take 1 capsule (500 mg total) by mouth 2 (two) times daily. (Patient not taking: Reported on 05/02/2023)   No facility-administered encounter medications on file as of 05/02/2023.    Allergies (verified) Methotrexate derivatives and Penicillins   History: Past Medical  History:  Diagnosis Date   Anxiety    Colon polyps    Depression    Hyperlipidemia    Hypertension    Psoriatic arthritis ([name])    Pulmonary fibrosis ([name])    RA (rheumatoid arthritis) ([name])    Past Surgical History:  Procedure Laterality Date   BREAST CYST EXCISION Left    many years ago   CHOLECYSTECTOMY     TUBAL LIGATION     Family History  Problem Relation Age of Onset   Heart disease Mother    Cancer Father 41       lung cancer-died at age 58   Diabetes Sister    Seizures Maternal Grandmother    Heart disease Maternal Grandfather    Seizures Paternal Grandmother    Heart disease Paternal Grandfather    Breast cancer Paternal Aunt    Colon cancer Neg Hx    Esophageal cancer Neg Hx    Rectal cancer Neg Hx    Stomach cancer Neg Hx    Social History   Socioeconomic History   Marital status: Widowed    Spouse name: Not on file   Number of children: 1   Years of education: 9   Highest education level: 9th grade  Occupational History   Occupation: disability  Tobacco Use   Smoking status: Every Day    Current packs/day: 1.00    Average packs/day: 1 pack/day for 35.0 years (35.0 ttl pk-yrs)    Types: Cigarettes   Smokeless tobacco: Never  Vaping Use   Vaping status: Never Used  Substance and Sexual Activity   Alcohol use: No   Drug use: No   Sexual activity: Not Currently    Birth control/protection: Surgical  Other Topics Concern   Not on file  Social History Narrative   Not on file   Social Determinants of Health   Financial Resource Strain: Low Risk  (05/02/2023)   Overall Financial Resource Strain (CARDIA)    Difficulty of Paying Living Expenses: Not hard at all  Food Insecurity: No Food Insecurity (05/02/2023)   Hunger Vital Sign    Worried About Running Out of Food in the Last Year: Never true    Ran Out of Food in the Last Year: Never true  Transportation Needs: No Transportation Needs (05/02/2023)   PRAPARE - Scientist, research (physical sciences) (Medical): No    Lack of Transportation (Non-Medical): No  Physical Activity: Inactive (05/02/2023)   Exercise Vital Sign    Days of Exercise per Week: 0 days    Minutes of Exercise per Session: 0 min  Stress: No Stress Concern Present (05/02/2023)   [employer] of Occupational Health - Occupational Stress Questionnaire    Feeling of Stress : Not at all  Social Connections: Moderately Isolated (05/02/2023)   Social Connection and Isolation Panel [NHANES]    Frequency of Communication with Friends and Family: More than three times a week    Frequency  of Social Gatherings with Friends and Family: More than three times a week    Attends Religious Services: 1 to 4 times per year    Active Member of [employer] or Organizations: No    Attends Banker Meetings: Never    Marital Status: Widowed    Tobacco Counseling Ready to quit: No Counseling given: Not Answered   Clinical Intake:  Pre-visit preparation completed: Yes  Pain : No/denies pain     Nutritional Risks: None Diabetes: No  How often do you need to have someone help you when you read instructions, pamphlets, or other written materials from your doctor or pharmacy?: 1 - Never  Interpreter Needed?: No  Information entered by :: Renie Ora, LPN   Activities of Daily Living    05/02/2023    9:39 AM  In your present state of health, do you have any difficulty performing the following activities:  Hearing? 0  Vision? 0  Difficulty concentrating or making decisions? 0  Walking or climbing stairs? 0  Dressing or bathing? 0  Doing errands, shopping? 0  Preparing Food and eating ? N  Using the Toilet? N  In the past six months, have you accidently leaked urine? N  Do you have problems with loss of bowel control? N  Managing your Medications? N  Managing your Finances? N  Housekeeping or managing your Housekeeping? N    Patient Care Team: [name], FNP as PCP - General  (Nurse Practitioner)  Indicate any recent Medical Services you may have received from other than [facility] providers in the past year (date may be approximate).     Assessment:   This is a routine wellness examination for Sunjai.  Hearing/Vision screen Vision Screening - Comments:: Patient to call schedule with Dr.Johnson   Dietary issues and exercise activities discussed:     Goals Addressed             This Visit's Progress    DIET - EAT MORE FRUITS AND VEGETABLES   On track      Depression Screen    05/02/2023    9:38 AM 04/07/2023   11:45 AM 12/31/2022   10:30 AM 11/27/2022    4:36 PM 10/01/2022    3:07 PM 09/12/2022   10:46 AM 07/04/2022   10:00 AM  PHQ 2/9 Scores  PHQ - 2 Score 0 0 0 0 0 0 0  PHQ- 9 Score 0 0 0 0 0 1 0    Fall Risk    05/02/2023    9:36 AM 04/07/2023   11:45 AM 12/31/2022   10:29 AM 11/27/2022    4:36 PM 10/01/2022    3:06 PM  Fall Risk   Falls in the past year? 0 0 1 1 1   Number falls in past yr: 0  0 0 0  Injury with Fall? 0  1 1 1   Risk for fall due to : No Fall Risks  History of fall(s) History of fall(s) History of fall(s)  Follow up Falls prevention discussed  Education provided Falls evaluation completed Education provided    MEDICARE RISK AT HOME:  Medicare Risk at Home - 05/02/23 0936     Any stairs in or around the home? No    If so, are there any without handrails? No    Home free of loose throw rugs in walkways, pet beds, electrical cords, etc? Yes    Adequate lighting in your home to reduce risk of falls? Yes  Life alert? No    Use of a cane, walker or w/c? No    Grab bars in the bathroom? Yes    Shower chair or bench in shower? Yes    Elevated toilet seat or a handicapped toilet? Yes             TIMED UP AND GO:  Was the test performed?  No    Cognitive Function:        05/02/2023    9:39 AM 04/17/2022    1:10 PM 04/12/2020    9:34 AM 03/01/2019    9:52 AM  6CIT Screen  What Year? 0 points 0 points 0 points 0  points  What month? 0 points 0 points 0 points 0 points  What time? 0 points 0 points 0 points 0 points  Count back from 20 0 points 0 points 0 points 0 points  Months in reverse 0 points 0 points 0 points 0 points  Repeat phrase 0 points 0 points 0 points 2 points  Total Score 0 points 0 points 0 points 2 points    Immunizations Immunization History  Administered Date(s) Administered   Influenza,inj,Quad PF,6+ Mos 07/24/2011   Influenza-Unspecified 05/19/2014   PPD Test 09/23/2012, 03/30/2014, 03/27/2015, 03/28/2016, 03/25/2017, 10/06/2018   Pneumococcal Polysaccharide-23 10/16/2004   Tdap 01/31/2011, 03/15/2021    TDAP status: Up to date  Flu Vaccine status: Due, Education has been provided regarding the importance of this vaccine. Advised may receive this vaccine at local pharmacy or Health Dept. Aware to provide a copy of the vaccination record if obtained from local pharmacy or Health Dept. Verbalized acceptance and understanding.  Pneumococcal vaccine status: Due, Education has been provided regarding the importance of this vaccine. Advised may receive this vaccine at local pharmacy or Health Dept. Aware to provide a copy of the vaccination record if obtained from local pharmacy or Health Dept. Verbalized acceptance and understanding.  Covid-19 vaccine status: Completed vaccines  Qualifies for Shingles Vaccine? Yes   Zostavax completed No   Shingrix Completed?: No.    Education has been provided regarding the importance of this vaccine. Patient has been advised to call insurance company to determine out of pocket expense if they have not yet received this vaccine. Advised may also receive vaccine at local pharmacy or Health Dept. Verbalized acceptance and understanding.  Screening Tests Health Maintenance  Topic Date Due   COVID-19 Vaccine (1) Never done   OPHTHALMOLOGY EXAM  Never done   HIV Screening  Never done   Zoster Vaccines- Shingrix (1 of 2) Never done    Colonoscopy  02/05/2022   INFLUENZA VACCINE  04/17/2023   Diabetic kidney evaluation - Urine ACR  07/05/2023   HEMOGLOBIN A1C  10/08/2023   FOOT EXAM  12/31/2023   DEXA SCAN  01/04/2024   PAP SMEAR-Modifier  03/15/2024   Diabetic kidney evaluation - eGFR measurement  04/06/2024   Lung Cancer Screening  04/21/2024   Medicare Annual Wellness (AWV)  05/01/2024   MAMMOGRAM  01/07/2025   DTaP/Tdap/Td (3 - Td or Tdap) 03/16/2031   Hepatitis C Screening  Completed   HPV VACCINES  Aged Out    Health Maintenance  Health Maintenance Due  Topic Date Due   COVID-19 Vaccine (1) Never done   OPHTHALMOLOGY EXAM  Never done   HIV Screening  Never done   Zoster Vaccines- Shingrix (1 of 2) Never done   Colonoscopy  02/05/2022   INFLUENZA VACCINE  04/17/2023    Colorectal cancer  screening: Referral to GI placed 05/02/2023. Pt aware the office will call re: appt.  Mammogram status: Completed 01/08/2023. Repeat every year  Bone Density status: Ordered not of age . Pt provided with contact info and advised to call to schedule appt.  Lung Cancer Screening: (Low Dose CT Chest recommended if Age 66-80 years, 20 pack-year currently smoking OR have quit w/in 15years.) does not qualify.   Lung Cancer Screening Referral: n/a  Additional Screening:  Hepatitis C Screening: does not qualify; Completed 06/20/2016  Vision Screening: Recommended annual ophthalmology exams for early detection of glaucoma and other disorders of the eye. Is the patient up to date with their annual eye exam?  No  Who is the provider or what is the name of the office in which the patient attends annual eye exams? Dr. Laural Benes  If pt is not established with a provider, would they like to be referred to a provider to establish care? No .   Dental Screening: Recommended annual dental exams for proper oral hygiene   Community Resource Referral / Chronic Care Management: CRR required this visit?  No   CCM required this visit?   No     Plan:     I have personally reviewed and noted the following in the patient's chart:   Medical and social history Use of alcohol, tobacco or illicit drugs  Current medications and supplements including opioid prescriptions. Patient is not currently taking opioid prescriptions. Functional ability and status Nutritional status Physical activity Advanced directives List of other physicians Hospitalizations, surgeries, and ER visits in previous 12 months Vitals Screenings to include cognitive, depression, and falls Referrals and appointments  In addition, I have reviewed and discussed with patient certain preventive protocols, quality metrics, and best practice recommendations. A written personalized care plan for preventive services as well as general preventive health recommendations were provided to patient.     Lorrene Reid, LPN   [id number]   After Visit Summary: (MyChart) Due to this being a telephonic visit, the after visit summary with patients personalized plan was offered to patient via MyChart   Nurse Notes: Due Pneumonia Vaccine

## 2023-05-05 ENCOUNTER — Encounter: Payer: Self-pay | Admitting: *Deleted

## 2023-05-05 NOTE — Progress Notes (Signed)
Patient notified of LDCT Lung Cancer Screening Results via mail with the recommendation to follow-up in 12 months. Patient's referring provider has been sent a copy of results. Results are as follows:   IMPRESSION: 1. Lung-RADS 2S, benign appearance or behavior. Continue annual screening with low-dose chest CT without contrast in 12 months. 2. The "S" modifier above refers to potentially clinically significant non lung cancer related findings. Specifically, there is aortic atherosclerosis, in addition to left main and three-vessel coronary artery disease. Please note that although the presence of coronary artery calcium documents the presence of coronary artery disease, the severity of this disease and any potential stenosis cannot be assessed on this non-gated CT examination. Assessment for potential risk factor modification, dietary therapy or pharmacologic therapy may be warranted, if clinically indicated. 3. Mild diffuse bronchial wall thickening with mild centrilobular and paraseptal emphysema; imaging findings suggestive of underlying COPD.

## 2023-06-08 ENCOUNTER — Other Ambulatory Visit: Payer: Self-pay | Admitting: Nurse Practitioner

## 2023-06-08 DIAGNOSIS — I10 Essential (primary) hypertension: Secondary | ICD-10-CM

## 2023-06-13 ENCOUNTER — Other Ambulatory Visit: Payer: Self-pay | Admitting: Nurse Practitioner

## 2023-06-13 DIAGNOSIS — I10 Essential (primary) hypertension: Secondary | ICD-10-CM

## 2023-06-20 ENCOUNTER — Other Ambulatory Visit: Payer: Self-pay | Admitting: Nurse Practitioner

## 2023-06-20 DIAGNOSIS — Z1211 Encounter for screening for malignant neoplasm of colon: Secondary | ICD-10-CM

## 2023-06-20 DIAGNOSIS — Z1212 Encounter for screening for malignant neoplasm of rectum: Secondary | ICD-10-CM

## 2023-08-04 ENCOUNTER — Telehealth: Payer: Self-pay | Admitting: Nurse Practitioner

## 2023-08-20 ENCOUNTER — Ambulatory Visit (INDEPENDENT_AMBULATORY_CARE_PROVIDER_SITE_OTHER): Payer: Medicare Other | Admitting: Family Medicine

## 2023-08-20 DIAGNOSIS — E119 Type 2 diabetes mellitus without complications: Secondary | ICD-10-CM | POA: Diagnosis not present

## 2023-08-20 LAB — HM DIABETES EYE EXAM

## 2023-08-20 NOTE — Progress Notes (Addendum)
Lisa Ball arrived 08/20/2023 and has given verbal consent to obtain images and complete their overdue diabetic retinal screening.  The images have been sent to an ophthalmologist or optometrist for review and interpretation.  Results will be sent back to [name], FNP for review.  Patient has been informed they will be contacted when we receive the results via telephone or MyChart     [name], FNP   Agree [name], FNP

## 2023-09-19 IMAGING — CT CT CHEST LUNG CANCER SCREENING LOW DOSE W/O CM
1 of 2 series · 10 of 20 positions shown, 13 images · non-contrast
Comparison: None.

CLINICAL DATA: Current smoker with 30 pack-year history

EXAM:
CT CHEST WITHOUT CONTRAST LOW-DOSE FOR LUNG CANCER SCREENING
TECHNIQUE: Multidetector CT imaging of the chest was performed following the
standard protocol without IV contrast.

[ct lung segmentation data · axial · 0.68mm/px · z∈[+1179,+1179]mm · 10 of 263 frames shown]
[frame 1/263  mediastinal]
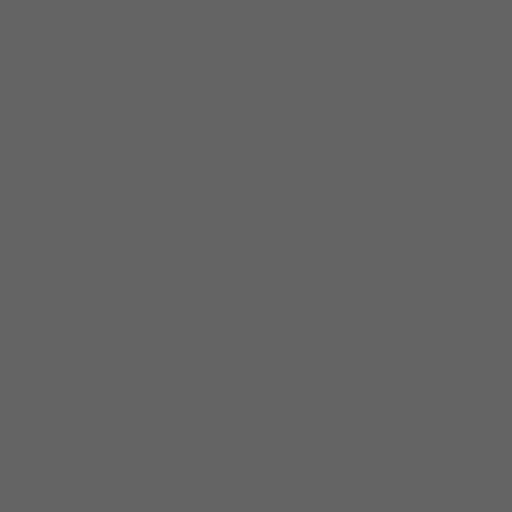
[frame 1/263  lung]
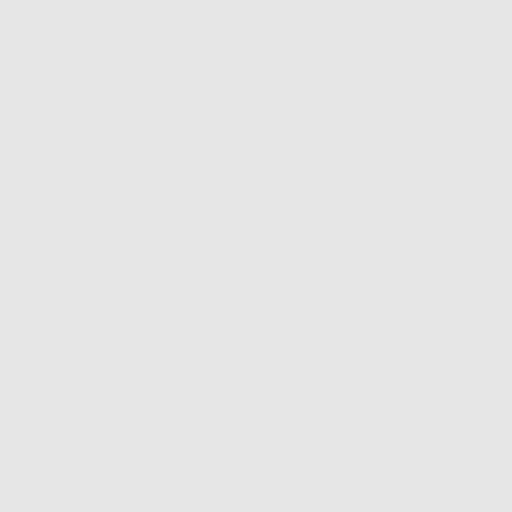
[frame 30/263  lung]
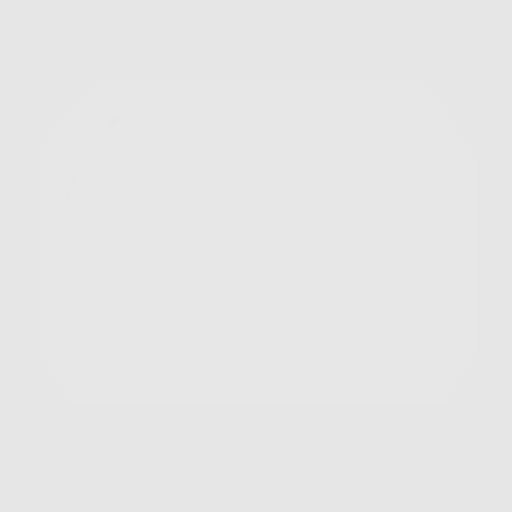
[frame 59/263  lung]
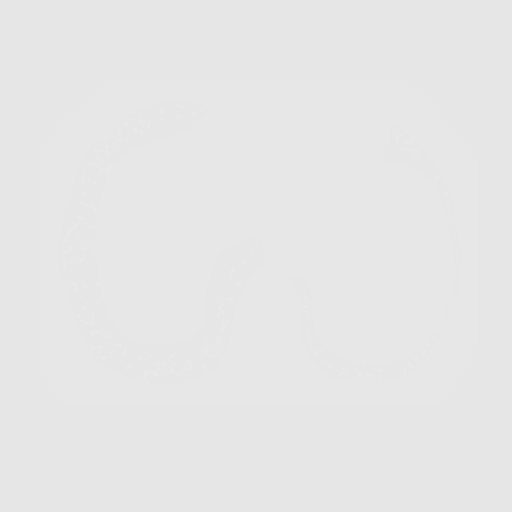
[frame 88/263  lung]
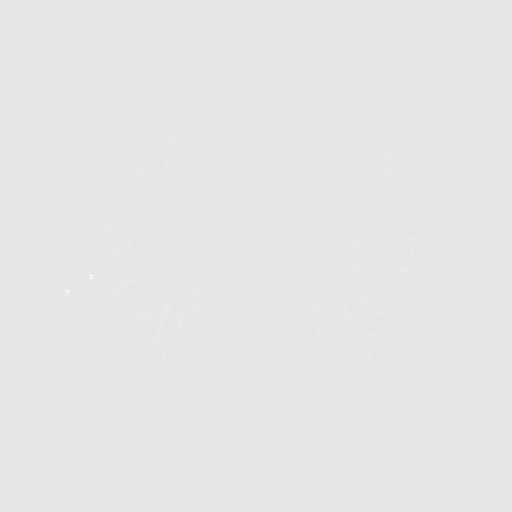
[frame 117/263  mediastinal]
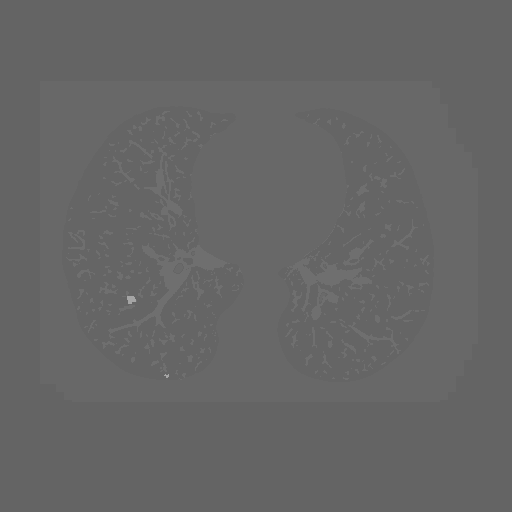
[frame 117/263  lung]
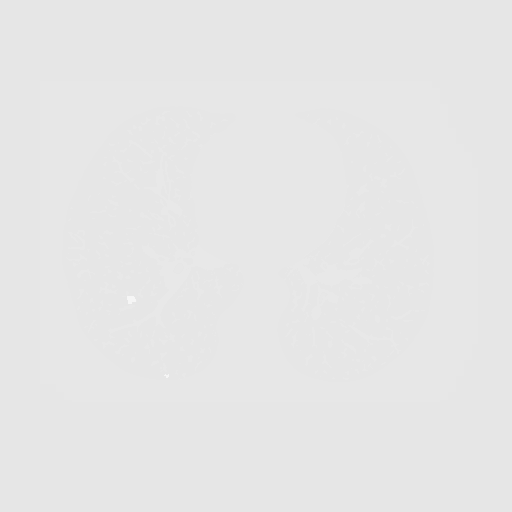
[frame 146/263  lung]
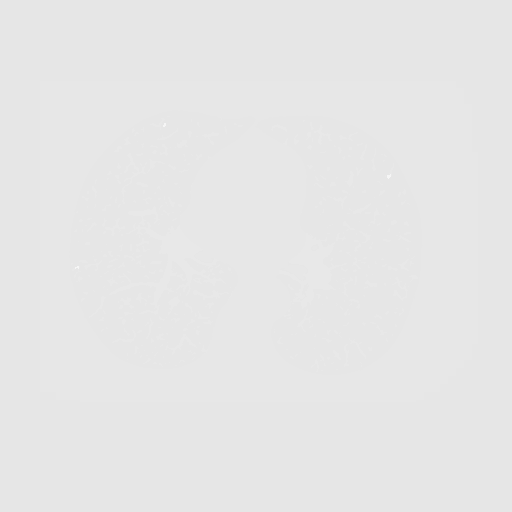
[frame 175/263  lung]
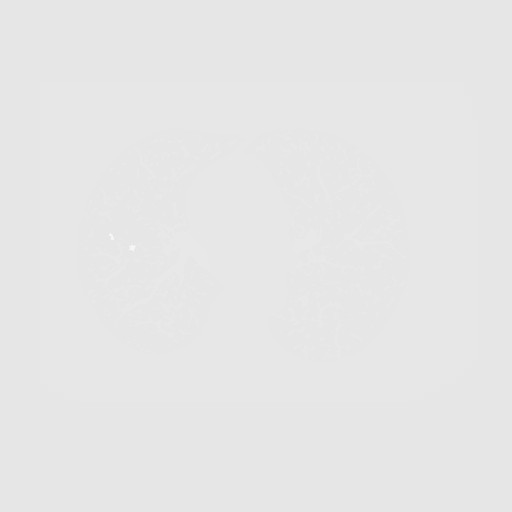
[frame 204/263  lung]
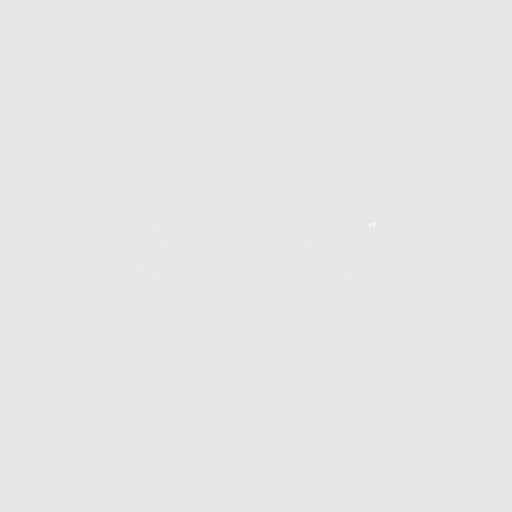
[frame 233/263  mediastinal]
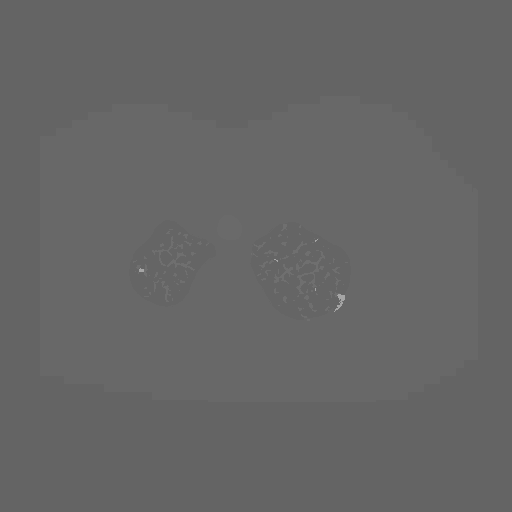
[frame 233/263  lung]
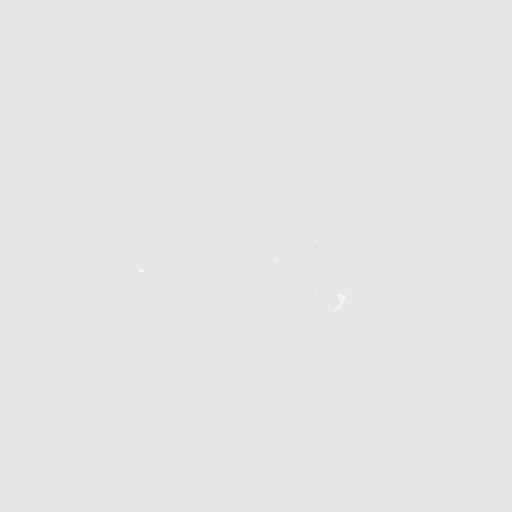
[frame 263/263  lung]
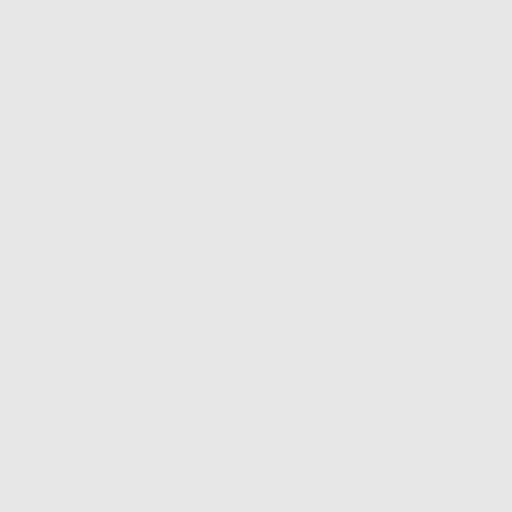

[10 of 20 positions shown; findings below may reference images not displayed]

FINDINGS: Cardiovascular: Normal heart size. No pericardial effusion.
Three-vessel coronary artery calcifications. Atherosclerotic disease
of the thoracic aorta.

Mediastinum/Nodes: No pathologically enlarged lymph nodes seen in
the chest. Normal esophagus.

Lungs/Pleura: Central airways are patent. Upper lobe predominant
centrilobular emphysema. Irregular cysts and numerous upper lobe
irregular solid nodules. Largest nodule is located in the right lung
apex and measures 7 mm in mean diameter.

Upper Abdomen: No acute abnormality.

Musculoskeletal: No chest wall mass or suspicious bone lesions
identified.
IMPRESSION: Innumerable irregular pulmonary nodules and cysts, most prominent in
the upper lungs superimposed on a background of centrilobular
emphysema. Largest nodule measures up to 7 mm. Findings are likely
due to smoking-related Langerhans cell histiocytosis. Lung-RADS 3,
probably benign findings. Short-term follow-up in 6 months is
recommended with repeat low-dose chest CT without contrast (please
use the following order, "CT CHEST LCS NODULE FOLLOW-UP W/O CM").

Three-vessel coronary artery calcifications, aortic Atherosclerosis
(RGBNI-KT7.7) and Emphysema (RGBNI-JIK.2).

## 2023-10-08 DIAGNOSIS — M059 Rheumatoid arthritis with rheumatoid factor, unspecified: Secondary | ICD-10-CM | POA: Diagnosis not present

## 2023-10-08 DIAGNOSIS — M0579 Rheumatoid arthritis with rheumatoid factor of multiple sites without organ or systems involvement: Secondary | ICD-10-CM | POA: Diagnosis not present

## 2023-10-08 DIAGNOSIS — I159 Secondary hypertension, unspecified: Secondary | ICD-10-CM | POA: Diagnosis not present

## 2023-10-08 DIAGNOSIS — Z79899 Other long term (current) drug therapy: Secondary | ICD-10-CM | POA: Diagnosis not present

## 2023-10-09 DIAGNOSIS — M0579 Rheumatoid arthritis with rheumatoid factor of multiple sites without organ or systems involvement: Secondary | ICD-10-CM | POA: Insufficient documentation

## 2023-10-10 ENCOUNTER — Ambulatory Visit (INDEPENDENT_AMBULATORY_CARE_PROVIDER_SITE_OTHER): Payer: Medicare Other | Admitting: Nurse Practitioner

## 2023-10-10 ENCOUNTER — Encounter: Payer: Self-pay | Admitting: Nurse Practitioner

## 2023-10-10 VITALS — BP 154/81 | HR 69 | Temp 97.4°F | Ht 65.0 in | Wt 155.0 lb

## 2023-10-10 DIAGNOSIS — M8588 Other specified disorders of bone density and structure, other site: Secondary | ICD-10-CM

## 2023-10-10 DIAGNOSIS — Z8673 Personal history of transient ischemic attack (TIA), and cerebral infarction without residual deficits: Secondary | ICD-10-CM

## 2023-10-10 DIAGNOSIS — E119 Type 2 diabetes mellitus without complications: Secondary | ICD-10-CM

## 2023-10-10 DIAGNOSIS — F17200 Nicotine dependence, unspecified, uncomplicated: Secondary | ICD-10-CM

## 2023-10-10 DIAGNOSIS — F3342 Major depressive disorder, recurrent, in full remission: Secondary | ICD-10-CM

## 2023-10-10 DIAGNOSIS — I10 Essential (primary) hypertension: Secondary | ICD-10-CM

## 2023-10-10 DIAGNOSIS — E782 Mixed hyperlipidemia: Secondary | ICD-10-CM | POA: Diagnosis not present

## 2023-10-10 DIAGNOSIS — Z7984 Long term (current) use of oral hypoglycemic drugs: Secondary | ICD-10-CM | POA: Diagnosis not present

## 2023-10-10 LAB — LIPID PANEL

## 2023-10-10 LAB — BAYER DCA HB A1C WAIVED: HB A1C (BAYER DCA - WAIVED): 6.1 % — ABNORMAL HIGH (ref 4.8–5.6)

## 2023-10-10 MED ORDER — FENOFIBRATE 160 MG PO TABS
160.0000 mg | ORAL_TABLET | Freq: Every day | ORAL | 1 refills | Status: DC
Start: 2023-10-10 — End: 2024-04-16

## 2023-10-10 MED ORDER — ATORVASTATIN CALCIUM 40 MG PO TABS: 40.0000 mg | ORAL_TABLET | Freq: Every day | ORAL | 1 refills | Status: DC

## 2023-10-10 MED ORDER — METFORMIN HCL 1000 MG PO TABS: 1000.0000 mg | ORAL_TABLET | Freq: Two times a day (BID) | ORAL | 1 refills | Status: DC

## 2023-10-10 MED ORDER — LISINOPRIL 40 MG PO TABS: 40.0000 mg | ORAL_TABLET | Freq: Every day | ORAL | 3 refills | Status: DC

## 2023-10-10 MED ORDER — ESCITALOPRAM OXALATE 20 MG PO TABS: 20.0000 mg | ORAL_TABLET | Freq: Every day | ORAL | 1 refills | Status: DC

## 2023-10-10 NOTE — Patient Instructions (Signed)

## 2023-10-10 NOTE — Progress Notes (Signed)
Subjective:    Patient ID: Lisa Ball, female    DOB: 1960-12-07, 63 y.o.   MRN: [id number]   Chief Complaint: medical management of chronic issues     HPI:  Lisa Ball is a 63 y.o. who identifies as a female who was assigned female at birth.   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   Comes in today for follow up of the following chronic medical issues:  1. Primary hypertension No c/o chest pain, sob or headache. Doe snot check blood pressure at home. BP Readings from Last 3 Encounters:  04/07/23 135/81  12/31/22 (!) 88/57  11/27/22 116/67     2. History of cerebrovascular accident (CVA) Has no permanent effects  3. Mixed hyperlipidemia Doe snot really watch  diet and does no dedicated exercise Lab Results  Component Value Date   CHOL 108 04/07/2023   HDL 28 (L) 04/07/2023   LDLCALC 54 04/07/2023   TRIG 146 04/07/2023   CHOLHDL 3.9 04/07/2023     4. Osteopenia of lumbar spine No weight bearing exercises. Last dexascan was done on 01/03/22. T score was -0.1 which was normal.  5. Recurrent major depressive disorder, in full remission ([name]) Has been on lexapro for awhile. Says she is doing well.     10/10/2023   11:40 AM 05/02/2023    9:38 AM 04/07/2023   11:45 AM  Depression screen PHQ 2/9  Decreased Interest 0 0 0  Down, Depressed, Hopeless 0 0 0  PHQ - 2 Score 0 0 0  Altered sleeping  0 0  Tired, decreased energy  0 0  Change in appetite  0 0  Feeling bad or failure about yourself   0 0  Trouble concentrating  0 0  Moving slowly or fidgety/restless  0 0  Suicidal thoughts  0 0  PHQ-9 Score  0 0  Difficult doing work/chores  Not difficult at all Not difficult at all       6. Smoker Smokes over a pack a day. Low dose CT scan was done on 04/27/23, which showed no lung cancer findings.  7. Diabetes Fasting blood sugars are around 130-140 . She does not check her  blood sugars very often.  Lab Results  Component Value Date   HGBA1C 6.6 (H) 04/07/2023     8. BMI 29.0-29.9,adult Weight is down 4lbs Wt Readings from Last 3 Encounters:  10/10/23 155 lb (70.3 kg)  05/02/23 155 lb (70.3 kg)  04/07/23 155 lb (70.3 kg)   BMI Readings from Last 3 Encounters:  10/10/23 25.79 kg/m  05/02/23 25.79 kg/m  04/07/23 25.79 kg/m         New complaints: None today  Allergies  Allergen Reactions   Methotrexate Derivatives Shortness Of Breath   Penicillins Other (See Comments)    Couldn't  move   Outpatient Encounter Medications as of 10/10/2023  Medication Sig   albuterol (VENTOLIN HFA) 108 (90 Base) MCG/ACT inhaler Inhale 2 puffs into the lungs every 6 (six) hours as needed for wheezing or shortness of breath.   aspirin EC 81 MG tablet Take 81 mg by mouth daily.   atorvastatin (LIPITOR) 40 MG tablet Take 1 tablet (40 mg total) by mouth daily.   cephALEXin (KEFLEX) 500 MG capsule Take 1 capsule (500 mg total) by mouth 2 (two) times daily. (Patient not taking: Reported on 05/02/2023)   escitalopram (LEXAPRO) 20 MG  tablet Take 1 tablet (20 mg total) by mouth daily.   fenofibrate 160 MG tablet Take 1 tablet (160 mg total) by mouth daily.   lisinopril (ZESTRIL) 20 MG tablet Take 1 tablet (20 mg total) by mouth daily.   metFORMIN (GLUCOPHAGE) 1000 MG tablet Take 1 tablet (1,000 mg total) by mouth 2 (two) times daily with a meal.   predniSONE (DELTASONE) 10 MG tablet Take by mouth.   Secukinumab (COSENTYX SENSOREADY PEN) 150 MG/ML SOAJ Inject the contents of 1 pen into the skin every 4 weeks   No facility-administered encounter medications on file as of 10/10/2023.    Past Surgical History:  Procedure Laterality Date   BREAST CYST EXCISION Left    many years ago   CHOLECYSTECTOMY     TUBAL LIGATION      Family History  Problem Relation Age of Onset   Heart disease Mother    Cancer Father 17       lung cancer-died at age 51   Diabetes  Sister    Seizures Maternal Grandmother    Heart disease Maternal Grandfather    Seizures Paternal Grandmother    Heart disease Paternal Grandfather    Breast cancer Paternal Aunt    Colon cancer Neg Hx    Esophageal cancer Neg Hx    Rectal cancer Neg Hx    Stomach cancer Neg Hx       Controlled substance contract: n/a     Review of Systems  Constitutional:  Negative for diaphoresis.  Eyes:  Negative for pain.  Respiratory:  Negative for shortness of breath.   Cardiovascular:  Negative for chest pain, palpitations and leg swelling.  Gastrointestinal:  Negative for abdominal pain.  Endocrine: Negative for polydipsia.  Skin:  Negative for rash.  Neurological:  Negative for dizziness, weakness and headaches.  Hematological:  Does not bruise/bleed easily.  All other systems reviewed and are negative.      Objective:   Physical Exam Vitals and nursing note reviewed.  Constitutional:      General: She is not in acute distress.    Appearance: Normal appearance. She is well-developed.  HENT:     Head: Normocephalic.     Right Ear: Tympanic membrane normal.     Left Ear: Tympanic membrane normal.     Nose: Nose normal.     Mouth/Throat:     Mouth: Mucous membranes are moist.  Eyes:     Pupils: Pupils are equal, round, and reactive to light.  Neck:     Vascular: No carotid bruit or JVD.  Cardiovascular:     Rate and Rhythm: Normal rate and regular rhythm.     Heart sounds: Normal heart sounds.  Pulmonary:     Effort: Pulmonary effort is normal. No respiratory distress.     Breath sounds: Normal breath sounds. No wheezing or rales.  Chest:     Chest wall: No tenderness.  Abdominal:     General: Bowel sounds are normal. There is no distension or abdominal bruit.     Palpations: Abdomen is soft. There is no hepatomegaly, splenomegaly, mass or pulsatile mass.     Tenderness: There is no abdominal tenderness.  Musculoskeletal:        General: Normal range of motion.      Cervical back: Normal range of motion and neck supple.  Lymphadenopathy:     Cervical: No cervical adenopathy.  Skin:    General: Skin is warm and dry.  Neurological:  Mental Status: She is alert and oriented to person, place, and time.     Deep Tendon Reflexes: Reflexes are normal and symmetric.  Psychiatric:        Behavior: Behavior normal.        Thought Content: Thought content normal.        Judgment: Judgment normal.    BP (!) 154/81   Pulse 69   Temp (!) 97.4 F (36.3 C) (Temporal)   Ht 5\' 5"  (1.651 m)   Wt 155 lb (70.3 kg)   SpO2 93%   BMI 25.79 kg/m    Hgba1c 6.1%      Assessment & Plan:   Lisa Ball comes in today with chief complaint of No chief complaint on file.   Diagnosis and orders addressed:  1. Primary hypertension Low sodium diet Increase lisinopril to 40 mg daily - CBC with Differential/Platelet - CMP14+EGFR - lisinopril (ZESTRIL) 40 MG tablet; Take 1 tablet (40 mg total) by mouth daily.  Dispense: 90 tablet; Refill: 1  2. Late effect of cerebrovascular accident (CVA)  3. Mixed hyperlipidemia Low fat diet - Lipid panel - atorvastatin (LIPITOR) 40 MG tablet; Take 1 tablet (40 mg total) by mouth daily.  Dispense: 90 tablet; Refill: 1 - fenofibrate 160 MG tablet; Take 1 tablet (160 mg total) by mouth daily.  Dispense: 90 tablet; Refill: 1  4. Osteopenia of lumbar spine Weight bearing exercises  5. Recurrent major depressive disorder, in full remission ([name]) Stress management - escitalopram (LEXAPRO) 20 MG tablet; Take 1 tablet (20 mg total) by mouth daily.  Dispense: 90 tablet; Refill: 1  6. Diabetes mellitus treated with oral medication ([name]) Continue to watch carbs in diet - Bayer DCA Hb A1c Waived - metFORMIN (GLUCOPHAGE) 1000 MG tablet; Take 1 tablet (1,000 mg total) by mouth 2 (two) times daily with a meal.  Dispense: 180 tablet; Refill: 1  7. Smoker Smoking cessation encourage   8. BMI 29.0-29.9,adult Discussed diet  and exercise for person with BMI >25 Will recheck weight in 3-6 months    Labs pending Health Maintenance reviewed Diet and exercise encouraged  Follow up plan: 6 months   [name], FNP

## 2023-10-11 LAB — CMP14+EGFR
ALT: 13 IU/L (ref 0–32)
AST: 11 IU/L (ref 0–40)
Albumin: 4 g/dL (ref 3.9–4.9)
Alkaline Phosphatase: 65 IU/L (ref 44–121)
BUN/Creatinine Ratio: 9 — ABNORMAL LOW (ref 12–28)
BUN: 6 mg/dL — ABNORMAL LOW (ref 8–27)
Bilirubin Total: 0.6 mg/dL (ref 0.0–1.2)
CO2: 25 mmol/L (ref 20–29)
Calcium: 9.5 mg/dL (ref 8.7–10.3)
Chloride: 102 mmol/L (ref 96–106)
Creatinine, Ser: 0.7 mg/dL (ref 0.57–1.00)
Globulin, Total: 2.3 g/dL (ref 1.5–4.5)
Glucose: 93 mg/dL (ref 70–99)
Potassium: 3.3 mmol/L — ABNORMAL LOW (ref 3.5–5.2)
Sodium: 142 mmol/L (ref 134–144)
Total Protein: 6.3 g/dL (ref 6.0–8.5)
eGFR: 98 mL/min/{1.73_m2} (ref >59)

## 2023-10-11 LAB — CBC WITH DIFFERENTIAL/PLATELET
Basophils Absolute: 0.1 10*3/uL (ref 0.0–0.2)
Basos: 1 %
EOS (ABSOLUTE): 0.3 10*3/uL (ref 0.0–0.4)
Eos: 4 %
Hematocrit: 45 % (ref 34.0–46.6)
Hemoglobin: 14.8 g/dL (ref 11.1–15.9)
Immature Grans (Abs): 0 10*3/uL (ref 0.0–0.1)
Immature Granulocytes: 0 %
Lymphocytes Absolute: 1.8 10*3/uL (ref 0.7–3.1)
Lymphs: 26 %
MCH: 29.9 pg (ref 26.6–33.0)
MCHC: 32.9 g/dL (ref 31.5–35.7)
MCV: 91 fL (ref 79–97)
Monocytes Absolute: 0.7 10*3/uL (ref 0.1–0.9)
Monocytes: 10 %
Neutrophils Absolute: 4.1 10*3/uL (ref 1.4–7.0)
Neutrophils: 59 %
Platelets: 363 10*3/uL (ref 150–450)
RBC: 4.95 x10E6/uL (ref 3.77–5.28)
RDW: 12.8 % (ref 11.7–15.4)
WBC: 7 10*3/uL (ref 3.4–10.8)

## 2023-10-11 LAB — MICROALBUMIN / CREATININE URINE RATIO
Creatinine, Urine: 161.1 mg/dL
Microalb/Creat Ratio: 22 mg/g{creat} (ref 0–29)
Microalbumin, Urine: 35.5 ug/mL

## 2023-10-11 LAB — LIPID PANEL
Cholesterol, Total: 102 mg/dL (ref 100–199)
HDL: 30 mg/dL — ABNORMAL LOW (ref >39)
LDL CALC COMMENT:: 3.4 ratio (ref 0.0–4.4)
LDL Chol Calc (NIH): 50 mg/dL (ref 0–99)
Triglycerides: 118 mg/dL (ref 0–149)
VLDL Cholesterol Cal: 22 mg/dL (ref 5–40)

## 2023-10-12 DIAGNOSIS — R519 Headache, unspecified: Secondary | ICD-10-CM | POA: Diagnosis not present

## 2023-10-12 DIAGNOSIS — F17210 Nicotine dependence, cigarettes, uncomplicated: Secondary | ICD-10-CM | POA: Diagnosis not present

## 2023-10-12 DIAGNOSIS — R112 Nausea with vomiting, unspecified: Secondary | ICD-10-CM | POA: Diagnosis not present

## 2023-10-12 DIAGNOSIS — Z7982 Long term (current) use of aspirin: Secondary | ICD-10-CM | POA: Diagnosis not present

## 2023-10-12 DIAGNOSIS — I10 Essential (primary) hypertension: Secondary | ICD-10-CM | POA: Diagnosis not present

## 2023-10-12 DIAGNOSIS — J449 Chronic obstructive pulmonary disease, unspecified: Secondary | ICD-10-CM | POA: Diagnosis not present

## 2023-10-12 DIAGNOSIS — Z79899 Other long term (current) drug therapy: Secondary | ICD-10-CM | POA: Diagnosis not present

## 2023-10-12 DIAGNOSIS — Z880 Allergy status to penicillin: Secondary | ICD-10-CM | POA: Diagnosis not present

## 2023-10-13 MED ORDER — POTASSIUM CHLORIDE CRYS ER 20 MEQ PO TBCR: 20.0000 meq | EXTENDED_RELEASE_TABLET | Freq: Every day | ORAL | 1 refills | Status: DC

## 2023-10-13 NOTE — Addendum Note (Signed)
Addended by: [name] on: 10/13/2023 09:35 AM   Modules accepted: Orders

## 2023-11-10 ENCOUNTER — Encounter (INDEPENDENT_AMBULATORY_CARE_PROVIDER_SITE_OTHER): Payer: Self-pay | Admitting: *Deleted

## 2024-02-18 DIAGNOSIS — Z79899 Other long term (current) drug therapy: Secondary | ICD-10-CM | POA: Diagnosis not present

## 2024-02-18 DIAGNOSIS — M0579 Rheumatoid arthritis with rheumatoid factor of multiple sites without organ or systems involvement: Secondary | ICD-10-CM | POA: Diagnosis not present

## 2024-03-29 ENCOUNTER — Ambulatory Visit: Payer: Self-pay

## 2024-03-29 NOTE — Telephone Encounter (Signed)
 FYI Only or Action Required?: FYI only for provider.  Patient was last seen in primary care on 10/10/2023 by Gladis Mustard, FNP.  Called Nurse Triage reporting Hip Pain.  Symptoms began several weeks ago.  Interventions attempted: Nothing.  Symptoms are: gradually worsening.  Triage Disposition: See HCP Within 4 Hours (Or PCP Triage)  Patient/caregiver understands and will follow disposition?: No  Copied from CRM [id number]. Topic: Clinical - Red Word Triage >> Mar 29, 2024 10:36 AM Tiffany B wrote: Red Word that prompted transfer to Nurse Triage: Experiencing severe left hip pain. Reason for Disposition  [1] SEVERE pain (e.g., excruciating, unable to do any normal activities) AND [2] not improved after 2 hours of pain medicine  Answer Assessment - Initial Assessment Questions 1. LOCATION and RADIATION: Where is the pain located? Does the pain spread (shoot) anywhere else?     Left Hip, Lower Back  2. QUALITY: What does the pain feel like?  (e.g., sharp, dull, aching, burning)     Aching  3. SEVERITY: How bad is the pain? What does it keep you from doing?   (Scale 1-10; or mild, moderate, severe)     10  4. ONSET: When did the pain start? Does it come and go, or is it there all the time?     Several weeks ago  5. WORK OR EXERCISE: Has there been any recent work or exercise that involved this part of the body?      No  6. CAUSE: What do you think is causing the hip pain?      Unsure  7. AGGRAVATING FACTORS: What makes the hip pain worse? (e.g., walking, climbing stairs, running)     Standing  8. OTHER SYMPTOMS: Do you have any other symptoms? (e.g., back pain, pain shooting down leg,  fever, rash)     Back Pain  Protocols used: Hip Pain-A-AH

## 2024-03-30 ENCOUNTER — Encounter: Payer: Self-pay | Admitting: Family Medicine

## 2024-03-30 ENCOUNTER — Ambulatory Visit: Admitting: Family Medicine

## 2024-03-30 ENCOUNTER — Ambulatory Visit (INDEPENDENT_AMBULATORY_CARE_PROVIDER_SITE_OTHER)

## 2024-03-30 VITALS — BP 158/87 | HR 75 | Temp 97.5°F | Ht 65.0 in | Wt 153.4 lb

## 2024-03-30 DIAGNOSIS — M5442 Lumbago with sciatica, left side: Secondary | ICD-10-CM

## 2024-03-30 MED ORDER — METHYLPREDNISOLONE ACETATE 40 MG/ML IJ SUSP
40.0000 mg | Freq: Once | INTRAMUSCULAR | Status: AC
  Administered 2024-03-30: 60 mg via INTRAMUSCULAR

## 2024-03-30 NOTE — Progress Notes (Signed)
 Subjective:  Patient ID: Lisa Ball, female    DOB: [birth year], 63 y.o.   MRN: [id number]  Patient Care Team: Gladis Mustard, FNP as PCP - General (Nurse Practitioner)   Chief Complaint:  Hip Pain (Left x 1 month )   HPI: Lisa Ball is a 63 y.o. female presenting on 03/30/2024 for Hip Pain (Left x 1 month )   The patient presents with lower back pain radiating to the left hip.  She has been experiencing sharp pain in her lower back that radiates to her left hip for about a month, without any known injuries causing the pain.  She has been taking ibuprofen, Tylenol 3, and steroids (5 mg, three times a day) for the past two weeks, which have provided some relief. She has not had physical therapy for this issue before.  She recalls a previous encounter with a chiropractor who reviewed x-rays from two years ago but did not proceed with treatment due to concerns about a potential blood clot if something was chipped.  She is currently under rheumatology care.  No loss of bowel or bladder control, no saddle anesthesia.       Relevant past medical, surgical, family, and social history reviewed and updated as indicated.  Allergies and medications reviewed and updated. Data reviewed: Chart in Epic.   Past Medical History:  Diagnosis Date   Anxiety    Colon polyps    Depression    Hyperlipidemia    Hypertension    Psoriatic arthritis ([name])    Pulmonary fibrosis ([name])    RA (rheumatoid arthritis) ([name])     Past Surgical History:  Procedure Laterality Date   BREAST CYST EXCISION Left    many years ago   CHOLECYSTECTOMY     TUBAL LIGATION      Social History   Socioeconomic History   Marital status: Widowed    Spouse name: Not on file   Number of children: 1   Years of education: 9   Highest education level: 9th grade  Occupational History   Occupation: disability  Tobacco Use   Smoking status: Every Day    Current packs/day: 1.00    Average  packs/day: 1 pack/day for 35.0 years (35.0 ttl pk-yrs)    Types: Cigarettes   Smokeless tobacco: Never  Vaping Use   Vaping status: Never Used  Substance and Sexual Activity   Alcohol use: No   Drug use: No   Sexual activity: Not Currently    Birth control/protection: Surgical  Other Topics Concern   Not on file  Social History Narrative   Not on file   Social Drivers of Health   Financial Resource Strain: Low Risk  (05/02/2023)   Overall Financial Resource Strain (CARDIA)    Difficulty of Paying Living Expenses: Not hard at all  Food Insecurity: No Food Insecurity (05/02/2023)   Hunger Vital Sign    Worried About Running Out of Food in the Last Year: Never true    Ran Out of Food in the Last Year: Never true  Transportation Needs: No Transportation Needs (05/02/2023)   PRAPARE - Administrator, Civil Service (Medical): No    Lack of Transportation (Non-Medical): No  Physical Activity: Inactive (05/02/2023)   Exercise Vital Sign    Days of Exercise per Week: 0 days    Minutes of Exercise per Session: 0 min  Stress: No Stress Concern Present (05/02/2023)   [employer] of Occupational Health - Occupational  Stress Questionnaire    Feeling of Stress : Not at all  Social Connections: Moderately Isolated (05/02/2023)   Social Connection and Isolation Panel    Frequency of Communication with Friends and Family: More than three times a week    Frequency of Social Gatherings with Friends and Family: More than three times a week    Attends Religious Services: 1 to 4 times per year    Active Member of [employer] or Organizations: No    Attends Banker Meetings: Never    Marital Status: Widowed  Intimate Partner Violence: Not At Risk (10/12/2023)   Received from [facility]   HITS    Over the last 12 months how often did your partner physically hurt you?: Never    Over the last 12 months how often did your partner insult you or talk down to you?: Never    Over  the last 12 months how often did your partner threaten you with physical harm?: Never    Over the last 12 months how often did your partner scream or curse at you?: Never    Outpatient Encounter Medications as of 03/30/2024  Medication Sig   albuterol  (VENTOLIN  HFA) 108 (90 Base) MCG/ACT inhaler Inhale 2 puffs into the lungs every 6 (six) hours as needed for wheezing or shortness of breath.   aspirin EC 81 MG tablet Take 81 mg by mouth daily.   atorvastatin  (LIPITOR) 40 MG tablet Take 1 tablet (40 mg total) by mouth daily.   diclofenac Sodium (VOLTAREN) 1 % GEL Apply 2 g topically 4 (four) times daily.   escitalopram  (LEXAPRO ) 20 MG tablet Take 1 tablet (20 mg total) by mouth daily.   fenofibrate  160 MG tablet Take 1 tablet (160 mg total) by mouth daily.   hydroxychloroquine (PLAQUENIL) 200 MG tablet Take 1 tablet by mouth daily.   lisinopril  (ZESTRIL ) 40 MG tablet Take 1 tablet (40 mg total) by mouth daily.   metFORMIN  (GLUCOPHAGE ) 1000 MG tablet Take 1 tablet (1,000 mg total) by mouth 2 (two) times daily with a meal.   ORENCIA CLICKJECT 125 MG/ML SOAJ Inject 125 mg into the skin.   potassium chloride  SA (KLOR-CON  M) 20 MEQ tablet Take 1 tablet (20 mEq total) by mouth daily.   Secukinumab (COSENTYX SENSOREADY PEN) 150 MG/ML SOAJ Inject the contents of 1 pen into the skin every 4 weeks   [EXPIRED] methylPREDNISolone  acetate (DEPO-MEDROL ) injection 40 mg    No facility-administered encounter medications on file as of 03/30/2024.    Allergies  Allergen Reactions   Methotrexate Other (See Comments) and Shortness Of Breath    Lung problems per patient.   Methotrexate Derivatives Shortness Of Breath   Penicillins Other (See Comments)    Couldn't  move    Pertinent ROS per HPI, otherwise unremarkable      Objective:  BP (!) 158/87   Pulse 75   Temp (!) 97.5 F (36.4 C)   Ht 5' 5 (1.651 m)   Wt 153 lb 6.4 oz (69.6 kg)   SpO2 95%   BMI 25.53 kg/m    Wt Readings from Last 3  Encounters:  03/30/24 153 lb 6.4 oz (69.6 kg)  10/10/23 155 lb (70.3 kg)  05/02/23 155 lb (70.3 kg)    Physical Exam Vitals and nursing note reviewed.  Constitutional:      General: She is not in acute distress.    Appearance: She is not ill-appearing, toxic-appearing or diaphoretic.     Comments: Appears  uncomfortable  HENT:     Head: Normocephalic and atraumatic.     Nose: Nose normal.     Mouth/Throat:     Mouth: Mucous membranes are moist.  Eyes:     Conjunctiva/sclera: Conjunctivae normal.  Cardiovascular:     Rate and Rhythm: Normal rate and regular rhythm.     Pulses: Normal pulses.     Heart sounds: Normal heart sounds.  Pulmonary:     Effort: Pulmonary effort is normal.     Breath sounds: Normal breath sounds.  Musculoskeletal:     Cervical back: Normal.     Thoracic back: Normal.     Lumbar back: Tenderness present. No swelling, edema, deformity, signs of trauma, lacerations, spasms or bony tenderness. Decreased range of motion. Positive left straight leg raise test. Negative right straight leg raise test. Scoliosis present.     Right hip: Normal.     Left hip: Normal.     Right lower leg: No edema.     Left lower leg: No edema.  Skin:    General: Skin is warm and dry.     Capillary Refill: Capillary refill takes less than 2 seconds.  Neurological:     General: No focal deficit present.     Mental Status: She is alert and oriented to person, place, and time.     Gait: Gait abnormal (slow, antalgic).  Psychiatric:        Mood and Affect: Mood normal.        Behavior: Behavior normal.        Thought Content: Thought content normal.        Judgment: Judgment normal.     Results for orders placed or performed in visit on 10/10/23  Bayer DCA Hb A1c Waived   Collection Time: 10/10/23 11:33 AM  Result Value Ref Range   HB A1C (BAYER DCA - WAIVED) 6.1 (H) 4.8 - 5.6 %  CBC with Differential/Platelet   Collection Time: 10/10/23 11:43 AM  Result Value Ref Range    WBC 7.0 3.4 - 10.8 x10E3/uL   RBC 4.95 3.77 - 5.28 x10E6/uL   Hemoglobin 14.8 11.1 - 15.9 g/dL   Hematocrit 54.9 65.9 - 46.6 %   MCV 91 79 - 97 fL   MCH 29.9 26.6 - 33.0 pg   MCHC 32.9 31.5 - 35.7 g/dL   RDW 87.1 88.2 - 84.5 %   Platelets 363 150 - 450 x10E3/uL   Neutrophils 59 Not Estab. %   Lymphs 26 Not Estab. %   Monocytes 10 Not Estab. %   Eos 4 Not Estab. %   Basos 1 Not Estab. %   Neutrophils Absolute 4.1 1.4 - 7.0 x10E3/uL   Lymphocytes Absolute 1.8 0.7 - 3.1 x10E3/uL   Monocytes Absolute 0.7 0.1 - 0.9 x10E3/uL   EOS (ABSOLUTE) 0.3 0.0 - 0.4 x10E3/uL   Basophils Absolute 0.1 0.0 - 0.2 x10E3/uL   Immature Granulocytes 0 Not Estab. %   Immature Grans (Abs) 0.0 0.0 - 0.1 x10E3/uL  CMP14+EGFR   Collection Time: 10/10/23 11:43 AM  Result Value Ref Range   Glucose 93 70 - 99 mg/dL   BUN 6 (L) 8 - 27 mg/dL   Creatinine, Ser 9.29 0.57 - 1.00 mg/dL   eGFR 98 >40 fO/fpw/8.26   BUN/Creatinine Ratio 9 (L) 12 - 28   Sodium 142 134 - 144 mmol/L   Potassium 3.3 (L) 3.5 - 5.2 mmol/L   Chloride 102 96 - 106 mmol/L   CO2 25  20 - 29 mmol/L   Calcium  9.5 8.7 - 10.3 mg/dL   Total Protein 6.3 6.0 - 8.5 g/dL   Albumin 4.0 3.9 - 4.9 g/dL   Globulin, Total 2.3 1.5 - 4.5 g/dL   Bilirubin Total 0.6 0.0 - 1.2 mg/dL   Alkaline Phosphatase 65 44 - 121 IU/L   AST 11 0 - 40 IU/L   ALT 13 0 - 32 IU/L  Lipid panel   Collection Time: 10/10/23 11:43 AM  Result Value Ref Range   Cholesterol, Total 102 100 - 199 mg/dL   Triglycerides 881 0 - 149 mg/dL   HDL 30 (L) >60 mg/dL   VLDL Cholesterol Cal 22 5 - 40 mg/dL   LDL Chol Calc (NIH) 50 0 - 99 mg/dL   Chol/HDL Ratio 3.4 0.0 - 4.4 ratio  Microalbumin / creatinine urine ratio   Collection Time: 10/10/23 12:12 PM  Result Value Ref Range   Creatinine, Urine 161.1 Not Estab. mg/dL   Microalbumin, Urine 64.4 Not Estab. ug/mL   Microalb/Creat Ratio 22 0 - 29 mg/g creat       Pertinent labs & imaging results that were available during my care  of the patient were reviewed by me and considered in my medical decision making.  Assessment & Plan:  Lisaanne was seen today for hip pain.  Diagnoses and all orders for this visit:  Acute bilateral low back pain with left-sided sciatica -     DG Lumbar Spine 2-3 Views; Future -     Ambulatory referral to Physical Therapy -     Ambulatory referral to Neurosurgery -     methylPREDNISolone  acetate (DEPO-MEDROL ) injection 40 mg        Sciatica Presents with a one-month history of sharp lower back pain radiating to the left hip, consistent with sciatica due to nerve impingement. X-ray shows significant degenerative changes in the lumbar spine, no acute findings. Self-medicated with ibuprofen, acetaminophen, and low-dose steroids, providing some relief. Current steroid dose is inadequate for effective treatment. Informed about the need for emergency care if numbness, tingling in the perineal area, or loss of bowel/bladder function occurs, as these are spinal emergencies. - Administer steroid injection in the office for immediate relief. - Discontinue oral steroids taken at home. - Refer to physical therapy for management of sciatica. - Provide home exercise information for sciatica management. - Refer to a spine specialist for further evaluation. - Instruct to seek emergency care if numbness, tingling in the perineal area, or loss of bowel/bladder function occurs.  Rheumatoid Arthritis Under rheumatology care, likely on Cosentyx for management. Exact medication regimen not confirmed during the visit. - Continue current rheumatology medications as prescribed. - Follow up with rheumatologist as scheduled.          Continue all other maintenance medications.  Follow up plan: Return if symptoms worsen or fail to improve.   Continue healthy lifestyle choices, including diet (rich in fruits, vegetables, and lean proteins, and low in salt and simple carbohydrates) and exercise (at least 30  minutes of moderate physical activity daily).  Educational handout given for sciatica rehab  The above assessment and management plan was discussed with the patient. The patient verbalized understanding of and has agreed to the management plan. Patient is aware to call the clinic if they develop any new symptoms or if symptoms persist or worsen. Patient is aware when to return to the clinic for a follow-up visit. Patient educated on when it is appropriate to go to  the emergency department.   Rosaline Bruns, FNP-C [facility] [phone]

## 2024-04-01 ENCOUNTER — Ambulatory Visit: Payer: Self-pay | Admitting: Family Medicine

## 2024-04-01 DIAGNOSIS — M5442 Lumbago with sciatica, left side: Secondary | ICD-10-CM

## 2024-04-05 ENCOUNTER — Ambulatory Visit: Attending: Family Medicine

## 2024-04-05 DIAGNOSIS — M5459 Other low back pain: Secondary | ICD-10-CM | POA: Insufficient documentation

## 2024-04-05 DIAGNOSIS — M6281 Muscle weakness (generalized): Secondary | ICD-10-CM | POA: Diagnosis not present

## 2024-04-05 DIAGNOSIS — M79605 Pain in left leg: Secondary | ICD-10-CM | POA: Insufficient documentation

## 2024-04-05 DIAGNOSIS — M5442 Lumbago with sciatica, left side: Secondary | ICD-10-CM | POA: Insufficient documentation

## 2024-04-05 NOTE — Addendum Note (Signed)
 Addended by: ELSPETH LACINDA BROCKS on: 04/05/2024 04:49 PM   Modules accepted: Orders

## 2024-04-05 NOTE — Therapy (Addendum)
 OUTPATIENT PHYSICAL THERAPY THORACOLUMBAR EVALUATION   Patient Name: Lisa Ball MRN: [id number] DOB:03-Jan-1961, 63 y.o., female Today's Date: 04/05/2024  END OF SESSION:  PT End of Session - 04/05/24 1331     Visit Number 1    Number of Visits 12    Date for PT Re-Evaluation 06/11/24    PT Start Time 1346    PT Stop Time 1427    PT Time Calculation (min) 41 min    Activity Tolerance Patient tolerated treatment well    Behavior During Therapy WFL for tasks assessed/performed          Past Medical History:  Diagnosis Date   Anxiety    Colon polyps    Depression    Hyperlipidemia    Hypertension    Psoriatic arthritis ([name])    Pulmonary fibrosis ([name])    RA (rheumatoid arthritis) ([name])    Past Surgical History:  Procedure Laterality Date   BREAST CYST EXCISION Left    many years ago   CHOLECYSTECTOMY     TUBAL LIGATION     Patient Active Problem List   Diagnosis Date Noted   Seropositive rheumatoid arthritis of multiple sites ([name]) 10/09/2023   Diabetes mellitus treated with oral medication ([name]) 04/07/2023   History of CVA (cerebrovascular accident) 10/01/2021   Personal history of tobacco use, presenting hazards to health 06/05/2021   Osteoarthritis of multiple joints 10/27/2018   Psoriasis 12/23/2017   BMI 29.0-29.9,adult 06/20/2016   Smoker 09/19/2014   Depression 03/22/2013   Hypertension 03/22/2013   Osteopenia 03/22/2013   Hyperlipidemia 03/22/2013   Psoriatic arthritis ([name]) 03/22/2013    PCP: Gladis Ronal Quant, FNP  REFERRING PROVIDER: Severa Rock HERO, FNP  REFERRING DIAG: Acute bilateral low back pain with left-sided sciatica   Rationale for Evaluation and Treatment: Rehabilitation  THERAPY DIAG:  Other low back pain  Pain in left leg  Muscle weakness (generalized)  ONSET DATE: One month ago   SUBJECTIVE:                                                                                                                                                                                            SUBJECTIVE STATEMENT: Patient presents to physical therapy today with complaints of low back pain and hip pain. She states the pain starts in her back and moves down to lower butt area and down into the L leg. Patient states the pain has started about a month ago and has gotten worse. Patient reports the pain hurts the most in her back and butt when she's moving. Patient states they took steroids and had a shot  at the doctor in the hip and that helped. Patient states the MD sent them here and wanted to send them to a specialist but that hasn't happened yet. Patient tried to go to a chiropractor but he said they couldn't help her due to possible risk of blood clot. Patient helps her cousin who is disabled and hasn't been able to because of pain. She has been taking care of him for close to a decade starting in 2016. Pt denies dizziness, blurred vision, headaches, cancer, unremitting night pain, saddle anesthesia.   PERTINENT HISTORY:  DM, HTN, Osteopenia, RA, OA, depression, anxiety, smoker  PAIN:  Are you having pain? Yes: NPRS scale: 4-5/10 Pain location: Low back and glute region Pain description: sharp at times, can get dull when resting Aggravating factors: standing, laying flat on back,walking Relieving factors: shifting weight off the L, ibuprofen  PRECAUTIONS: None  RED FLAGS: None   WEIGHT BEARING RESTRICTIONS: No  FALLS:  Has patient fallen in last 6 months? No  LIVING ENVIRONMENT: Lives with: lives with their family, care taker to cousin Lives in: House/apartment Stairs: No Has following equipment at home: shower chair and Grab bars, ramp  OCCUPATION: caretaker   PLOF: Independent with community mobility without device  PATIENT GOALS: reduce pain, get back to care taking, cousin weighs 200 pounds and needs to pull him and turn him.  NEXT MD VISIT: 04/12/2024  OBJECTIVE:  Note: Objective measures were  completed at Evaluation unless otherwise noted.  DIAGNOSTIC FINDINGS:   03/30/24 lumbar x-ray 1. Grade 1 anterolisthesis of L4, presumed chronic. 2. Mild multilevel spondylosis with anterior osteophytosis and disc space height loss, greatest at L5-S1 where it is moderate. 3. Advanced facet arthropathy at L4-L5 and L5-S1.  PATIENT SURVEYS:  Modified Oswestry Disability Index: 10/50  COGNITION: Overall cognitive status: Within functional limits for tasks assessed     SENSATION: Diminished L2-S1 dermatomes on L compared to R   POSTURE: rounded shoulders, forward head, and decreased lumbar lordosis  PALPATION: Muscles: TTP along Lumbar paraspinals L3-L5, L Piriformis, L QL, L glute med.   Joint: Hypomobile L2-L5, PSIS was TTP  LUMBAR ROM:   AROM eval  Flexion 78  Extension 10  Right lateral flexion To patella  Left lateral flexion To patella  Right rotation 25% limited  Left rotation 25% limited   (Blank rows = not tested)   LOWER EXTREMITY MMT:    MMT Right eval Left eval  Hip flexion 4- 3+ painful  Hip extension    Hip abduction    Hip adduction    Hip internal rotation    Hip external rotation    Knee flexion 4 4-  Knee extension 4 4  Ankle dorsiflexion 4- 3+  Ankle plantarflexion    Ankle inversion    Ankle eversion     (Blank rows = not tested)  LUMBAR SPECIAL TESTS:  Slump test: Positive (L leg) R Leg (painful on L side) Repeated Motions: Forward flexion 10x, no exacerbation of symptoms   GAIT: Distance walked: To be evaluated next visit Assistive device utilized: None Level of assistance: Complete Independence Comments: Patient ambulates with decreased lordosis, decreased stance time on L.  TREATMENT DATE: 04/05/24  Manual Therapy: L2-L5 paraspinals, L QL, and L piriformis  PATIENT EDUCATION:  Education details:  Educated on probable sciatic pain stemming from piriformis, muscle guarding in low back, and patient sleeping positioning. As well as POC and HEP. Person educated: Patient Education method: Explanation, Demonstration, and Tactile cues Education comprehension: verbalized understanding  HOME EXERCISE PROGRAM: Sciatic Nerve Flossing 2x/day 2 minutes total, Piriformis tennis ball massage 2 min/day or to tolerance, standing flexion repeated motions 2 min/day.  ASSESSMENT:  CLINICAL IMPRESSION: Patient is a  63 y.o. F who was seen today for physical therapy evaluation and treatment for low back pain, and sciatic nerve pain. Patient presents to clinic without any AD today. Patient demonstrates decreased lumbar extension ROM limited due to pain and joint mobility. Palpation revealed muscle tightness in L paraspinals and QL as well as piriformis and glute med on L. Soft tissue mobilization was performed to these areas which patient responded well to. Repeated motions showed a patient preference to forward flexion without exacerbation of symptoms. Joint palpation to T12-L5 showed hypomobility from L2-L5 with increase in pain before R2. Significant muscle guarding of the paraspinals throughout joint assessment. Sensation testing showed reduced sensation on L side L2-S1, likely due to piriformis tightness and sciatic nerve entrapment. Patient educated on pain and POC to ease pain and impairments listed below. Patient would benefit form skilled physical therapy to address impairments.  OBJECTIVE IMPAIRMENTS: Abnormal gait, decreased activity tolerance, decreased mobility, difficulty walking, decreased ROM, decreased strength, increased muscle spasms, impaired sensation, and pain.   ACTIVITY LIMITATIONS: carrying, lifting, bending, standing, squatting, stairs, and transfers  PARTICIPATION LIMITATIONS: community activity and occupation  PERSONAL FACTORS: Age, Fitness, and 1-2 comorbidities: DM, RA are also  affecting patient's functional outcome.   REHAB POTENTIAL: Good  CLINICAL DECISION MAKING: Evolving/moderate complexity  EVALUATION COMPLEXITY: Moderate   GOALS: Goals reviewed with patient? No  SHORT TERM GOALS: Target date: 04/26/24  Patient will be compliant with HEP Baseline: Goal status: INITIAL  2.  Patient will increase lumbar extension AROM to 20 degrees in order to improve mobility for walking with proper body mechanics Baseline:  Goal status: INITIAL  3.  Patient will increase hip flexion strength to 4/5 bilaterally in order to improve body mechanics while lifting for work. Baseline:  Goal status: INITIAL    LONG TERM GOALS: Target date: 05/17/24  Patient will be able to pull 50 pounds with proper body mechanics and no pain in order to return to caretaking.  Baseline:  Goal status: INITIAL  2.  Patient will be able to maintain lumbar spine in neutral while lifting 20 pounds from ground, without pain, in order to improve function with ADL's Baseline:  Goal status: INITIAL  3.  Patient will be able to lay supine without increased pain in order to sleep through the night comfortably without pain. Baseline:  Goal status: INITIAL  4.  Patient will improve ODI by 5 points in order to demonstrate improved tolerance with ADL's. Baseline:  Goal status: INITIAL    PLAN:  PT FREQUENCY: 2x/week  PT DURATION: 6 weeks  PLANNED INTERVENTIONS: 97164- PT Re-evaluation, 97750- Physical Performance Testing, 97110-Therapeutic exercises, 97530- Therapeutic activity, [id number]- Neuromuscular re-education, 97535- Self Care, [id number]- Manual therapy, [id number]- Gait training, [phone]- Electrical stimulation (unattended), [phone]- Traction (mechanical), 20560 (1-2 muscles), [id number] (3+ muscles)- Dry Needling, Patient/Family education, Joint mobilization, Spinal mobilization, Cryotherapy, and Moist heat.  PLAN FOR NEXT SESSION: Plan to assess gait speed, perform manual therapy,  begin gentle  lumbar extension exercises, core strengthening and LE strengthening.   Estefana Jude, Student-PT 04/05/2024,  4:18 PM

## 2024-04-08 ENCOUNTER — Ambulatory Visit

## 2024-04-08 DIAGNOSIS — M6281 Muscle weakness (generalized): Secondary | ICD-10-CM | POA: Diagnosis not present

## 2024-04-08 DIAGNOSIS — M79605 Pain in left leg: Secondary | ICD-10-CM

## 2024-04-08 DIAGNOSIS — M5459 Other low back pain: Secondary | ICD-10-CM | POA: Diagnosis not present

## 2024-04-08 DIAGNOSIS — M5442 Lumbago with sciatica, left side: Secondary | ICD-10-CM | POA: Diagnosis not present

## 2024-04-08 NOTE — Therapy (Signed)
 OUTPATIENT PHYSICAL THERAPY THORACOLUMBAR TREATMENT   Patient Name: Lisa Ball MRN: [id number] DOB:1961-07-06, 63 y.o., female Today's Date: 04/08/2024  END OF SESSION:  PT End of Session - 04/08/24 1302     Visit Number 2    Number of Visits 12    Date for PT Re-Evaluation 06/11/24    PT Start Time 1302    PT Stop Time 1346    PT Time Calculation (min) 44 min    Activity Tolerance Patient tolerated treatment well    Behavior During Therapy [facility] for tasks assessed/performed          Past Medical History:  Diagnosis Date   Anxiety    Colon polyps    Depression    Hyperlipidemia    Hypertension    Psoriatic arthritis ([name])    Pulmonary fibrosis ([name])    RA (rheumatoid arthritis) ([name])    Past Surgical History:  Procedure Laterality Date   BREAST CYST EXCISION Left    many years ago   CHOLECYSTECTOMY     TUBAL LIGATION     Patient Active Problem List   Diagnosis Date Noted   Seropositive rheumatoid arthritis of multiple sites ([name]) 10/09/2023   Diabetes mellitus treated with oral medication ([name]) 04/07/2023   History of CVA (cerebrovascular accident) 10/01/2021   Personal history of tobacco use, presenting hazards to health 06/05/2021   Osteoarthritis of multiple joints 10/27/2018   Psoriasis 12/23/2017   BMI 29.0-29.9,adult 06/20/2016   Smoker 09/19/2014   Depression 03/22/2013   Hypertension 03/22/2013   Osteopenia 03/22/2013   Hyperlipidemia 03/22/2013   Psoriatic arthritis ([name]) 03/22/2013    PCP: Gladis Ronal Quant, FNP  REFERRING PROVIDER: Severa Rock HERO, FNP  REFERRING DIAG: Acute bilateral low back pain with left-sided sciatica   Rationale for Evaluation and Treatment: Rehabilitation  THERAPY DIAG:  Pain in left leg  Other low back pain  Muscle weakness (generalized)  ONSET DATE: One month ago   SUBJECTIVE:                                                                                                                                                                                            SUBJECTIVE STATEMENT: Patient states they are doing good today. There pain since last visit has been about a 4/10.  PERTINENT HISTORY:  DM, HTN, Osteopenia, RA, OA, depression, anxiety, smoker  PAIN:  Are you having pain? Yes: NPRS scale: 4/10 Pain location: Low back and glute region Pain description: sharp at times, can get dull when resting Aggravating factors: standing, laying flat on back,walking Relieving factors: shifting weight off the L, ibuprofen  PRECAUTIONS:  None  RED FLAGS: None   WEIGHT BEARING RESTRICTIONS: No  FALLS:  Has patient fallen in last 6 months? No  LIVING ENVIRONMENT: Lives with: lives with their family, care taker to cousin Lives in: House/apartment Stairs: No Has following equipment at home: shower chair and Grab bars, ramp  OCCUPATION: caretaker   PLOF: Independent with community mobility without device  PATIENT GOALS: reduce pain, get back to care taking, cousin weighs 200 pounds and needs to pull him and turn him.  NEXT MD VISIT: 04/12/2024  OBJECTIVE:  Note: Objective measures were completed at Evaluation unless otherwise noted.  DIAGNOSTIC FINDINGS:   03/30/24 lumbar x-ray 1. Grade 1 anterolisthesis of L4, presumed chronic. 2. Mild multilevel spondylosis with anterior osteophytosis and disc space height loss, greatest at L5-S1 where it is moderate. 3. Advanced facet arthropathy at L4-L5 and L5-S1.  PATIENT SURVEYS:  Modified Oswestry Disability Index: 10/50  COGNITION: Overall cognitive status: Within functional limits for tasks assessed     SENSATION: Diminished L2-S1 dermatomes on L compared to R   POSTURE: rounded shoulders, forward head, and decreased lumbar lordosis  PALPATION: Muscles: TTP along Lumbar paraspinals L3-L5, L Piriformis, L QL, L glute med.   Joint: Hypomobile L2-L5, PSIS was TTP  LUMBAR ROM:   AROM eval  Flexion 78  Extension 10  Right  lateral flexion To patella  Left lateral flexion To patella  Right rotation 25% limited  Left rotation 25% limited   (Blank rows = not tested)   LOWER EXTREMITY MMT:    MMT Right eval Left eval  Hip flexion 4- 3+ painful  Hip extension    Hip abduction    Hip adduction    Hip internal rotation    Hip external rotation    Knee flexion 4 4-  Knee extension 4 4  Ankle dorsiflexion 4- 3+  Ankle plantarflexion    Ankle inversion    Ankle eversion     (Blank rows = not tested)  LUMBAR SPECIAL TESTS:  Slump test: Positive (L leg) R Leg (painful on L side) Repeated Motions: Forward flexion 10x, no exacerbation of symptoms   GAIT: Distance walked: To be evaluated next visit Assistive device utilized: None Level of assistance: Complete Independence Comments: Patient ambulates with decreased lordosis, decreased stance time on L.  TREATMENT DATE:  04/08/2024                                     EXERCISE LOG  Exercise Repetitions and Resistance Comments  TM 1. x 8 minutes   Ball roll outs 1x55minutes ea  Forward, both sides  Paloff press 2x10 w/ red tb   Seated Marching 3x45s w/ 2#   Sciatic Nerve glides 2x30s each leg Patient performing at home correctly  Seated Chops 2x10 ea side    Blank cell = exercise not performed today  Manual Therapy: STM L2-L5 L parspinals,  L Piriformis  04/05/24  Manual Therapy: L2-L5 paraspinals, L QL, and L piriformis  PATIENT EDUCATION:  Education details: positioning, core exercises and HEP. Person educated: Patient Education method: Explanation, Demonstration, and Tactile cues Education comprehension: verbalized understanding  HOME EXERCISE PROGRAM: Sciatic Nerve Flossing 2x/day 2 minutes total, Piriformis tennis ball massage 2 min/day or to tolerance, standing flexion repeated motions 2  min/day.  ASSESSMENT:  CLINICAL IMPRESSION:  Patient presents for visit 2 states she has been having minimal pain and responded well to manual therapy at the intitial evaluation. Worked today on core activation exercises and lumbar ROM in flexion and rotation. Patient was fatigued throughout core exercises today. Patient required SBA for paloff press today, and verbal cueing throughout treatment. Patient education given about deep core stabilization and strength in relation to back pain. Patient demonstrated their HEP and is compliant. Manual therapy performed to relieve muscular tension today which patient responded to well stating pain relief. Patient would benefit form skilled physical therapy to address impairments.  OBJECTIVE IMPAIRMENTS: Abnormal gait, decreased activity tolerance, decreased mobility, difficulty walking, decreased ROM, decreased strength, increased muscle spasms, impaired sensation, and pain.   ACTIVITY LIMITATIONS: carrying, lifting, bending, standing, squatting, stairs, and transfers  PARTICIPATION LIMITATIONS: community activity and occupation  PERSONAL FACTORS: Age, Fitness, and 1-2 comorbidities: DM, RA are also affecting patient's functional outcome.   REHAB POTENTIAL: Good  CLINICAL DECISION MAKING: Evolving/moderate complexity  EVALUATION COMPLEXITY: Moderate   GOALS: Goals reviewed with patient? No  SHORT TERM GOALS: Target date: 04/26/24  Patient will be compliant with HEP Baseline: Goal status: INITIAL  2.  Patient will increase lumbar extension AROM to 20 degrees in order to improve mobility for walking with proper body mechanics Baseline:  Goal status: INITIAL  3.  Patient will increase hip flexion strength to 4/5 bilaterally in order to improve body mechanics while lifting for work. Baseline:  Goal status: INITIAL    LONG TERM GOALS: Target date: 05/17/24  Patient will be able to pull 40 pounds with proper body mechanics and no pain in  order to return to caretaking.  Baseline:  Goal status: INITIAL  2.  Patient will be able to maintain lumbar spine in neutral while lifting 20 pounds from ground, without pain, in order to improve function with ADL's Baseline:  Goal status: INITIAL  3.  Patient will be able to lay supine without increased pain in order to sleep through the night comfortably without pain. Baseline:  Goal status: INITIAL  4.  Patient will improve ODI by 5 points in order to demonstrate improved tolerance with ADL's. Baseline:  Goal status: INITIAL    PLAN:  PT FREQUENCY: 2x/week  PT DURATION: 6 weeks  PLANNED INTERVENTIONS: 97164- PT Re-evaluation, 97750- Physical Performance Testing, 97110-Therapeutic exercises, 97530- Therapeutic activity, [id number]- Neuromuscular re-education, 97535- Self Care, 02859- Manual therapy, [id number]- Gait training, [phone]- Electrical stimulation (unattended), [phone]- Traction (mechanical), 20560 (1-2 muscles), 20561 (3+ muscles)- Dry Needling, Patient/Family education, Joint mobilization, Spinal mobilization, Cryotherapy, and Moist heat.  PLAN FOR NEXT SESSION: Plan perform manual therapy, gentle lumbar extension exercises, core strengthening and LE strengthening.   Estefana Jude, Student-PT 04/08/2024, 5:04 PM

## 2024-04-12 ENCOUNTER — Encounter (INDEPENDENT_AMBULATORY_CARE_PROVIDER_SITE_OTHER): Payer: Medicare Other | Admitting: Nurse Practitioner

## 2024-04-12 ENCOUNTER — Encounter: Payer: Self-pay | Admitting: Nurse Practitioner

## 2024-04-12 ENCOUNTER — Telehealth: Payer: Self-pay | Admitting: Nurse Practitioner

## 2024-04-12 NOTE — Telephone Encounter (Signed)
 Informed pt that her Lisinopril  was refilled in Jan for 3 mos supply w/ 3 refills to [facility] and there should be 2 more refills on file. She will check with the pharmacy

## 2024-04-12 NOTE — Telephone Encounter (Signed)
  Prescription Request  04/12/2024  Is this a Controlled Substance medicine? no  Have you seen your PCP in the last 2 weeks? Had apt 04/12/2024 thought is was at 11:30 r/s due to late policy. R/s for 04/16/2024  If YES, route message to pool  -  If NO, patient needs to be scheduled for appointment.  What is the name of the medication or equipment? lisinopril  (ZESTRIL ) 40 MG tablet   Have you contacted your pharmacy to request a refill? No needs new refills   Which pharmacy would you like this sent to? [facility]   Patient notified that their request is being sent to the clinical staff for review and that they should receive a response within 2 business days.

## 2024-04-12 NOTE — Progress Notes (Unsigned)
 Subjective:    Patient ID: Lisa Ball, female    DOB: [birth year], 63 y.o.   MRN: [id number]   Chief Complaint: annual physical    HPI:  Lisa Ball is a 63 y.o. who identifies as a female who was assigned female at birth.   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   Comes in today for follow up of the following chronic medical issues:  1. Primary hypertension No c/o chest pain, sob or headache. Doe snot check blood pressure at home. BP Readings from Last 3 Encounters:  03/30/24 (!) 158/87  10/10/23 (!) 154/81  04/07/23 135/81     2. History of cerebrovascular accident (CVA) Has no permanent effects  3. Mixed hyperlipidemia Doe snot really watch  diet and does no dedicated exercise Lab Results  Component Value Date   CHOL 102 10/10/2023   HDL 30 (L) 10/10/2023   LDLCALC 50 10/10/2023   TRIG 118 10/10/2023   CHOLHDL 3.4 10/10/2023     4. Osteopenia of lumbar spine No weight bearing exercises. Last dexascan was done on 01/03/22. T score was -0.1 which was normal.  5. Recurrent major depressive disorder, in full remission ([name]) Has been on lexapro  for awhile. Says she is doing well.     10/10/2023   11:40 AM 05/02/2023    9:38 AM 04/07/2023   11:45 AM  Depression screen PHQ 2/9  Decreased Interest 0 0 0  Down, Depressed, Hopeless 0 0 0  PHQ - 2 Score 0 0 0  Altered sleeping  0 0  Tired, decreased energy  0 0  Change in appetite  0 0  Feeling bad or failure about yourself   0 0  Trouble concentrating  0 0  Moving slowly or fidgety/restless  0 0  Suicidal thoughts  0 0  PHQ-9 Score  0 0  Difficult doing work/chores  Not difficult at all Not difficult at all       6. Smoker Smokes over a pack a day. Low dose CT scan was done on 04/27/23, which showed no lung cancer findings.  7. Diabetes Fasting blood sugars are around 130-140 . She does not check her blood sugars very  often.  Lab Results  Component Value Date   HGBA1C 6.1 (H) 10/10/2023     8. BMI 29.0-29.9,adult Weight is down 4lbs Wt Readings from Last 3 Encounters:  03/30/24 153 lb 6.4 oz (69.6 kg)  10/10/23 155 lb (70.3 kg)  05/02/23 155 lb (70.3 kg)   BMI Readings from Last 3 Encounters:  03/30/24 25.53 kg/m  10/10/23 25.79 kg/m  05/02/23 25.79 kg/m         New complaints: None today  Allergies  Allergen Reactions   Methotrexate Other (See Comments) and Shortness Of Breath    Lung problems per patient.   Methotrexate Derivatives Shortness Of Breath   Penicillins Other (See Comments)    Couldn't  move   Outpatient Encounter Medications as of 04/12/2024  Medication Sig   albuterol  (VENTOLIN  HFA) 108 (90 Base) MCG/ACT inhaler Inhale 2 puffs into the lungs every 6 (six) hours as needed for wheezing or shortness of breath.   aspirin EC 81 MG tablet Take 81 mg by mouth daily.   atorvastatin  (LIPITOR) 40 MG tablet Take 1 tablet (40 mg total) by mouth daily.   diclofenac Sodium (VOLTAREN) 1 % GEL Apply 2 g topically 4 (four) times  daily.   escitalopram  (LEXAPRO ) 20 MG tablet Take 1 tablet (20 mg total) by mouth daily.   fenofibrate  160 MG tablet Take 1 tablet (160 mg total) by mouth daily.   hydroxychloroquine (PLAQUENIL) 200 MG tablet Take 1 tablet by mouth daily.   lisinopril  (ZESTRIL ) 40 MG tablet Take 1 tablet (40 mg total) by mouth daily.   metFORMIN  (GLUCOPHAGE ) 1000 MG tablet Take 1 tablet (1,000 mg total) by mouth 2 (two) times daily with a meal.   ORENCIA CLICKJECT 125 MG/ML SOAJ Inject 125 mg into the skin.   potassium chloride  SA (KLOR-CON  M) 20 MEQ tablet Take 1 tablet (20 mEq total) by mouth daily.   Secukinumab (COSENTYX SENSOREADY PEN) 150 MG/ML SOAJ Inject the contents of 1 pen into the skin every 4 weeks   No facility-administered encounter medications on file as of 04/12/2024.    Past Surgical History:  Procedure Laterality Date   BREAST CYST EXCISION Left     many years ago   CHOLECYSTECTOMY     TUBAL LIGATION      Family History  Problem Relation Age of Onset   Heart disease Mother    Cancer Father 23       lung cancer-died at age 41   Diabetes Sister    Seizures Maternal Grandmother    Heart disease Maternal Grandfather    Seizures Paternal Grandmother    Heart disease Paternal Grandfather    Breast cancer Paternal Aunt    Colon cancer Neg Hx    Esophageal cancer Neg Hx    Rectal cancer Neg Hx    Stomach cancer Neg Hx       Controlled substance contract: n/a     Review of Systems  Constitutional:  Negative for diaphoresis.  Eyes:  Negative for pain.  Respiratory:  Negative for shortness of breath.   Cardiovascular:  Negative for chest pain, palpitations and leg swelling.  Gastrointestinal:  Negative for abdominal pain.  Endocrine: Negative for polydipsia.  Skin:  Negative for rash.  Neurological:  Negative for dizziness, weakness and headaches.  Hematological:  Does not bruise/bleed easily.  All other systems reviewed and are negative.      Objective:   Physical Exam Vitals and nursing note reviewed.  Constitutional:      General: She is not in acute distress.    Appearance: Normal appearance. She is well-developed.  HENT:     Head: Normocephalic.     Right Ear: Tympanic membrane normal.     Left Ear: Tympanic membrane normal.     Nose: Nose normal.     Mouth/Throat:     Mouth: Mucous membranes are moist.  Eyes:     Pupils: Pupils are equal, round, and reactive to light.  Neck:     Vascular: No carotid bruit or JVD.  Cardiovascular:     Rate and Rhythm: Normal rate and regular rhythm.     Heart sounds: Normal heart sounds.  Pulmonary:     Effort: Pulmonary effort is normal. No respiratory distress.     Breath sounds: Normal breath sounds. No wheezing or rales.  Chest:     Chest wall: No tenderness.  Abdominal:     General: Bowel sounds are normal. There is no distension or abdominal bruit.      Palpations: Abdomen is soft. There is no hepatomegaly, splenomegaly, mass or pulsatile mass.     Tenderness: There is no abdominal tenderness.  Musculoskeletal:        General: Normal range  of motion.     Cervical back: Normal range of motion and neck supple.  Lymphadenopathy:     Cervical: No cervical adenopathy.  Skin:    General: Skin is warm and dry.  Neurological:     Mental Status: She is alert and oriented to person, place, and time.     Deep Tendon Reflexes: Reflexes are normal and symmetric.  Psychiatric:        Behavior: Behavior normal.        Thought Content: Thought content normal.        Judgment: Judgment normal.    There were no vitals taken for this visit.   Hgba1c 6.1%      Assessment & Plan:   LOVA URBIETA comes in today with chief complaint of No chief complaint on file.   Diagnosis and orders addressed:  1. Primary hypertension Low sodium diet Increase lisinopril  to 40 mg daily - CBC with Differential/Platelet - CMP14+EGFR - lisinopril  (ZESTRIL ) 40 MG tablet; Take 1 tablet (40 mg total) by mouth daily.  Dispense: 90 tablet; Refill: 1  2. Late effect of cerebrovascular accident (CVA)  3. Mixed hyperlipidemia Low fat diet - Lipid panel - atorvastatin  (LIPITOR) 40 MG tablet; Take 1 tablet (40 mg total) by mouth daily.  Dispense: 90 tablet; Refill: 1 - fenofibrate  160 MG tablet; Take 1 tablet (160 mg total) by mouth daily.  Dispense: 90 tablet; Refill: 1  4. Osteopenia of lumbar spine Weight bearing exercises  5. Recurrent major depressive disorder, in full remission ([name]) Stress management - escitalopram  (LEXAPRO ) 20 MG tablet; Take 1 tablet (20 mg total) by mouth daily.  Dispense: 90 tablet; Refill: 1  6. Diabetes mellitus treated with oral medication ([name]) Continue to watch carbs in diet - Bayer DCA Hb A1c Waived - metFORMIN  (GLUCOPHAGE ) 1000 MG tablet; Take 1 tablet (1,000 mg total) by mouth 2 (two) times daily with a meal.  Dispense:  180 tablet; Refill: 1  7. Smoker Smoking cessation encourage   8. BMI 29.0-29.9,adult Discussed diet and exercise for person with BMI >25 Will recheck weight in 3-6 months    Labs pending Health Maintenance reviewed Diet and exercise encouraged  Follow up plan: 6 months   Mary-Margaret Gladis, FNP

## 2024-04-14 ENCOUNTER — Ambulatory Visit

## 2024-04-14 DIAGNOSIS — M5459 Other low back pain: Secondary | ICD-10-CM

## 2024-04-14 DIAGNOSIS — M5442 Lumbago with sciatica, left side: Secondary | ICD-10-CM | POA: Diagnosis not present

## 2024-04-14 DIAGNOSIS — M6281 Muscle weakness (generalized): Secondary | ICD-10-CM | POA: Diagnosis not present

## 2024-04-14 DIAGNOSIS — M79605 Pain in left leg: Secondary | ICD-10-CM

## 2024-04-14 NOTE — Therapy (Signed)
 OUTPATIENT PHYSICAL THERAPY THORACOLUMBAR TREATMENT   Patient Name: Lisa Ball MRN: [id number] DOB:03/06/1961, 63 y.o., female Today's Date: 04/14/2024  END OF SESSION:  PT End of Session - 04/14/24 1441     Visit Number 3    Number of Visits 12    Date for PT Re-Evaluation 06/11/24    PT Start Time 1431    PT Stop Time 1514    PT Time Calculation (min) 43 min    Activity Tolerance Patient tolerated treatment well    Behavior During Therapy [facility] for tasks assessed/performed          Past Medical History:  Diagnosis Date   Anxiety    Colon polyps    Depression    Hyperlipidemia    Hypertension    Psoriatic arthritis ([name])    Pulmonary fibrosis ([name])    RA (rheumatoid arthritis) ([name])    Past Surgical History:  Procedure Laterality Date   BREAST CYST EXCISION Left    many years ago   CHOLECYSTECTOMY     TUBAL LIGATION     Patient Active Problem List   Diagnosis Date Noted   Seropositive rheumatoid arthritis of multiple sites ([name]) 10/09/2023   Diabetes mellitus treated with oral medication ([name]) 04/07/2023   History of CVA (cerebrovascular accident) 10/01/2021   Personal history of tobacco use, presenting hazards to health 06/05/2021   Osteoarthritis of multiple joints 10/27/2018   Psoriasis 12/23/2017   BMI 29.0-29.9,adult 06/20/2016   Smoker 09/19/2014   Depression 03/22/2013   Hypertension 03/22/2013   Osteopenia 03/22/2013   Hyperlipidemia 03/22/2013   Psoriatic arthritis ([name]) 03/22/2013    PCP: Gladis Ronal Quant, FNP  REFERRING PROVIDER: Severa Rock HERO, FNP  REFERRING DIAG: Acute bilateral low back pain with left-sided sciatica   Rationale for Evaluation and Treatment: Rehabilitation  THERAPY DIAG:  Pain in left leg  Muscle weakness (generalized)  Other low back pain  ONSET DATE: One month ago   SUBJECTIVE:                                                                                                                                                                                            SUBJECTIVE STATEMENT: Patient states they are doing good and is feeling better since starting physical therapy. PERTINENT HISTORY:  DM, HTN, Osteopenia, RA, OA, depression, anxiety, smoker  PAIN:  Are you having pain? Yes: NPRS scale: 4/10 Pain location: Low back and glute region Pain description: sharp at times, can get dull when resting Aggravating factors: standing, laying flat on back,walking Relieving factors: shifting weight off the L, ibuprofen  PRECAUTIONS: None  RED FLAGS:  None   WEIGHT BEARING RESTRICTIONS: No  FALLS:  Has patient fallen in last 6 months? No  LIVING ENVIRONMENT: Lives with: lives with their family, care taker to cousin Lives in: House/apartment Stairs: No Has following equipment at home: shower chair and Grab bars, ramp  OCCUPATION: caretaker   PLOF: Independent with community mobility without device  PATIENT GOALS: reduce pain, get back to care taking, cousin weighs 200 pounds and needs to pull him and turn him.  NEXT MD VISIT: 04/12/2024  OBJECTIVE:  Note: Objective measures were completed at Evaluation unless otherwise noted.  DIAGNOSTIC FINDINGS:   03/30/24 lumbar x-ray 1. Grade 1 anterolisthesis of L4, presumed chronic. 2. Mild multilevel spondylosis with anterior osteophytosis and disc space height loss, greatest at L5-S1 where it is moderate. 3. Advanced facet arthropathy at L4-L5 and L5-S1.  PATIENT SURVEYS:  Modified Oswestry Disability Index: 10/50  COGNITION: Overall cognitive status: Within functional limits for tasks assessed     SENSATION: Diminished L2-S1 dermatomes on L compared to R   POSTURE: rounded shoulders, forward head, and decreased lumbar lordosis  PALPATION: Muscles: TTP along Lumbar paraspinals L3-L5, L Piriformis, L QL, L glute med.   Joint: Hypomobile L2-L5, PSIS was TTP  LUMBAR ROM:   AROM eval  Flexion 78  Extension 10  Right  lateral flexion To patella  Left lateral flexion To patella  Right rotation 25% limited  Left rotation 25% limited   (Blank rows = not tested)   LOWER EXTREMITY MMT:    MMT Right eval Left eval  Hip flexion 4- 3+ painful  Hip extension    Hip abduction    Hip adduction    Hip internal rotation    Hip external rotation    Knee flexion 4 4-  Knee extension 4 4  Ankle dorsiflexion 4- 3+  Ankle plantarflexion    Ankle inversion    Ankle eversion     (Blank rows = not tested)  LUMBAR SPECIAL TESTS:  Slump test: Positive (L leg) R Leg (painful on L side) Repeated Motions: Forward flexion 10x, no exacerbation of symptoms   GAIT: Distance walked: To be evaluated next visit Assistive device utilized: None Level of assistance: Complete Independence Comments: Patient ambulates with decreased lordosis, decreased stance time on L.  TREATMENT DATE:   04/14/2024                                    EXERCISE LOG  Exercise Repetitions and Resistance Comments  TM 1 MPH x 13 min   BTK's 1x60 s   Lumbar rotation 2x60s   Open Books 1x8 ea side   Prone press ups 1x15 On elbows  Seated Chops  2x10 ea side 2# med ball    Glute bridges 1x10   Paloff press 2x15 w/ red tb    Blank cell = exercise not performed today  Manual Therapy: STM L2-L5 L parspinals,  L Piriformis  04/08/2024                                     EXERCISE LOG  Exercise Repetitions and Resistance Comments  TM 1. x 8 minutes   Ball roll outs 1x18minutes ea  Forward, both sides  Paloff press 2x10 w/ red tb   Seated Marching 3x45s w/ 2#   Sciatic Nerve glides 2x30s each leg  Patient performing at home correctly  Seated Chops 2x10 ea side    Blank cell = exercise not performed today  Manual Therapy: STM L2-L5 L parspinals,  L Piriformis  04/05/24  Manual Therapy: L2-L5 paraspinals, L QL, and L piriformis                                                                                                                                PATIENT EDUCATION:  Education details: positioning, core exercises and HEP. Person educated: Patient Education method: Explanation, Demonstration, and Tactile cues Education comprehension: verbalized understanding  HOME EXERCISE PROGRAM: Sciatic Nerve Flossing 2x/day 2 minutes total, Piriformis tennis ball massage 2 min/day or to tolerance, standing flexion repeated motions 2 min/day.  ASSESSMENT:  CLINICAL IMPRESSION:  Patient presents for visit 3 stating they are doing better since beginning therapy. Progressed patient today with posterior chain activation exercises including bridges and prone press ups. Patient reported fatigue throughout these, however it did not provoke pain. Patient required verbal and tactile cueing today for proper body mechanics during core exercises. Added prone press ups to patients HEP to encourage strengthening to posterior core. Manual therapy with soft tissue mobilization performed to relieve muscular tension to L piriformis and L paraspinals. Patient would benefit form skilled physical therapy to address impairments.  OBJECTIVE IMPAIRMENTS: Abnormal gait, decreased activity tolerance, decreased mobility, difficulty walking, decreased ROM, decreased strength, increased muscle spasms, impaired sensation, and pain.   ACTIVITY LIMITATIONS: carrying, lifting, bending, standing, squatting, stairs, and transfers  PARTICIPATION LIMITATIONS: community activity and occupation  PERSONAL FACTORS: Age, Fitness, and 1-2 comorbidities: DM, RA are also affecting patient's functional outcome.   REHAB POTENTIAL: Good  CLINICAL DECISION MAKING: Evolving/moderate complexity  EVALUATION COMPLEXITY: Moderate   GOALS: Goals reviewed with patient? No  SHORT TERM GOALS: Target date: 04/26/24  Patient will be compliant with HEP Baseline: Goal status: INITIAL  2.  Patient will increase lumbar extension AROM to 20 degrees in order to improve  mobility for walking with proper body mechanics Baseline:  Goal status: INITIAL  3.  Patient will increase hip flexion strength to 4/5 bilaterally in order to improve body mechanics while lifting for work. Baseline:  Goal status: INITIAL    LONG TERM GOALS: Target date: 05/17/24  Patient will be able to pull 40 pounds with proper body mechanics and no pain in order to return to caretaking.  Baseline:  Goal status: INITIAL  2.  Patient will be able to maintain lumbar spine in neutral while lifting 20 pounds from ground, without pain, in order to improve function with ADL's Baseline:  Goal status: INITIAL  3.  Patient will be able to lay supine without increased pain in order to sleep through the night comfortably without pain. Baseline:  Goal status: INITIAL  4.  Patient will improve ODI by 5 points in order to demonstrate improved tolerance with ADL's. Baseline:  Goal status: INITIAL    PLAN:  PT  FREQUENCY: 2x/week  PT DURATION: 6 weeks  PLANNED INTERVENTIONS: 02835- PT Re-evaluation, 97750- Physical Performance Testing, 97110-Therapeutic exercises, 97530- Therapeutic activity, [id number]- Neuromuscular re-education, 97535- Self Care, [id number]- Manual therapy, [id number]- Gait training, [phone]- Electrical stimulation (unattended), [phone]- Traction (mechanical), [phone] (1-2 muscles), [id number] (3+ muscles)- Dry Needling, Patient/Family education, Joint mobilization, Spinal mobilization, Cryotherapy, and Moist heat.  PLAN FOR NEXT SESSION: Plan perform manual therapy, gentle lumbar extension exercises, core strengthening and LE strengthening.   Estefana Jude, Student-PT 04/14/2024, 4:47 PM

## 2024-04-16 ENCOUNTER — Encounter: Payer: Self-pay | Admitting: Nurse Practitioner

## 2024-04-16 ENCOUNTER — Ambulatory Visit: Admitting: Nurse Practitioner

## 2024-04-16 VITALS — BP 127/74 | HR 71 | Temp 97.7°F | Ht 65.0 in | Wt 151.0 lb

## 2024-04-16 DIAGNOSIS — E1159 Type 2 diabetes mellitus with other circulatory complications: Secondary | ICD-10-CM | POA: Diagnosis not present

## 2024-04-16 DIAGNOSIS — E1169 Type 2 diabetes mellitus with other specified complication: Secondary | ICD-10-CM

## 2024-04-16 DIAGNOSIS — Z7984 Long term (current) use of oral hypoglycemic drugs: Secondary | ICD-10-CM

## 2024-04-16 DIAGNOSIS — I152 Hypertension secondary to endocrine disorders: Secondary | ICD-10-CM

## 2024-04-16 DIAGNOSIS — Z8673 Personal history of transient ischemic attack (TIA), and cerebral infarction without residual deficits: Secondary | ICD-10-CM | POA: Diagnosis not present

## 2024-04-16 DIAGNOSIS — I10 Essential (primary) hypertension: Secondary | ICD-10-CM

## 2024-04-16 DIAGNOSIS — F3342 Major depressive disorder, recurrent, in full remission: Secondary | ICD-10-CM

## 2024-04-16 DIAGNOSIS — E782 Mixed hyperlipidemia: Secondary | ICD-10-CM

## 2024-04-16 DIAGNOSIS — M8588 Other specified disorders of bone density and structure, other site: Secondary | ICD-10-CM

## 2024-04-16 DIAGNOSIS — Z6829 Body mass index (BMI) 29.0-29.9, adult: Secondary | ICD-10-CM

## 2024-04-16 DIAGNOSIS — E785 Hyperlipidemia, unspecified: Secondary | ICD-10-CM

## 2024-04-16 DIAGNOSIS — E119 Type 2 diabetes mellitus without complications: Secondary | ICD-10-CM | POA: Diagnosis not present

## 2024-04-16 DIAGNOSIS — F17200 Nicotine dependence, unspecified, uncomplicated: Secondary | ICD-10-CM

## 2024-04-16 LAB — BAYER DCA HB A1C WAIVED: HB A1C (BAYER DCA - WAIVED): 5.4 % (ref 4.8–5.6)

## 2024-04-16 LAB — LIPID PANEL

## 2024-04-16 MED ORDER — ESCITALOPRAM OXALATE 20 MG PO TABS: 20.0000 mg | ORAL_TABLET | Freq: Every day | ORAL | 1 refills | Status: AC

## 2024-04-16 MED ORDER — POTASSIUM CHLORIDE CRYS ER 20 MEQ PO TBCR: 20.0000 meq | EXTENDED_RELEASE_TABLET | Freq: Every day | ORAL | 1 refills | Status: AC

## 2024-04-16 MED ORDER — FLUCONAZOLE 150 MG PO TABS: 150.0000 mg | ORAL_TABLET | Freq: Once | ORAL | 0 refills | Status: AC

## 2024-04-16 MED ORDER — FENOFIBRATE 160 MG PO TABS: 160.0000 mg | ORAL_TABLET | Freq: Every day | ORAL | 1 refills | Status: AC

## 2024-04-16 MED ORDER — ATORVASTATIN CALCIUM 40 MG PO TABS: 40.0000 mg | ORAL_TABLET | Freq: Every day | ORAL | 1 refills | Status: AC

## 2024-04-16 MED ORDER — LISINOPRIL 40 MG PO TABS
40.0000 mg | ORAL_TABLET | Freq: Every day | ORAL | 1 refills | Status: AC
Start: 2024-04-16 — End: ?

## 2024-04-16 MED ORDER — METFORMIN HCL 1000 MG PO TABS
1000.0000 mg | ORAL_TABLET | Freq: Two times a day (BID) | ORAL | 1 refills | Status: AC
Start: 2024-04-16 — End: ?

## 2024-04-16 NOTE — Progress Notes (Signed)
 Referring Physician:  Severa Rock HERO, FNP [address] [place],  [place] [address]  Primary Physician:  Gladis Mustard, FNP  History of Present Illness: 04/22/2024 Ms. Lisa Ball has a history of HTN, CVA, mixed hyperlipidemia, osteopenia, depression, DM, psoriatic arthritis, RA, pulmonary fibrosis.   She sees rheumatology.   Seen by PCP on 03/30/24 for 6 week history of constant LBP with radiation to left buttocks.   She has constant LBP with left buttock pain x years, but worse in last month. She has numbness in left foot. No tingling or weakness. Pain is worse with prolonged standing or sitting.   Some relief with steroids. Has tried motrin and tylenol #3. Has seen some improvement with PT.   Tobacco use: smokes 1 PPD x 35 years.   Bowel/Bladder Dysfunction: urinary urgency x year, no incontinence issues.   Conservative measures:  Physical therapy: initial eval at [facility] on 04/05/24 with 2 more visits through 04/16/24  Multimodal medical therapy including regular antiinflammatories: tylenol #3, motrin, steroids  Injections: No epidural steroid injections  Past Surgery: No lumbar surgery  Decklyn Hornik Koenigs has no symptoms of cervical myelopathy.  The symptoms are causing a significant impact on the patient's life.   Review of Systems:  A 10 point review of systems is negative, except for the pertinent positives and negatives detailed in the HPI.  Past Medical History: Past Medical History:  Diagnosis Date   Anxiety    Colon polyps    Depression    Hyperlipidemia    Hypertension    Psoriatic arthritis ([name])    Pulmonary fibrosis ([name])    RA (rheumatoid arthritis) ([name])     Past Surgical History: Past Surgical History:  Procedure Laterality Date   BREAST CYST EXCISION Left    many years ago   CHOLECYSTECTOMY     TUBAL LIGATION      Allergies: Allergies as of 04/22/2024 - Review Complete 04/22/2024  Allergen Reaction Noted   Methotrexate Other (See  Comments) and Shortness Of Breath 01/24/2012   Methotrexate derivatives Shortness Of Breath 01/24/2012   Penicillins Other (See Comments) 01/24/2012    Medications: Outpatient Encounter Medications as of 04/22/2024  Medication Sig   albuterol  (VENTOLIN  HFA) 108 (90 Base) MCG/ACT inhaler Inhale 2 puffs into the lungs every 6 (six) hours as needed for wheezing or shortness of breath.   aspirin EC 81 MG tablet Take 81 mg by mouth daily.   atorvastatin  (LIPITOR) 40 MG tablet Take 1 tablet (40 mg total) by mouth daily.   diclofenac Sodium (VOLTAREN) 1 % GEL Apply 2 g topically 4 (four) times daily.   escitalopram  (LEXAPRO ) 20 MG tablet Take 1 tablet (20 mg total) by mouth daily. (Patient taking differently: Take 40 mg by mouth daily.)   fenofibrate  160 MG tablet Take 1 tablet (160 mg total) by mouth daily.   hydroxychloroquine (PLAQUENIL) 200 MG tablet Take 1 tablet by mouth daily.   lisinopril  (ZESTRIL ) 40 MG tablet Take 1 tablet (40 mg total) by mouth daily.   metFORMIN  (GLUCOPHAGE ) 1000 MG tablet Take 1 tablet (1,000 mg total) by mouth 2 (two) times daily with a meal.   ORENCIA CLICKJECT 125 MG/ML SOAJ Inject 125 mg into the skin.   potassium chloride  SA (KLOR-CON  M) 20 MEQ tablet Take 1 tablet (20 mEq total) by mouth daily.   [EXPIRED] fluconazole  (DIFLUCAN ) 150 MG tablet Take 1 tablet (150 mg total) by mouth once for 1 dose. (Patient not taking: Reported on 04/22/2024)  Secukinumab (COSENTYX SENSOREADY PEN) 150 MG/ML SOAJ Inject the contents of 1 pen into the skin every 4 weeks (Patient not taking: Reported on 04/22/2024)   No facility-administered encounter medications on file as of 04/22/2024.    Social History: Social History   Tobacco Use   Smoking status: Every Day    Current packs/day: 1.00    Average packs/day: 1 pack/day for 35.0 years (35.0 ttl pk-yrs)    Types: Cigarettes   Smokeless tobacco: Never  Vaping Use   Vaping status: Never Used  Substance Use Topics   Alcohol use: No    Drug use: No    Family Medical History: Family History  Problem Relation Age of Onset   Heart disease Mother    Cancer Father 101       lung cancer-died at age 9   Diabetes Sister    Seizures Maternal Grandmother    Heart disease Maternal Grandfather    Seizures Paternal Grandmother    Heart disease Paternal Grandfather    Breast cancer Paternal Aunt    Colon cancer Neg Hx    Esophageal cancer Neg Hx    Rectal cancer Neg Hx    Stomach cancer Neg Hx     Physical Examination: Vitals:   04/22/24 1306  BP: 136/80    General: Patient is well developed, well nourished, calm, collected, and in no apparent distress. Attention to examination is appropriate.  Respiratory: Patient is breathing without any difficulty.   NEUROLOGICAL:     Awake, alert, oriented to person, place, and time.  Speech is clear and fluent. Fund of knowledge is appropriate.   Cranial Nerves: Pupils equal round and reactive to light.  Facial tone is symmetric.    No posterior lumbar tenderness.   No abnormal lesions on exposed skin.   Strength: Side Biceps Triceps Deltoid Interossei Grip Wrist Ext. Wrist Flex.  R 5 5 5 5 5 5 5   L 5 5 5 5 5 5 5    Side Iliopsoas Quads Hamstring PF DF EHL  R 5 5 5 5 5 5   L 5 5 5 5 5 5    Reflexes are 2+ and symmetric at the biceps, brachioradialis, patella and achilles.   Hoffman's is absent.  Clonus is not present.   Bilateral upper and lower extremity sensation is intact to light touch.     No pain with IR/ER of both hips.   Gait is normal.     Medical Decision Making  Imaging: Lumbar xrays dated 03/30/24:  FINDINGS:   LUMBAR SPINE:   BONES: No evidence of acute fracture or traumatic malalignment. Grade 1 anterolisthesis of L4 is presumed chronic.   DISCS AND DEGENERATIVE CHANGES: Mild multilevel spondylosis with anterior osteophytosis. Disc space height loss is greatest at L5-S1 where it is moderate. Advanced facet arthropathy at L4-L5 and  L5-S1.   SOFT TISSUES: No acute abnormality.   IMPRESSION: 1. Grade 1 anterolisthesis of L4, presumed chronic. 2. Mild multilevel spondylosis with anterior osteophytosis and disc space height loss, greatest at L5-S1 where it is moderate. 3. Advanced facet arthropathy at L4-L5 and L5-S1.   Electronically signed by: Norman Gatlin MD 04/01/2024 12:02 AM EDT RP Workstation: [id number]  I have personally reviewed the images and agree with the above interpretation.  Assessment and Plan: Ms. Giovannini has constant LBP with left buttock pain x years, but worse in last month. She has numbness in left foot. No tingling or weakness.   She has known slip at L4-L5. She has  moderate DDD L4-S1.   She has seen improvement since starting PT.   Treatment options discussed with patient and following plan made:   - Continue with PT for lumbar spine.  - Can consider lumbar MRI and possible injections if pain gets worse. She declines for now.  - Follow up with me in 6 weeks and prn.   I spent a total of 30 minutes in face-to-face and non-face-to-face activities related to this patient's care today including review of outside records, review of imaging, review of symptoms, physical exam, discussion of differential diagnosis, discussion of treatment options, and documentation.   Thank you for involving me in the care of this patient.   Glade Boys PA-C Dept. of Neurosurgery

## 2024-04-16 NOTE — Patient Instructions (Signed)

## 2024-04-16 NOTE — Progress Notes (Signed)
 Subjective:    Patient ID: Lisa Ball, female    DOB: 06/10/1961, 63 y.o.   MRN: [id number]   Chief Complaint: medical management of chronic issues     HPI:  Lisa Ball is a 63 y.o. who identifies as a female who was assigned female at birth.   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   Comes in today for follow up of the following chronic medical issues:  1. Primary hypertension No c/o chest pain, sob or headache. Doe snot check blood pressure at home. BP Readings from Last 3 Encounters:  03/30/24 (!) 158/87  10/10/23 (!) 154/81  04/07/23 135/81     2. History of cerebrovascular accident (CVA) Has no permanent effects  3. Mixed hyperlipidemia Doe snot really watch  diet and does no dedicated exercise Lab Results  Component Value Date   CHOL 102 10/10/2023   HDL 30 (L) 10/10/2023   LDLCALC 50 10/10/2023   TRIG 118 10/10/2023   CHOLHDL 3.4 10/10/2023     4. Osteopenia of lumbar spine No weight bearing exercises. Last dexascan was done on 01/03/22. T score was -0.1 which was normal.  5. Recurrent major depressive disorder, in full remission ([name]) Has been on lexapro  for awhile. Says she is doing well.     04/16/2024   12:12 PM 10/10/2023   11:40 AM 05/02/2023    9:38 AM  Depression screen PHQ 2/9  Decreased Interest 0 0 0  Down, Depressed, Hopeless 0 0 0  PHQ - 2 Score 0 0 0  Altered sleeping 0  0  Tired, decreased energy 0  0  Change in appetite 0  0  Feeling bad or failure about yourself  0  0  Trouble concentrating 0  0  Moving slowly or fidgety/restless 0  0  Suicidal thoughts 0  0  PHQ-9 Score 0  0  Difficult doing work/chores Not difficult at all  Not difficult at all     6. Smoker Smokes over a pack a day. Low dose CT scan was done on 04/27/23, which showed no lung cancer findings.  7. Diabetes Fasting blood sugars are around 130-140 . She does not check her  blood sugars very often.  Lab Results  Component Value Date   HGBA1C 6.1 (H) 10/10/2023     8. BMI 29.0-29.9,adult Weight is down 4lbs  Wt Readings from Last 3 Encounters:  04/16/24 151 lb (68.5 kg)  03/30/24 153 lb 6.4 oz (69.6 kg)  10/10/23 155 lb (70.3 kg)   BMI Readings from Last 3 Encounters:  04/16/24 25.13 kg/m  03/30/24 25.53 kg/m  10/10/23 25.79 kg/m          New complaints: None today  Allergies  Allergen Reactions   Methotrexate Other (See Comments) and Shortness Of Breath    Lung problems per patient.   Methotrexate Derivatives Shortness Of Breath   Penicillins Other (See Comments)    Couldn't  move   Outpatient Encounter Medications as of 04/16/2024  Medication Sig   albuterol  (VENTOLIN  HFA) 108 (90 Base) MCG/ACT inhaler Inhale 2 puffs into the lungs every 6 (six) hours as needed for wheezing or shortness of breath.   aspirin EC 81 MG tablet Take 81 mg by mouth daily.   atorvastatin  (LIPITOR) 40 MG tablet Take 1 tablet (40 mg total) by mouth daily.   diclofenac Sodium (VOLTAREN) 1 % GEL Apply 2 g  topically 4 (four) times daily.   escitalopram  (LEXAPRO ) 20 MG tablet Take 1 tablet (20 mg total) by mouth daily.   fenofibrate  160 MG tablet Take 1 tablet (160 mg total) by mouth daily.   hydroxychloroquine (PLAQUENIL) 200 MG tablet Take 1 tablet by mouth daily.   lisinopril  (ZESTRIL ) 40 MG tablet Take 1 tablet (40 mg total) by mouth daily.   metFORMIN  (GLUCOPHAGE ) 1000 MG tablet Take 1 tablet (1,000 mg total) by mouth 2 (two) times daily with a meal.   ORENCIA CLICKJECT 125 MG/ML SOAJ Inject 125 mg into the skin.   potassium chloride  SA (KLOR-CON  M) 20 MEQ tablet Take 1 tablet (20 mEq total) by mouth daily.   Secukinumab (COSENTYX SENSOREADY PEN) 150 MG/ML SOAJ Inject the contents of 1 pen into the skin every 4 weeks   No facility-administered encounter medications on file as of 04/16/2024.    Past Surgical History:  Procedure Laterality Date   BREAST  CYST EXCISION Left    many years ago   CHOLECYSTECTOMY     TUBAL LIGATION      Family History  Problem Relation Age of Onset   Heart disease Mother    Cancer Father 63       lung cancer-died at age 32   Diabetes Sister    Seizures Maternal Grandmother    Heart disease Maternal Grandfather    Seizures Paternal Grandmother    Heart disease Paternal Grandfather    Breast cancer Paternal Aunt    Colon cancer Neg Hx    Esophageal cancer Neg Hx    Rectal cancer Neg Hx    Stomach cancer Neg Hx       Controlled substance contract: n/a     Review of Systems  Constitutional:  Negative for diaphoresis.  Eyes:  Negative for pain.  Respiratory:  Negative for shortness of breath.   Cardiovascular:  Negative for chest pain, palpitations and leg swelling.  Gastrointestinal:  Negative for abdominal pain.  Endocrine: Negative for polydipsia.  Skin:  Negative for rash.  Neurological:  Negative for dizziness, weakness and headaches.  Hematological:  Does not bruise/bleed easily.  All other systems reviewed and are negative.      Objective:   Physical Exam Vitals and nursing note reviewed.  Constitutional:      General: She is not in acute distress.    Appearance: Normal appearance. She is well-developed.  HENT:     Head: Normocephalic.     Right Ear: Tympanic membrane normal.     Left Ear: Tympanic membrane normal.     Nose: Nose normal.     Mouth/Throat:     Mouth: Mucous membranes are moist.  Eyes:     Pupils: Pupils are equal, round, and reactive to light.  Neck:     Vascular: No carotid bruit or JVD.  Cardiovascular:     Rate and Rhythm: Normal rate and regular rhythm.     Heart sounds: Normal heart sounds.  Pulmonary:     Effort: Pulmonary effort is normal. No respiratory distress.     Breath sounds: Normal breath sounds. No wheezing or rales.  Chest:     Chest wall: No tenderness.  Abdominal:     General: Bowel sounds are normal. There is no distension or  abdominal bruit.     Palpations: Abdomen is soft. There is no hepatomegaly, splenomegaly, mass or pulsatile mass.     Tenderness: There is no abdominal tenderness.  Musculoskeletal:  General: Normal range of motion.     Cervical back: Normal range of motion and neck supple.  Lymphadenopathy:     Cervical: No cervical adenopathy.  Skin:    General: Skin is warm and dry.  Neurological:     Mental Status: She is alert and oriented to person, place, and time.     Deep Tendon Reflexes: Reflexes are normal and symmetric.  Psychiatric:        Behavior: Behavior normal.        Thought Content: Thought content normal.        Judgment: Judgment normal.    BP 127/74   Pulse 71   Temp 97.7 F (36.5 C) (Temporal)   Ht 5' 5 (1.651 m)   Wt 151 lb (68.5 kg)   SpO2 95%   BMI 25.13 kg/m     Hgba1c 5.4%      Assessment & Plan:   Lisa Ball comes in today with chief complaint of medical management of chronic issues    Diagnosis and orders addressed:  1. Primary hypertension Low sodium diet Increase lisinopril  to 40 mg daily - CBC with Differential/Platelet - CMP14+EGFR - lisinopril  (ZESTRIL ) 40 MG tablet; Take 1 tablet (40 mg total) by mouth daily.  Dispense: 90 tablet; Refill: 1  2. Late effect of cerebrovascular accident (CVA)  3. Mixed hyperlipidemia Low fat diet - Lipid panel - atorvastatin  (LIPITOR) 40 MG tablet; Take 1 tablet (40 mg total) by mouth daily.  Dispense: 90 tablet; Refill: 1 - fenofibrate  160 MG tablet; Take 1 tablet (160 mg total) by mouth daily.  Dispense: 90 tablet; Refill: 1  4. Osteopenia of lumbar spine Weight bearing exercises  5. Recurrent major depressive disorder, in full remission ([name]) Stress management - escitalopram  (LEXAPRO ) 20 MG tablet; Take 1 tablet (20 mg total) by mouth daily.  Dispense: 90 tablet; Refill: 1  6. Diabetes mellitus treated with oral medication ([name]) Continue to watch carbs in diet - Bayer DCA Hb A1c  Waived - metFORMIN  (GLUCOPHAGE ) 1000 MG tablet; Take 1 tablet (1,000 mg total) by mouth 2 (two) times daily with a meal.  Dispense: 180 tablet; Refill: 1  7. Smoker Smoking cessation encourage   8. BMI 29.0-29.9,adult Discussed diet and exercise for person with BMI >25 Will recheck weight in 3-6 months    Labs pending Health Maintenance reviewed Diet and exercise encouraged  Follow up plan: 6 months   Mary-Margaret Gladis, FNP

## 2024-04-17 LAB — CBC WITH DIFFERENTIAL/PLATELET
Basophils Absolute: 0.1 x10E3/uL (ref 0.0–0.2)
Basos: 1 %
EOS (ABSOLUTE): 0.3 x10E3/uL (ref 0.0–0.4)
Eos: 3 %
Hematocrit: 44.4 % (ref 34.0–46.6)
Hemoglobin: 14.1 g/dL (ref 11.1–15.9)
Immature Grans (Abs): 0 x10E3/uL (ref 0.0–0.1)
Immature Granulocytes: 0 %
Lymphocytes Absolute: 2 x10E3/uL (ref 0.7–3.1)
Lymphs: 24 %
MCH: 29.5 pg (ref 26.6–33.0)
MCHC: 31.8 g/dL (ref 31.5–35.7)
MCV: 93 fL (ref 79–97)
Monocytes Absolute: 0.9 x10E3/uL (ref 0.1–0.9)
Monocytes: 11 %
Neutrophils Absolute: 5.1 x10E3/uL (ref 1.4–7.0)
Neutrophils: 61 %
Platelets: 334 x10E3/uL (ref 150–450)
RBC: 4.78 x10E6/uL (ref 3.77–5.28)
RDW: 13.5 % (ref 11.7–15.4)
WBC: 8.3 x10E3/uL (ref 3.4–10.8)

## 2024-04-17 LAB — CMP14+EGFR
ALT: 17 IU/L (ref 0–32)
AST: 20 IU/L (ref 0–40)
Albumin: 4.2 g/dL (ref 3.9–4.9)
Alkaline Phosphatase: 63 IU/L (ref 44–121)
BUN/Creatinine Ratio: 17 (ref 12–28)
BUN: 14 mg/dL (ref 8–27)
Bilirubin Total: 0.4 mg/dL (ref 0.0–1.2)
CO2: 24 mmol/L (ref 20–29)
Calcium: 9.9 mg/dL (ref 8.7–10.3)
Chloride: 101 mmol/L (ref 96–106)
Creatinine, Ser: 0.82 mg/dL (ref 0.57–1.00)
Globulin, Total: 2.5 g/dL (ref 1.5–4.5)
Glucose: 81 mg/dL (ref 70–99)
Potassium: 4.1 mmol/L (ref 3.5–5.2)
Sodium: 136 mmol/L (ref 134–144)
Total Protein: 6.7 g/dL (ref 6.0–8.5)
eGFR: 80 mL/min/1.73 (ref >59)

## 2024-04-17 LAB — LIPID PANEL
Chol/HDL Ratio: 3.3 ratio (ref 0.0–4.4)
Cholesterol, Total: 99 mg/dL — ABNORMAL LOW (ref 100–199)
HDL: 30 mg/dL — ABNORMAL LOW (ref >39)
LDL Chol Calc (NIH): 50 mg/dL (ref 0–99)
Triglycerides: 100 mg/dL (ref 0–149)
VLDL Cholesterol Cal: 19 mg/dL (ref 5–40)

## 2024-04-17 LAB — VITAMIN B12: Vitamin B-12: 160 pg/mL — ABNORMAL LOW (ref 232–1245)

## 2024-04-19 ENCOUNTER — Ambulatory Visit: Payer: Self-pay | Admitting: Nurse Practitioner

## 2024-04-21 ENCOUNTER — Ambulatory Visit: Attending: Family Medicine

## 2024-04-21 DIAGNOSIS — M79605 Pain in left leg: Secondary | ICD-10-CM | POA: Diagnosis not present

## 2024-04-21 DIAGNOSIS — M5459 Other low back pain: Secondary | ICD-10-CM | POA: Insufficient documentation

## 2024-04-21 DIAGNOSIS — M6281 Muscle weakness (generalized): Secondary | ICD-10-CM | POA: Insufficient documentation

## 2024-04-21 NOTE — Therapy (Signed)
 OUTPATIENT PHYSICAL THERAPY THORACOLUMBAR TREATMENT   Patient Name: Lisa Ball MRN: [id number] DOB:28-Apr-1961, 63 y.o., female Today's Date: 04/21/2024  END OF SESSION:  PT End of Session - 04/21/24 1428     Visit Number 4    Number of Visits 12    Date for PT Re-Evaluation 06/11/24    PT Start Time 1424    PT Stop Time 1512    PT Time Calculation (min) 48 min    Activity Tolerance Patient tolerated treatment well    Behavior During Therapy [facility] for tasks assessed/performed          Past Medical History:  Diagnosis Date   Anxiety    Colon polyps    Depression    Hyperlipidemia    Hypertension    Psoriatic arthritis ([name])    Pulmonary fibrosis ([name])    RA (rheumatoid arthritis) ([name])    Past Surgical History:  Procedure Laterality Date   BREAST CYST EXCISION Left    many years ago   CHOLECYSTECTOMY     TUBAL LIGATION     Patient Active Problem List   Diagnosis Date Noted   Seropositive rheumatoid arthritis of multiple sites ([name]) 10/09/2023   Diabetes mellitus treated with oral medication ([name]) 04/07/2023   History of CVA (cerebrovascular accident) 10/01/2021   Personal history of tobacco use, presenting hazards to health 06/05/2021   Osteoarthritis of multiple joints 10/27/2018   Psoriasis 12/23/2017   BMI 29.0-29.9,adult 06/20/2016   Smoker 09/19/2014   Depression 03/22/2013   Hypertension 03/22/2013   Osteopenia 03/22/2013   Hyperlipidemia 03/22/2013   Psoriatic arthritis ([name]) 03/22/2013    PCP: Gladis Ronal Quant, FNP  REFERRING PROVIDER: Severa Rock HERO, FNP  REFERRING DIAG: Acute bilateral low back pain with left-sided sciatica   Rationale for Evaluation and Treatment: Rehabilitation  THERAPY DIAG:  Pain in left leg  Muscle weakness (generalized)  Other low back pain  ONSET DATE: One month ago   SUBJECTIVE:                                                                                                                                                                                            SUBJECTIVE STATEMENT: Pt denies any pain today.  Patient states they are doing good and is feeling better since starting physical therapy.  PERTINENT HISTORY:  DM, HTN, Osteopenia, RA, OA, depression, anxiety, smoker  PAIN:  Are you having pain? No  PRECAUTIONS: None  RED FLAGS: None   WEIGHT BEARING RESTRICTIONS: No  FALLS:  Has patient fallen in last 6 months? No  LIVING ENVIRONMENT: Lives with: lives with their family, care  taker to cousin Lives in: House/apartment Stairs: No Has following equipment at home: shower chair and Grab bars, ramp  OCCUPATION: caretaker   PLOF: Independent with community mobility without device  PATIENT GOALS: reduce pain, get back to care taking, cousin weighs 200 pounds and needs to pull him and turn him.  NEXT MD VISIT: 04/12/2024  OBJECTIVE:  Note: Objective measures were completed at Evaluation unless otherwise noted.  DIAGNOSTIC FINDINGS:   03/30/24 lumbar x-ray 1. Grade 1 anterolisthesis of L4, presumed chronic. 2. Mild multilevel spondylosis with anterior osteophytosis and disc space height loss, greatest at L5-S1 where it is moderate. 3. Advanced facet arthropathy at L4-L5 and L5-S1.  PATIENT SURVEYS:  Modified Oswestry Disability Index: 10/50  COGNITION: Overall cognitive status: Within functional limits for tasks assessed     SENSATION: Diminished L2-S1 dermatomes on L compared to R   POSTURE: rounded shoulders, forward head, and decreased lumbar lordosis  PALPATION: Muscles: TTP along Lumbar paraspinals L3-L5, L Piriformis, L QL, L glute med.   Joint: Hypomobile L2-L5, PSIS was TTP  LUMBAR ROM:   AROM eval  Flexion 78  Extension 10  Right lateral flexion To patella  Left lateral flexion To patella  Right rotation 25% limited  Left rotation 25% limited   (Blank rows = not tested)   LOWER EXTREMITY MMT:    MMT Right eval Left eval  Hip  flexion 4- 3+ painful  Hip extension    Hip abduction    Hip adduction    Hip internal rotation    Hip external rotation    Knee flexion 4 4-  Knee extension 4 4  Ankle dorsiflexion 4- 3+  Ankle plantarflexion    Ankle inversion    Ankle eversion     (Blank rows = not tested)  LUMBAR SPECIAL TESTS:  Slump test: Positive (L leg) R Leg (painful on L side) Repeated Motions: Forward flexion 10x, no exacerbation of symptoms   GAIT: Distance walked: To be evaluated next visit Assistive device utilized: None Level of assistance: Complete Independence Comments: Patient ambulates with decreased lordosis, decreased stance time on L.  TREATMENT DATE:   04/21/24                                  EXERCISE LOG  Exercise Repetitions and Resistance Comments  Treadmill 1.0 mph x 15 mins   Rockerboard 4 mins   Lunges 14 box x 3 mins   Side-Stepping Airex x 3 mins   Tandem Gait Airex x 3 mins   Pull down Red 2 sets of 15 reps   Rows Red 2 sets of 15 reps   LAQs 2# 2 sets of 15 reps   Seated Marches 2# 2 sets of 15 reps    Blank cell = exercise not performed today   04/14/2024                                    EXERCISE LOG  Exercise Repetitions and Resistance Comments  TM 1 MPH x 13 min   BTK's 1x60 s   Lumbar rotation 2x60s   Open Books 1x8 ea side   Prone press ups 1x15 On elbows  Seated Chops  2x10 ea side 2# med ball    Glute bridges 1x10   Paloff press 2x15 w/ red tb    Blank cell =  exercise not performed today  Manual Therapy: STM L2-L5 L parspinals,  L Piriformis  04/08/2024                                     EXERCISE LOG  Exercise Repetitions and Resistance Comments  TM 1. x 8 minutes   Ball roll outs 1x83minutes ea  Forward, both sides  Paloff press 2x10 w/ red tb   Seated Marching 3x45s w/ 2#   Sciatic Nerve glides 2x30s each leg Patient performing at home correctly  Seated Chops 2x10 ea side    Blank cell = exercise not performed today  Manual  Therapy: STM L2-L5 L parspinals,  L Piriformis  04/05/24  Manual Therapy: L2-L5 paraspinals, L QL, and L piriformis                                                                                                                               PATIENT EDUCATION:  Education details: positioning, core exercises and HEP. Person educated: Patient Education method: Explanation, Demonstration, and Tactile cues Education comprehension: verbalized understanding  HOME EXERCISE PROGRAM: Sciatic Nerve Flossing 2x/day 2 minutes total, Piriformis tennis ball massage 2 min/day or to tolerance, standing flexion repeated motions 2 min/day.  ASSESSMENT:  CLINICAL IMPRESSION: Pt arrives for today's treatment session denying any pain.  Pt states that her back is feeling better since beginning therapy.  Pt able to tolerate increased time on the treadmill today without discomfort.  Pt instructed in several new standing and seated exercises today with good results. Pt requiring BUE support for all standing airex exercises today.  Pt requiring min cues for proper technique with all newly added exercises.  Pt denied any pain at completion of today's treatment session.  OBJECTIVE IMPAIRMENTS: Abnormal gait, decreased activity tolerance, decreased mobility, difficulty walking, decreased ROM, decreased strength, increased muscle spasms, impaired sensation, and pain.   ACTIVITY LIMITATIONS: carrying, lifting, bending, standing, squatting, stairs, and transfers  PARTICIPATION LIMITATIONS: community activity and occupation  PERSONAL FACTORS: Age, Fitness, and 1-2 comorbidities: DM, RA are also affecting patient's functional outcome.   REHAB POTENTIAL: Good  CLINICAL DECISION MAKING: Evolving/moderate complexity  EVALUATION COMPLEXITY: Moderate   GOALS: Goals reviewed with patient? No  SHORT TERM GOALS: Target date: 04/26/24  Patient will be compliant with HEP Baseline: Goal status: INITIAL  2.  Patient  will increase lumbar extension AROM to 20 degrees in order to improve mobility for walking with proper body mechanics Baseline:  Goal status: INITIAL  3.  Patient will increase hip flexion strength to 4/5 bilaterally in order to improve body mechanics while lifting for work. Baseline:  Goal status: INITIAL    LONG TERM GOALS: Target date: 05/17/24  Patient will be able to pull 40 pounds with proper body mechanics and no pain in order to return to caretaking.  Baseline:  Goal status: INITIAL  2.  Patient will be able to maintain lumbar spine in neutral while lifting 20 pounds from ground, without pain, in order to improve function with ADL's Baseline:  Goal status: INITIAL  3.  Patient will be able to lay supine without increased pain in order to sleep through the night comfortably without pain. Baseline:  Goal status: INITIAL  4.  Patient will improve ODI by 5 points in order to demonstrate improved tolerance with ADL's. Baseline:  Goal status: INITIAL    PLAN:  PT FREQUENCY: 2x/week  PT DURATION: 6 weeks  PLANNED INTERVENTIONS: 97164- PT Re-evaluation, 97750- Physical Performance Testing, 97110-Therapeutic exercises, 97530- Therapeutic activity, [id number]- Neuromuscular re-education, 97535- Self Care, [id number]- Manual therapy, [id number]- Gait training, [phone]- Electrical stimulation (unattended), [phone]- Traction (mechanical), 20560 (1-2 muscles), 20561 (3+ muscles)- Dry Needling, Patient/Family education, Joint mobilization, Spinal mobilization, Cryotherapy, and Moist heat.  PLAN FOR NEXT SESSION: Plan perform manual therapy, gentle lumbar extension exercises, core strengthening and LE strengthening.   Delon DELENA Gosling, PTA 04/21/2024, 3:15 PM

## 2024-04-22 ENCOUNTER — Ambulatory Visit: Admitting: Orthopedic Surgery

## 2024-04-22 ENCOUNTER — Encounter: Payer: Self-pay | Admitting: Orthopedic Surgery

## 2024-04-22 VITALS — BP 136/80 | Ht 63.0 in | Wt 151.4 lb

## 2024-04-22 DIAGNOSIS — M4316 Spondylolisthesis, lumbar region: Secondary | ICD-10-CM

## 2024-04-22 DIAGNOSIS — M5136 Other intervertebral disc degeneration, lumbar region with discogenic back pain only: Secondary | ICD-10-CM

## 2024-04-22 DIAGNOSIS — M47816 Spondylosis without myelopathy or radiculopathy, lumbar region: Secondary | ICD-10-CM | POA: Diagnosis not present

## 2024-04-22 NOTE — Patient Instructions (Signed)
 It was so nice to see you today. Thank you so much for coming in.    Your lumbar xrays showed some wear and tear (arthritis) and I think this may be causing your pain.   Continue with PT for your back at [facility]. Hopefully you will continue to see improvement.    If pain gets worse, we can consider getting an MRI of your back and then possible lumbar injections.   I will see you back in 6 weeks. Please do not hesitate to call if you have any questions or concerns. You can also message me in MyChart.   Glade Boys PA-C [phone]     The physicians and staff at [facility] at [facility] are committed to providing excellent care. You may receive a survey asking for feedback about your experience at our office. We value you your feedback and appreciate you taking the time to to fill it out. The [facility] leadership team is also available to discuss your experience in person, feel free to contact us  [phone].

## 2024-04-27 ENCOUNTER — Ambulatory Visit

## 2024-04-27 DIAGNOSIS — M79605 Pain in left leg: Secondary | ICD-10-CM

## 2024-04-27 DIAGNOSIS — M6281 Muscle weakness (generalized): Secondary | ICD-10-CM

## 2024-04-27 DIAGNOSIS — M5459 Other low back pain: Secondary | ICD-10-CM | POA: Diagnosis not present

## 2024-04-27 NOTE — Therapy (Signed)
 OUTPATIENT PHYSICAL THERAPY THORACOLUMBAR TREATMENT   Patient Name: Lisa Ball MRN: [id number] DOB:[birth year], 63 y.o., female Today's Date: 04/27/2024  END OF SESSION:  PT End of Session - 04/27/24 1425     Visit Number 5    Number of Visits 12    Date for PT Re-Evaluation 06/11/24    PT Start Time 1425    PT Stop Time 1512    PT Time Calculation (min) 47 min    Activity Tolerance Patient tolerated treatment well    Behavior During Therapy WFL for tasks assessed/performed          Past Medical History:  Diagnosis Date   Anxiety    Colon polyps    Depression    Hyperlipidemia    Hypertension    Psoriatic arthritis ([name])    Pulmonary fibrosis ([name])    RA (rheumatoid arthritis) ([name])    Past Surgical History:  Procedure Laterality Date   BREAST CYST EXCISION Left    many years ago   CHOLECYSTECTOMY     TUBAL LIGATION     Patient Active Problem List   Diagnosis Date Noted   Seropositive rheumatoid arthritis of multiple sites ([name]) 10/09/2023   Diabetes mellitus treated with oral medication ([name]) 04/07/2023   History of CVA (cerebrovascular accident) 10/01/2021   Personal history of tobacco use, presenting hazards to health 06/05/2021   Osteoarthritis of multiple joints 10/27/2018   Psoriasis 12/23/2017   BMI 29.0-29.9,adult 06/20/2016   Smoker 09/19/2014   Depression 03/22/2013   Hypertension 03/22/2013   Osteopenia 03/22/2013   Hyperlipidemia 03/22/2013   Psoriatic arthritis ([name]) 03/22/2013    PCP: Gladis Ronal Quant, FNP  REFERRING PROVIDER: Severa Rock HERO, FNP  REFERRING DIAG: Acute bilateral low back pain with left-sided sciatica   Rationale for Evaluation and Treatment: Rehabilitation  THERAPY DIAG:  Pain in left leg  Muscle weakness (generalized)  Other low back pain  ONSET DATE: One month ago   SUBJECTIVE:                                                                                                                                                                                            SUBJECTIVE STATEMENT: Pt states they are doing well today and are feeling better.   PERTINENT HISTORY:  DM, HTN, Osteopenia, RA, OA, depression, anxiety, smoker  PAIN:  Are you having pain? No  PRECAUTIONS: None  RED FLAGS: None   WEIGHT BEARING RESTRICTIONS: No  FALLS:  Has patient fallen in last 6 months? No  LIVING ENVIRONMENT: Lives with: lives with their family, care taker to cousin Lives in: House/apartment Stairs: No  Has following equipment at home: shower chair and Grab bars, ramp  OCCUPATION: caretaker   PLOF: Independent with community mobility without device  PATIENT GOALS: reduce pain, get back to care taking, cousin weighs 200 pounds and needs to pull him and turn him.  NEXT MD VISIT: 04/12/2024  OBJECTIVE:  Note: Objective measures were completed at Evaluation unless otherwise noted.  DIAGNOSTIC FINDINGS:   03/30/24 lumbar x-ray 1. Grade 1 anterolisthesis of L4, presumed chronic. 2. Mild multilevel spondylosis with anterior osteophytosis and disc space height loss, greatest at L5-S1 where it is moderate. 3. Advanced facet arthropathy at L4-L5 and L5-S1.  PATIENT SURVEYS:  Modified Oswestry Disability Index: 10/50  COGNITION: Overall cognitive status: Within functional limits for tasks assessed     SENSATION: Diminished L2-S1 dermatomes on L compared to R   POSTURE: rounded shoulders, forward head, and decreased lumbar lordosis  PALPATION: Muscles: TTP along Lumbar paraspinals L3-L5, L Piriformis, L QL, L glute med.   Joint: Hypomobile L2-L5, PSIS was TTP  LUMBAR ROM:   AROM eval  Flexion 78  Extension 10  Right lateral flexion To patella  Left lateral flexion To patella  Right rotation 25% limited  Left rotation 25% limited   (Blank rows = not tested)   LOWER EXTREMITY MMT:    MMT Right eval Left eval  Hip flexion 4- 3+ painful  Hip extension    Hip abduction     Hip adduction    Hip internal rotation    Hip external rotation    Knee flexion 4 4-  Knee extension 4 4  Ankle dorsiflexion 4- 3+  Ankle plantarflexion    Ankle inversion    Ankle eversion     (Blank rows = not tested)  LUMBAR SPECIAL TESTS:  Slump test: Positive (L leg) R Leg (painful on L side) Repeated Motions: Forward flexion 10x, no exacerbation of symptoms   GAIT: Distance walked: To be evaluated next visit Assistive device utilized: None Level of assistance: Complete Independence Comments: Patient ambulates with decreased lordosis, decreased stance time on L.  TREATMENT DATE:    04/27/2024                                   EXERCISE LOG  Exercise Repetitions and Resistance Comments  Treadmill  x 15 mins        Seated Marches 3# 3x15    LAQs 3# 2x20    HS curl  2x15 red tb    Toe raises/calf raises  2x30s each on airex   Prone Press-ups  1x12    Modified elbow plank 1x10, 1x5 (opp arm-leg) Cue to lift knees up  Rockerboard 3 mins     Blank cell = exercise not performed today   04/21/24                                  EXERCISE LOG  Exercise Repetitions and Resistance Comments  Treadmill 1.0 mph x 15 mins   Rockerboard 4 mins   Lunges 14 box x 3 mins   Side-Stepping Airex x 3 mins   Tandem Gait Airex x 3 mins   Pull down Red 2 sets of 15 reps   Rows Red 2 sets of 15 reps   LAQs 2# 2 sets of 15 reps   Seated Marches 2# 2 sets of 15 reps  Blank cell = exercise not performed today   04/14/2024                                    EXERCISE LOG  Exercise Repetitions and Resistance Comments  TM 1 MPH x 13 min   BTK's 1x60 s   Lumbar rotation 2x60s   Open Books 1x8 ea side   Prone press ups 1x15 On elbows  Seated Chops  2x10 ea side 2# med ball    Glute bridges 1x10   Paloff press 2x15 w/ red tb    Blank cell = exercise not performed today  Manual Therapy: STM L2-L5 L paraspinals,  L Piriformis                                                                                         PATIENT EDUCATION:  Education details: positioning, core exercises and HEP. Person educated: Patient Education method: Explanation, Demonstration, and Tactile cues Education comprehension: verbalized understanding  HOME EXERCISE PROGRAM: Sciatic Nerve Flossing 2x/day 2 minutes total, Piriformis tennis ball massage 2 min/day or to tolerance, standing flexion repeated motions 2 min/day.  ASSESSMENT:  CLINICAL IMPRESSION:  Patient presents for visit 5 doing well. Patient reports the pain is no longer going down into the legs and was educated that this is a great sign of progress. Progressed strengthening today with increase resistance and new exercises to include the hamstrings and ankle musculature. Additionally, progressed posterior chain activation with modified planks and prone press ups. Patient reported fatigue towards end of session but denied any pain. Plan to increase core and lumbar strengthening in future sessions to tolerance. Patient would benefit from continued skilled physical therapy to address impairments below.  OBJECTIVE IMPAIRMENTS: Abnormal gait, decreased activity tolerance, decreased mobility, difficulty walking, decreased ROM, decreased strength, increased muscle spasms, impaired sensation, and pain.   ACTIVITY LIMITATIONS: carrying, lifting, bending, standing, squatting, stairs, and transfers  PARTICIPATION LIMITATIONS: community activity and occupation  PERSONAL FACTORS: Age, Fitness, and 1-2 comorbidities: DM, RA are also affecting patient's functional outcome.   REHAB POTENTIAL: Good  CLINICAL DECISION MAKING: Evolving/moderate complexity  EVALUATION COMPLEXITY: Moderate   GOALS: Goals reviewed with patient? No  SHORT TERM GOALS: Target date: 04/26/24  Patient will be compliant with HEP Baseline: Goal status: INITIAL  2.  Patient will increase lumbar extension AROM to 20 degrees in order to improve mobility for  walking with proper body mechanics Baseline:  Goal status: INITIAL  3.  Patient will increase hip flexion strength to 4/5 bilaterally in order to improve body mechanics while lifting for work. Baseline:  Goal status: INITIAL    LONG TERM GOALS: Target date: 05/17/24  Patient will be able to pull 40 pounds with proper body mechanics and no pain in order to return to caretaking.  Baseline:  Goal status: INITIAL  2.  Patient will be able to maintain lumbar spine in neutral while lifting 20 pounds from ground, without pain, in order to improve function with ADL's Baseline:  Goal status: INITIAL  3.  Patient will be able to lay  supine without increased pain in order to sleep through the night comfortably without pain. Baseline:  Goal status: INITIAL  4.  Patient will improve ODI by 5 points in order to demonstrate improved tolerance with ADL's. Baseline:  Goal status: INITIAL    PLAN:  PT FREQUENCY: 2x/week  PT DURATION: 6 weeks  PLANNED INTERVENTIONS: 97164- PT Re-evaluation, 97750- Physical Performance Testing, 97110-Therapeutic exercises, 97530- Therapeutic activity, [id number]- Neuromuscular re-education, 97535- Self Care, 02859- Manual therapy, [id number]- Gait training, [phone]- Electrical stimulation (unattended), [phone]- Traction (mechanical), 20560 (1-2 muscles), 20561 (3+ muscles)- Dry Needling, Patient/Family education, Joint mobilization, Spinal mobilization, Cryotherapy, and Moist heat.  PLAN FOR NEXT SESSION: Plan perform manual therapy, gentle lumbar extension exercises, core strengthening and LE strengthening.   Estefana Jude, Student-PT 04/27/2024, 4:17 PM

## 2024-04-29 ENCOUNTER — Ambulatory Visit

## 2024-04-29 DIAGNOSIS — M6281 Muscle weakness (generalized): Secondary | ICD-10-CM | POA: Diagnosis not present

## 2024-04-29 DIAGNOSIS — M79605 Pain in left leg: Secondary | ICD-10-CM | POA: Diagnosis not present

## 2024-04-29 DIAGNOSIS — M5459 Other low back pain: Secondary | ICD-10-CM

## 2024-04-29 NOTE — Therapy (Signed)
 OUTPATIENT PHYSICAL THERAPY THORACOLUMBAR TREATMENT   Patient Name: Lisa Ball MRN: [id number] DOB:14-Jun-1961, 63 y.o., female Today's Date: 04/29/2024  END OF SESSION:  PT End of Session - 04/29/24 1514     Visit Number 6    Number of Visits 12    Date for PT Re-Evaluation 06/11/24    PT Start Time 1515    PT Stop Time 1614    PT Time Calculation (min) 59 min    Activity Tolerance Patient tolerated treatment well    Behavior During Therapy [facility] for tasks assessed/performed          Past Medical History:  Diagnosis Date   Anxiety    Colon polyps    Depression    Hyperlipidemia    Hypertension    Psoriatic arthritis ([name])    Pulmonary fibrosis ([name])    RA (rheumatoid arthritis) ([name])    Past Surgical History:  Procedure Laterality Date   BREAST CYST EXCISION Left    many years ago   CHOLECYSTECTOMY     TUBAL LIGATION     Patient Active Problem List   Diagnosis Date Noted   Seropositive rheumatoid arthritis of multiple sites ([name]) 10/09/2023   Diabetes mellitus treated with oral medication ([name]) 04/07/2023   History of CVA (cerebrovascular accident) 10/01/2021   Personal history of tobacco use, presenting hazards to health 06/05/2021   Osteoarthritis of multiple joints 10/27/2018   Psoriasis 12/23/2017   BMI 29.0-29.9,adult 06/20/2016   Smoker 09/19/2014   Depression 03/22/2013   Hypertension 03/22/2013   Osteopenia 03/22/2013   Hyperlipidemia 03/22/2013   Psoriatic arthritis ([name]) 03/22/2013    PCP: Gladis Ronal Quant, FNP  REFERRING PROVIDER: Severa Rock HERO, FNP  REFERRING DIAG: Acute bilateral low back pain with left-sided sciatica   Rationale for Evaluation and Treatment: Rehabilitation  THERAPY DIAG:  Pain in left leg  Muscle weakness (generalized)  Other low back pain  ONSET DATE: One month ago   SUBJECTIVE:                                                                                                                                                                                            SUBJECTIVE STATEMENT: Pt states they are doing well today just having some pain/soreness in the low back.   PERTINENT HISTORY:  DM, HTN, Osteopenia, RA, OA, depression, anxiety, smoker  PAIN:  Are you having pain? No  PRECAUTIONS: None  RED FLAGS: None   WEIGHT BEARING RESTRICTIONS: No  FALLS:  Has patient fallen in last 6 months? No  LIVING ENVIRONMENT: Lives with: lives with their family, care taker to cousin Lives  in: House/apartment Stairs: No Has following equipment at home: shower chair and Grab bars, ramp  OCCUPATION: caretaker   PLOF: Independent with community mobility without device  PATIENT GOALS: reduce pain, get back to care taking, cousin weighs 200 pounds and needs to pull him and turn him.  NEXT MD VISIT: 04/12/2024  OBJECTIVE:  Note: Objective measures were completed at Evaluation unless otherwise noted.  DIAGNOSTIC FINDINGS:   03/30/24 lumbar x-ray 1. Grade 1 anterolisthesis of L4, presumed chronic. 2. Mild multilevel spondylosis with anterior osteophytosis and disc space height loss, greatest at L5-S1 where it is moderate. 3. Advanced facet arthropathy at L4-L5 and L5-S1.  PATIENT SURVEYS:  Modified Oswestry Disability Index: 10/50  COGNITION: Overall cognitive status: Within functional limits for tasks assessed     SENSATION: Diminished L2-S1 dermatomes on L compared to R   POSTURE: rounded shoulders, forward head, and decreased lumbar lordosis  PALPATION: Muscles: TTP along Lumbar paraspinals L3-L5, L Piriformis, L QL, L glute med.   Joint: Hypomobile L2-L5, PSIS was TTP  LUMBAR ROM:   AROM eval  Flexion 78  Extension 10  Right lateral flexion To patella  Left lateral flexion To patella  Right rotation 25% limited  Left rotation 25% limited   (Blank rows = not tested)   LOWER EXTREMITY MMT:    MMT Right eval Left eval  Hip flexion 4- 3+ painful  Hip  extension    Hip abduction    Hip adduction    Hip internal rotation    Hip external rotation    Knee flexion 4 4-  Knee extension 4 4  Ankle dorsiflexion 4- 3+  Ankle plantarflexion    Ankle inversion    Ankle eversion     (Blank rows = not tested)  LUMBAR SPECIAL TESTS:  Slump test: Positive (L leg) R Leg (painful on L side) Repeated Motions: Forward flexion 10x, no exacerbation of symptoms   GAIT: Distance walked: To be evaluated next visit Assistive device utilized: None Level of assistance: Complete Independence Comments: Patient ambulates with decreased lordosis, decreased stance time on L.  TREATMENT DATE:  04/29/2024                                    EXERCISE LOG  Exercise Repetitions and Resistance Comments  Nustep L3x15 min   Prone press-ups 2x10  2nd set with knee raises  Seated Marches 4# 2x30s   LAQ 4# 2x30s   Paloff press 2x15 blue tb    Open book supine stretch 5x ea side with 10s hold   Glute bridges 2x10   BTK 2 min   Lumbar rotation w/ball  2 min   Around the worlds 2x45s Dumbell around stomach/back   Blank cell = exercise not performed today  IFC at 80-150Hz  on 40% scan x 15 minutes; no adverse reaction to today's modalities  04/27/2024                                   EXERCISE LOG  Exercise Repetitions and Resistance Comments  Treadmill  x 15 mins        Seated Marches 3# 3x15    LAQs 3# 2x20    HS curl  2x15 red tb    Toe raises/calf raises  2x30s each on airex   Prone Press-ups  1x12  Modified elbow plank 1x10, 1x5 (opp arm-leg) Cue to lift knees up  Rockerboard 3 mins     Blank cell = exercise not performed today    04/21/24                                  EXERCISE LOG  Exercise Repetitions and Resistance Comments  Treadmill 1.0 mph x 15 mins   Rockerboard 4 mins   Lunges 14 box x 3 mins   Side-Stepping Airex x 3 mins   Tandem Gait Airex x 3 mins   Pull down Red 2 sets of 15 reps   Rows Red 2 sets of 15 reps   LAQs 2#  2 sets of 15 reps   Seated Marches 2# 2 sets of 15 reps    Blank cell = exercise not performed today    PATIENT EDUCATION:  Education details: positioning, core exercises and HEP. Person educated: Patient Education method: Explanation, Demonstration, and Tactile cues Education comprehension: verbalized understanding  HOME EXERCISE PROGRAM: Sciatic Nerve Flossing 2x/day 2 minutes total, Piriformis tennis ball massage 2 min/day or to tolerance, standing flexion repeated motions 2 min/day.  ASSESSMENT:  CLINICAL IMPRESSION:  Patient presents to clinic today feeling good, but noted some low back pain. Patient progressed with core and lumbar strengthening activities including advancing prone press-ups to modified supermans. Patient also progressed with increased resistance during LE strength training which was performed well. Patient expressed muscle fatigue throughout lumbar exercises today. E-stim tried for the first time today for pain modulation which patient tolerated well. Patient would benefit from continued skilled physical therapy to address impairments below.  OBJECTIVE IMPAIRMENTS: Abnormal gait, decreased activity tolerance, decreased mobility, difficulty walking, decreased ROM, decreased strength, increased muscle spasms, impaired sensation, and pain.   ACTIVITY LIMITATIONS: carrying, lifting, bending, standing, squatting, stairs, and transfers  PARTICIPATION LIMITATIONS: community activity and occupation  PERSONAL FACTORS: Age, Fitness, and 1-2 comorbidities: DM, RA are also affecting patient's functional outcome.   REHAB POTENTIAL: Good  CLINICAL DECISION MAKING: Evolving/moderate complexity  EVALUATION COMPLEXITY: Moderate   GOALS: Goals reviewed with patient? No  SHORT TERM GOALS: Target date: 04/26/24  Patient will be compliant with HEP Baseline: Goal status: INITIAL  2.  Patient will increase lumbar extension AROM to 20 degrees in order to improve mobility  for walking with proper body mechanics Baseline:  Goal status: INITIAL  3.  Patient will increase hip flexion strength to 4/5 bilaterally in order to improve body mechanics while lifting for work. Baseline:  Goal status: INITIAL    LONG TERM GOALS: Target date: 05/17/24  Patient will be able to pull 40 pounds with proper body mechanics and no pain in order to return to caretaking.  Baseline:  Goal status: INITIAL  2.  Patient will be able to maintain lumbar spine in neutral while lifting 20 pounds from ground, without pain, in order to improve function with ADL's Baseline:  Goal status: INITIAL  3.  Patient will be able to lay supine without increased pain in order to sleep through the night comfortably without pain. Baseline:  Goal status: INITIAL  4.  Patient will improve ODI by 5 points in order to demonstrate improved tolerance with ADL's. Baseline:  Goal status: INITIAL    PLAN:  PT FREQUENCY: 2x/week  PT DURATION: 6 weeks  PLANNED INTERVENTIONS: 97164- PT Re-evaluation, 97750- Physical Performance Testing, 97110-Therapeutic exercises, 97530- Therapeutic activity, [id number]- Neuromuscular re-education, 97535- Self  Care, [id number]- Manual therapy, [phone]- Gait training, [phone]- Electrical stimulation (unattended), [phone]- Traction (mechanical), [phone] (1-2 muscles), [id number] (3+ muscles)- Dry Needling, Patient/Family education, Joint mobilization, Spinal mobilization, Cryotherapy, and Moist heat.  PLAN FOR NEXT SESSION: Plan perform manual therapy, gentle lumbar extension exercises, core strengthening and LE strengthening.   Estefana Jude, Student-PT 04/29/2024, 4:37 PM

## 2024-05-03 ENCOUNTER — Ambulatory Visit: Payer: Medicare Other

## 2024-05-03 DIAGNOSIS — Z13820 Encounter for screening for osteoporosis: Secondary | ICD-10-CM

## 2024-05-03 DIAGNOSIS — Z0000 Encounter for general adult medical examination without abnormal findings: Secondary | ICD-10-CM

## 2024-05-03 DIAGNOSIS — Z122 Encounter for screening for malignant neoplasm of respiratory organs: Secondary | ICD-10-CM

## 2024-05-03 DIAGNOSIS — Z1211 Encounter for screening for malignant neoplasm of colon: Secondary | ICD-10-CM

## 2024-05-03 NOTE — Patient Instructions (Signed)
 Lisa Ball , Thank you for taking time out of your busy schedule to complete your Annual Wellness Visit with me. I enjoyed our conversation and look forward to speaking with you again next year. I, as well as your care team,  appreciate your ongoing commitment to your health goals. Please review the following plan we discussed and let me know if I can assist you in the future. Your Game plan/ To Do List    Referrals: If you haven't heard from the office you've been referred to, please reach out to them at the phone provided.   Follow up Visits: We will see or speak with you next year for your Next Medicare AWV with our clinical staff on 05/04/25 at 3:50p.m Have you seen your provider in the last 6 months (3 months if uncontrolled diabetes)? Yes  Clinician Recommendations:  Aim for 30 minutes of exercise or brisk walking, 6-8 glasses of water, and 5 servings of fruits and vegetables each day.       This is a list of the screenings recommended for you:  Health Maintenance  Topic Date Due   COVID-19 Vaccine (1) Never done   HIV Screening  Never done   Cologuard (Stool DNA test)  Never done   Colon Cancer Screening  10/10/2023   Complete foot exam   12/31/2023   DEXA scan (bone density measurement)  01/04/2024   Pap with HPV screening  03/15/2024   Screening for Lung Cancer  04/21/2024   Medicare Annual Wellness Visit  05/01/2024   Zoster (Shingles) Vaccine (1 of 2) 07/17/2024*   Flu Shot  12/14/2024*   Pneumococcal Vaccine for age over 80 (2 of 2 - PCV) 04/16/2025*   Eye exam for diabetics  08/19/2024   Yearly kidney health urinalysis for diabetes  10/09/2024   Hemoglobin A1C  10/17/2024   Mammogram  01/07/2025   Yearly kidney function blood test for diabetes  04/16/2025   DTaP/Tdap/Td vaccine (3 - Td or Tdap) 03/16/2031   Hepatitis C Screening  Completed   Hepatitis B Vaccine  Aged Out   HPV Vaccine  Aged Out   Meningitis B Vaccine  Aged Out   Pneumococcal Vaccine  Discontinued   *Topic was postponed. The date shown is not the original due date.    Advanced directives: (Declined) Advance directive discussed with you today. Even though you declined this today, please call our office should you change your mind, and we can give you the proper paperwork for you to fill out. Advance Care Planning is important because it:  [x]  Makes sure you receive the medical care that is consistent with your values, goals, and preferences  [x]  It provides guidance to your family and loved ones and reduces their decisional burden about whether or not they are making the right decisions based on your wishes.  Follow the link provided in your after visit summary or read over the paperwork we have mailed to you to help you started getting your Advance Directives in place. If you need assistance in completing these, please reach out to us  so that we can help you!  See attachments for Preventive Care and Fall Prevention Tips.

## 2024-05-03 NOTE — Progress Notes (Addendum)
 Subjective:   Lisa Ball is a 63 y.o. who presents for a Medicare Wellness preventive visit.  As a reminder, Annual Wellness Visits don't include a physical exam, and some assessments may be limited, especially if this visit is performed virtually. We may recommend an in-person follow-up visit with your provider if needed.  Visit Complete: Virtual I connected with  Lisa Ball on 05/03/24 by a audio enabled telemedicine application and verified that I am speaking with the correct person using two identifiers.  Patient Location: Home  Provider Location: Home Office  I discussed the limitations of evaluation and management by telemedicine. The patient expressed understanding and agreed to proceed.  Vital Signs: Because this visit was a virtual/telehealth visit, some criteria may be missing or patient reported. Any vitals not documented were not able to be obtained and vitals that have been documented are patient reported.  VideoDeclined- This patient declined Librarian, academic. Therefore the visit was completed with audio only.  Persons Participating in Visit: Patient.  AWV Questionnaire: No: Patient Medicare AWV questionnaire was not completed prior to this visit.  Cardiac Risk Factors include: advanced age (>2men, >46 women);diabetes mellitus;dyslipidemia;hypertension;smoking/ tobacco exposure     Objective:    Today's Vitals   05/03/24 1529  BP: 136/80  Weight: 151 lb (68.5 kg)  Height: 5' 3 (1.6 m)   Body mass index is 26.75 kg/m.     05/03/2024    3:33 PM 04/08/2024    1:04 PM 05/02/2023    9:39 AM 04/17/2022    1:08 PM 04/16/2021    4:11 PM 04/12/2020    9:32 AM 03/01/2019    9:49 AM  Advanced Directives  Does Patient Have a Medical Advance Directive? No No No No No No No  Would patient like information on creating a medical advance directive?   Yes (MAU/Ambulatory/Procedural Areas - Information given) No - Patient declined Yes  (MAU/Ambulatory/Procedural Areas - Information given) No - Patient declined No - Patient declined      Data saved with a previous flowsheet row definition    Current Medications (verified) Outpatient Encounter Medications as of 05/03/2024  Medication Sig   albuterol  (VENTOLIN  HFA) 108 (90 Base) MCG/ACT inhaler Inhale 2 puffs into the lungs every 6 (six) hours as needed for wheezing or shortness of breath.   aspirin EC 81 MG tablet Take 81 mg by mouth daily.   atorvastatin  (LIPITOR) 40 MG tablet Take 1 tablet (40 mg total) by mouth daily.   diclofenac Sodium (VOLTAREN) 1 % GEL Apply 2 g topically 4 (four) times daily.   escitalopram  (LEXAPRO ) 20 MG tablet Take 1 tablet (20 mg total) by mouth daily. (Patient taking differently: Take 40 mg by mouth daily.)   fenofibrate  160 MG tablet Take 1 tablet (160 mg total) by mouth daily.   hydroxychloroquine (PLAQUENIL) 200 MG tablet Take 1 tablet by mouth daily.   lisinopril  (ZESTRIL ) 40 MG tablet Take 1 tablet (40 mg total) by mouth daily.   metFORMIN  (GLUCOPHAGE ) 1000 MG tablet Take 1 tablet (1,000 mg total) by mouth 2 (two) times daily with a meal.   ORENCIA CLICKJECT 125 MG/ML SOAJ Inject 125 mg into the skin.   potassium chloride  SA (KLOR-CON  M) 20 MEQ tablet Take 1 tablet (20 mEq total) by mouth daily.   Secukinumab (COSENTYX SENSOREADY PEN) 150 MG/ML SOAJ Inject the contents of 1 pen into the skin every 4 weeks (Patient not taking: Reported on 05/03/2024)   No facility-administered encounter  medications on file as of 05/03/2024.    Allergies (verified) Methotrexate, Methotrexate derivatives, and Penicillins   History: Past Medical History:  Diagnosis Date   Anxiety    Colon polyps    Depression    Hyperlipidemia    Hypertension    Psoriatic arthritis ([name])    Pulmonary fibrosis ([name])    RA (rheumatoid arthritis) ([name])    Past Surgical History:  Procedure Laterality Date   BREAST CYST EXCISION Left    many years ago    CHOLECYSTECTOMY     TUBAL LIGATION     Family History  Problem Relation Age of Onset   Heart disease Mother    Cancer Father 35       lung cancer-died at age 10   Diabetes Sister    Seizures Maternal Grandmother    Heart disease Maternal Grandfather    Seizures Paternal Grandmother    Heart disease Paternal Grandfather    Breast cancer Paternal Aunt    Colon cancer Neg Hx    Esophageal cancer Neg Hx    Rectal cancer Neg Hx    Stomach cancer Neg Hx    Social History   Socioeconomic History   Marital status: Widowed    Spouse name: Not on file   Number of children: 1   Years of education: 9   Highest education level: 9th grade  Occupational History   Occupation: disability  Tobacco Use   Smoking status: Every Day    Current packs/day: 1.00    Average packs/day: 1 pack/day for 35.0 years (35.0 ttl pk-yrs)    Types: Cigarettes   Smokeless tobacco: Never  Vaping Use   Vaping status: Never Used  Substance and Sexual Activity   Alcohol use: No   Drug use: No   Sexual activity: Not Currently    Birth control/protection: Surgical  Other Topics Concern   Not on file  Social History Narrative   Not on file   Social Drivers of Health   Financial Resource Strain: Low Risk  (05/03/2024)   Overall Financial Resource Strain (CARDIA)    Difficulty of Paying Living Expenses: Not hard at all  Food Insecurity: No Food Insecurity (05/03/2024)   Hunger Vital Sign    Worried About Running Out of Food in the Last Year: Never true    Ran Out of Food in the Last Year: Never true  Transportation Needs: No Transportation Needs (05/03/2024)   PRAPARE - Administrator, Civil Service (Medical): No    Lack of Transportation (Non-Medical): No  Physical Activity: Insufficiently Active (05/03/2024)   Exercise Vital Sign    Days of Exercise per Week: 2 days    Minutes of Exercise per Session: 50 min  Stress: No Stress Concern Present (05/02/2023)   [employer] of Occupational  Health - Occupational Stress Questionnaire    Feeling of Stress : Not at all  Social Connections: Moderately Isolated (05/03/2024)   Social Connection and Isolation Panel    Frequency of Communication with Friends and Family: More than three times a week    Frequency of Social Gatherings with Friends and Family: More than three times a week    Attends Religious Services: More than 4 times per year    Active Member of [employer] or Organizations: No    Attends Banker Meetings: Never    Marital Status: Widowed    Tobacco Counseling Ready to quit: No Counseling given: Yes    Clinical Intake:  Pre-visit preparation  completed: Yes  Pain : No/denies pain     BMI - recorded: 26.75 Nutritional Status: BMI 25 -29 Overweight Nutritional Risks: None Diabetes: Yes  Lab Results  Component Value Date   HGBA1C 5.4 04/16/2024   HGBA1C 6.1 (H) 10/10/2023   HGBA1C 6.6 (H) 04/07/2023     How often do you need to have someone help you when you read instructions, pamphlets, or other written materials from your doctor or pharmacy?: 1 - Never  Interpreter Needed?: No  Information entered by :: alia t/cma   Activities of Daily Living     05/03/2024    3:33 PM  In your present state of health, do you have any difficulty performing the following activities:  Hearing? 0  Vision? 0  Difficulty concentrating or making decisions? 0  Walking or climbing stairs? 0  Dressing or bathing? 0  Doing errands, shopping? 0  Preparing Food and eating ? N  Using the Toilet? N  In the past six months, have you accidently leaked urine? Y  Do you have problems with loss of bowel control? Y  Managing your Medications? N  Managing your Finances? N  Housekeeping or managing your Housekeeping? N    Patient Care Team: Gladis Mustard, FNP as PCP - General (Nurse Practitioner)  I have updated your Care Teams any recent Medical Services you may have received from other providers in the  past year.     Assessment:   This is a routine wellness examination for Lisa Ball.  Hearing/Vision screen Hearing Screening - Comments:: Pt denies hearing dif Vision Screening - Comments:: Pt wear reading glasses/pt goes to [facility] [address] In [place]/last ov over yr   Goals Addressed             This Visit's Progress    Patient Stated       Continue to keep doing the PT work outs even after PT is over       Depression Screen     05/03/2024    3:35 PM 04/16/2024   12:12 PM 10/10/2023   11:40 AM 05/02/2023    9:38 AM 04/07/2023   11:45 AM 12/31/2022   10:30 AM 11/27/2022    4:36 PM  PHQ 2/9 Scores  PHQ - 2 Score 0 0 0 0 0 0 0  PHQ- 9 Score 0 0  0 0 0 0    Fall Risk     05/03/2024    3:31 PM 04/16/2024   12:12 PM 10/10/2023   11:40 AM 05/02/2023    9:36 AM 04/07/2023   11:45 AM  Fall Risk   Falls in the past year? 0 0 1 0 0  Number falls in past yr: 0  0 0   Injury with Fall? 0  0 0   Risk for fall due to : No Fall Risks  History of fall(s) No Fall Risks   Follow up Falls evaluation completed  Education provided Falls prevention discussed     MEDICARE RISK AT HOME:  Medicare Risk at Home Any stairs in or around the home?: Yes If so, are there any without handrails?: Yes Home free of loose throw rugs in walkways, pet beds, electrical cords, etc?: Yes Adequate lighting in your home to reduce risk of falls?: Yes Life alert?: No Use of a cane, walker or w/c?: No Grab bars in the bathroom?: Yes Shower chair or bench in shower?: No Elevated toilet seat or a handicapped toilet?: No  TIMED UP AND GO:  Was  the test performed?  no  Cognitive Function: 6CIT completed        05/03/2024    3:40 PM 05/02/2023    9:39 AM 04/17/2022    1:10 PM 04/12/2020    9:34 AM 03/01/2019    9:52 AM  6CIT Screen  What Year? 0 points 0 points 0 points 0 points 0 points  What month? 0 points 0 points 0 points 0 points 0 points  What time? 0 points 0 points 0 points 0 points 0 points  Count back  from 20 0 points 0 points 0 points 0 points 0 points  Months in reverse 0 points 0 points 0 points 0 points 0 points  Repeat phrase 0 points 0 points 0 points 0 points 2 points  Total Score 0 points 0 points 0 points 0 points 2 points    Immunizations Immunization History  Administered Date(s) Administered   Influenza,inj,Quad PF,6+ Mos 07/24/2011   Influenza-Unspecified 05/19/2014   PPD Test 09/23/2012, 03/30/2014, 03/27/2015, 03/28/2016, 03/25/2017, 10/06/2018   Pneumococcal Polysaccharide-23 10/16/2004   Tdap 01/31/2011, 03/15/2021    Screening Tests Health Maintenance  Topic Date Due   COVID-19 Vaccine (1) Never done   HIV Screening  Never done   Fecal DNA (Cologuard)  Never done   Colonoscopy  10/10/2023   FOOT EXAM  12/31/2023   DEXA SCAN  01/04/2024   Cervical Cancer Screening (HPV/Pap Cotest)  03/15/2024   Lung Cancer Screening  04/21/2024   Zoster Vaccines- Shingrix (1 of 2) 07/17/2024 (Originally [birth year]/09/1980)   INFLUENZA VACCINE  12/14/2024 (Originally 04/16/2024)   Pneumococcal Vaccine: 50+ Years (2 of 2 - PCV) 04/16/2025 (Originally 10/16/2005)   OPHTHALMOLOGY EXAM  08/19/2024   Diabetic kidney evaluation - Urine ACR  10/09/2024   HEMOGLOBIN A1C  10/17/2024   MAMMOGRAM  01/07/2025   Diabetic kidney evaluation - eGFR measurement  04/16/2025   Medicare Annual Wellness (AWV)  05/03/2025   DTaP/Tdap/Td (3 - Td or Tdap) 03/16/2031   Hepatitis C Screening  Completed   Hepatitis B Vaccines 19-59 Average Risk  Aged Out   HPV VACCINES  Aged Out   Meningococcal B Vaccine  Aged Out   Pneumococcal Vaccine  Discontinued    Health Maintenance  Health Maintenance Due  Topic Date Due   COVID-19 Vaccine (1) Never done   HIV Screening  Never done   Fecal DNA (Cologuard)  Never done   Colonoscopy  10/10/2023   FOOT EXAM  12/31/2023   DEXA SCAN  01/04/2024   Cervical Cancer Screening (HPV/Pap Cotest)  03/15/2024   Lung Cancer Screening  04/21/2024   Health Maintenance  Items Addressed: DEXA ordered, Ref to GI ordered, Lung Cancer Screening ordered  Additional Screening:  Vision Screening: Recommended annual ophthalmology exams for early detection of glaucoma and other disorders of the eye. Would you like a referral to an eye doctor? No    Dental Screening: Recommended annual dental exams for proper oral hygiene  Community Resource Referral / Chronic Care Management: CRR required this visit?  No   CCM required this visit?  No   Plan:    I have personally reviewed and noted the following in the patient's chart:   Medical and social history Use of alcohol, tobacco or illicit drugs  Current medications and supplements including opioid prescriptions. Patient is not currently taking opioid prescriptions. Functional ability and status Nutritional status Physical activity Advanced directives List of other physicians Hospitalizations, surgeries, and ER visits in previous 12 months Vitals Screenings to  include cognitive, depression, and falls Referrals and appointments  In addition, I have reviewed and discussed with patient certain preventive protocols, quality metrics, and best practice recommendations. A written personalized care plan for preventive services as well as general preventive health recommendations were provided to patient.   Ozie Ned, CMA   05/03/2024   After Visit Summary: (MyChart) Due to this being a telephonic visit, the after visit summary with patients personalized plan was offered to patient via MyChart   Notes: PCP Follow Up Recommendations: Pt is aware and due the following: Pap smear, colonoscopy-ordered, dexa-ordered, lung cancer screening-ordered, foot exam  I have reviewed and agree with the above AWV documentation.   Mary-Margaret Gladis, FNP

## 2024-05-05 ENCOUNTER — Encounter

## 2024-05-05 ENCOUNTER — Ambulatory Visit

## 2024-05-05 DIAGNOSIS — M6281 Muscle weakness (generalized): Secondary | ICD-10-CM | POA: Diagnosis not present

## 2024-05-05 DIAGNOSIS — M79605 Pain in left leg: Secondary | ICD-10-CM | POA: Diagnosis not present

## 2024-05-05 DIAGNOSIS — M5459 Other low back pain: Secondary | ICD-10-CM | POA: Diagnosis not present

## 2024-05-05 NOTE — Therapy (Signed)
 OUTPATIENT PHYSICAL THERAPY THORACOLUMBAR TREATMENT   Patient Name: Lisa Ball MRN: [id number] DOB:[birth year], 63 y.o., female Today's Date: 05/05/2024  END OF SESSION:  PT End of Session - 05/05/24 1553     Visit Number 7    Number of Visits 12    Date for PT Re-Evaluation 06/11/24    PT Start Time 1555    PT Stop Time 1640    PT Time Calculation (min) 45 min    Activity Tolerance Patient tolerated treatment well    Behavior During Therapy [facility] for tasks assessed/performed          Past Medical History:  Diagnosis Date   Anxiety    Colon polyps    Depression    Hyperlipidemia    Hypertension    Psoriatic arthritis ([name])    Pulmonary fibrosis ([name])    RA (rheumatoid arthritis) ([name])    Past Surgical History:  Procedure Laterality Date   BREAST CYST EXCISION Left    many years ago   CHOLECYSTECTOMY     TUBAL LIGATION     Patient Active Problem List   Diagnosis Date Noted   Seropositive rheumatoid arthritis of multiple sites ([name]) 10/09/2023   Diabetes mellitus treated with oral medication ([name]) 04/07/2023   History of CVA (cerebrovascular accident) 10/01/2021   Personal history of tobacco use, presenting hazards to health 06/05/2021   Osteoarthritis of multiple joints 10/27/2018   Psoriasis 12/23/2017   BMI 29.0-29.9,adult 06/20/2016   Smoker 09/19/2014   Depression 03/22/2013   Hypertension 03/22/2013   Osteopenia 03/22/2013   Hyperlipidemia 03/22/2013   Psoriatic arthritis ([name]) 03/22/2013    PCP: Gladis Ronal Quant, FNP  REFERRING PROVIDER: Severa Rock HERO, FNP  REFERRING DIAG: Acute bilateral low back pain with left-sided sciatica   Rationale for Evaluation and Treatment: Rehabilitation  THERAPY DIAG:  Other low back pain  Muscle weakness (generalized)  Pain in left leg  ONSET DATE: One month ago   SUBJECTIVE:                                                                                                                                                                                            SUBJECTIVE STATEMENT: Pt states they are doing well today and feeling a lot better.  PERTINENT HISTORY:  DM, HTN, Osteopenia, RA, OA, depression, anxiety, smoker  PAIN:  Are you having pain? No  PRECAUTIONS: None  RED FLAGS: None   WEIGHT BEARING RESTRICTIONS: No  FALLS:  Has patient fallen in last 6 months? No  LIVING ENVIRONMENT: Lives with: lives with their family, care taker to cousin Lives in: House/apartment Stairs: No  Has following equipment at home: shower chair and Grab bars, ramp  OCCUPATION: caretaker   PLOF: Independent with community mobility without device  PATIENT GOALS: reduce pain, get back to care taking, cousin weighs 200 pounds and needs to pull him and turn him.  NEXT MD VISIT: 04/12/2024  OBJECTIVE:  Note: Objective measures were completed at Evaluation unless otherwise noted.  DIAGNOSTIC FINDINGS:   03/30/24 lumbar x-ray 1. Grade 1 anterolisthesis of L4, presumed chronic. 2. Mild multilevel spondylosis with anterior osteophytosis and disc space height loss, greatest at L5-S1 where it is moderate. 3. Advanced facet arthropathy at L4-L5 and L5-S1.  PATIENT SURVEYS:  Modified Oswestry Disability Index: 10/50  COGNITION: Overall cognitive status: Within functional limits for tasks assessed     SENSATION: Diminished L2-S1 dermatomes on L compared to R   POSTURE: rounded shoulders, forward head, and decreased lumbar lordosis  PALPATION: Muscles: TTP along Lumbar paraspinals L3-L5, L Piriformis, L QL, L glute med.   Joint: Hypomobile L2-L5, PSIS was TTP  LUMBAR ROM:   AROM eval  Flexion 78  Extension 10  Right lateral flexion To patella  Left lateral flexion To patella  Right rotation 25% limited  Left rotation 25% limited   (Blank rows = not tested)   LOWER EXTREMITY MMT:    MMT Right eval Left eval  Hip flexion 4- 3+ painful  Hip extension    Hip abduction     Hip adduction    Hip internal rotation    Hip external rotation    Knee flexion 4 4-  Knee extension 4 4  Ankle dorsiflexion 4- 3+  Ankle plantarflexion    Ankle inversion    Ankle eversion     (Blank rows = not tested)  LUMBAR SPECIAL TESTS:  Slump test: Positive (L leg) R Leg (painful on L side) Repeated Motions: Forward flexion 10x, no exacerbation of symptoms   GAIT: Distance walked: To be evaluated next visit Assistive device utilized: None Level of assistance: Complete Independence Comments: Patient ambulates with decreased lordosis, decreased stance time on L.  TREATMENT DATE:   05/05/2024                                    EXERCISE LOG  Exercise Repetitions and Resistance Comments  TM 1 x 15 min   Standing Marching 2# 2 min on airex   LAQ 4# 2 min   Paloff Press  30 x blue band each   Seated hip abd 2 min   Seated hip add 2 min   Standing hip extension 2 min   Lumbar side bending  4# x 3.5 minutes   Open Book stretch 3x10s hold each side with OP    Blank cell = exercise not performed today  04/29/2024                                    EXERCISE LOG  Exercise Repetitions and Resistance Comments  Nustep L3x15 min   Prone press-ups 2x10  2nd set with knee raises  Seated Marches 4# 2x30s   LAQ 4# 2x30s   Paloff press 2x15 blue tb    Open book supine stretch 5x ea side with 10s hold   Glute bridges 2x10   BTK 2 min   Lumbar rotation w/ball  2 min   Around the worlds 2x45s  Dumbell around stomach/back   Blank cell = exercise not performed today  IFC at 80-150Hz  on 40% scan x 15 minutes; no adverse reaction to today's modalities  04/27/2024                                   EXERCISE LOG  Exercise Repetitions and Resistance Comments  Treadmill  x 15 mins        Seated Marches 3# 3x15    LAQs 3# 2x20    HS curl  2x15 red tb    Toe raises/calf raises  2x30s each on airex   Prone Press-ups  1x12    Modified elbow plank 1x10, 1x5 (opp arm-leg) Cue  to lift knees up  Rockerboard 3 mins     Blank cell = exercise not performed today  PATIENT EDUCATION:  Education details: positioning, core exercises and HEP. Person educated: Patient Education method: Explanation, Demonstration, and Tactile cues Education comprehension: verbalized understanding  HOME EXERCISE PROGRAM: Sciatic Nerve Flossing 2x/day 2 minutes total, Piriformis tennis ball massage 2 min/day or to tolerance, standing flexion repeated motions 2 min/day.  ASSESSMENT:  CLINICAL IMPRESSION: Patient presents to clinic today stating they are feeling really good. Progressed patient today with hip strengthening for global proximal stability. Patient required BUE support for hip extension today to appropriately perform exercise. Verbal cueing required to keep trunk straight and activate core to maintain upright posture. Patient tolerated hip strengthening well, but noted fatigue towards the end of session. Patient states they have been doing their mobility exercises as previously discussed and pain has been stable at low levels, and not radiating into legs. Patient continues to require skilled physical therapy to address impairments below.   OBJECTIVE IMPAIRMENTS: Abnormal gait, decreased activity tolerance, decreased mobility, difficulty walking, decreased ROM, decreased strength, increased muscle spasms, impaired sensation, and pain.   ACTIVITY LIMITATIONS: carrying, lifting, bending, standing, squatting, stairs, and transfers  PARTICIPATION LIMITATIONS: community activity and occupation  PERSONAL FACTORS: Age, Fitness, and 1-2 comorbidities: DM, RA are also affecting patient's functional outcome.   REHAB POTENTIAL: Good  CLINICAL DECISION MAKING: Evolving/moderate complexity  EVALUATION COMPLEXITY: Moderate   GOALS: Goals reviewed with patient? No  SHORT TERM GOALS: Target date: 04/26/24  Patient will be compliant with HEP Baseline: Goal status: INITIAL  2.  Patient  will increase lumbar extension AROM to 20 degrees in order to improve mobility for walking with proper body mechanics Baseline:  Goal status: INITIAL  3.  Patient will increase hip flexion strength to 4/5 bilaterally in order to improve body mechanics while lifting for work. Baseline:  Goal status: INITIAL    LONG TERM GOALS: Target date: 05/17/24  Patient will be able to pull 40 pounds with proper body mechanics and no pain in order to return to caretaking.  Baseline:  Goal status: INITIAL  2.  Patient will be able to maintain lumbar spine in neutral while lifting 20 pounds from ground, without pain, in order to improve function with ADL's Baseline:  Goal status: INITIAL  3.  Patient will be able to lay supine without increased pain in order to sleep through the night comfortably without pain. Baseline:  Goal status: INITIAL  4.  Patient will improve ODI by 5 points in order to demonstrate improved tolerance with ADL's. Baseline:  Goal status: INITIAL    PLAN:  PT FREQUENCY: 2x/week  PT DURATION: 6 weeks  PLANNED INTERVENTIONS: 02835- PT  Re-evaluation, 97750- Physical Performance Testing, 97110-Therapeutic exercises, 97530- Therapeutic activity, [id number]- Neuromuscular re-education, 97535- Self Care, [id number]- Manual therapy, [phone]- Gait training, [phone]- Electrical stimulation (unattended), [phone]- Traction (mechanical), [phone] (1-2 muscles), [phone] (3+ muscles)- Dry Needling, Patient/Family education, Joint mobilization, Spinal mobilization, Cryotherapy, and Moist heat.  PLAN FOR NEXT SESSION: Plan perform manual therapy, gentle lumbar extension exercises, core strengthening and LE strengthening.   Estefana Jude, Student-PT 05/05/2024, 4:44 PM

## 2024-05-07 ENCOUNTER — Encounter

## 2024-05-10 ENCOUNTER — Encounter: Payer: Self-pay | Admitting: *Deleted

## 2024-05-11 ENCOUNTER — Ambulatory Visit: Payer: Self-pay

## 2024-05-11 DIAGNOSIS — M6281 Muscle weakness (generalized): Secondary | ICD-10-CM | POA: Diagnosis not present

## 2024-05-11 DIAGNOSIS — M5459 Other low back pain: Secondary | ICD-10-CM

## 2024-05-11 DIAGNOSIS — M79605 Pain in left leg: Secondary | ICD-10-CM | POA: Diagnosis not present

## 2024-05-11 NOTE — Therapy (Signed)
 OUTPATIENT PHYSICAL THERAPY THORACOLUMBAR TREATMENT   Patient Name: Lisa Ball MRN: [id number] DOB:1960/09/21, 63 y.o., female Today's Date: 05/11/2024  END OF SESSION:  PT End of Session - 05/11/24 1520     Visit Number 8    Number of Visits 12    Date for PT Re-Evaluation 06/11/24    PT Start Time 1515    PT Stop Time 1559    PT Time Calculation (min) 44 min    Activity Tolerance Patient tolerated treatment well    Behavior During Therapy [facility] for tasks assessed/performed          Past Medical History:  Diagnosis Date   Anxiety    Colon polyps    Depression    Hyperlipidemia    Hypertension    Psoriatic arthritis ([name])    Pulmonary fibrosis ([name])    RA (rheumatoid arthritis) ([name])    Past Surgical History:  Procedure Laterality Date   BREAST CYST EXCISION Left    many years ago   CHOLECYSTECTOMY     TUBAL LIGATION     Patient Active Problem List   Diagnosis Date Noted   Seropositive rheumatoid arthritis of multiple sites ([name]) 10/09/2023   Diabetes mellitus treated with oral medication ([name]) 04/07/2023   History of CVA (cerebrovascular accident) 10/01/2021   Personal history of tobacco use, presenting hazards to health 06/05/2021   Osteoarthritis of multiple joints 10/27/2018   Psoriasis 12/23/2017   BMI 29.0-29.9,adult 06/20/2016   Smoker 09/19/2014   Depression 03/22/2013   Hypertension 03/22/2013   Osteopenia 03/22/2013   Hyperlipidemia 03/22/2013   Psoriatic arthritis ([name]) 03/22/2013    PCP: Gladis Ronal Quant, FNP  REFERRING PROVIDER: Severa Rock HERO, FNP  REFERRING DIAG: Acute bilateral low back pain with left-sided sciatica   Rationale for Evaluation and Treatment: Rehabilitation  THERAPY DIAG:  Other low back pain  Muscle weakness (generalized)  ONSET DATE: One month ago   SUBJECTIVE:                                                                                                                                                                                            SUBJECTIVE STATEMENT: Pt denies any pain today.  PERTINENT HISTORY:  DM, HTN, Osteopenia, RA, OA, depression, anxiety, smoker  PAIN:  Are you having pain? No  PRECAUTIONS: None  RED FLAGS: None   WEIGHT BEARING RESTRICTIONS: No  FALLS:  Has patient fallen in last 6 months? No  LIVING ENVIRONMENT: Lives with: lives with their family, care taker to cousin Lives in: House/apartment Stairs: No Has following equipment at home: shower chair and Grab bars, ramp  OCCUPATION: caretaker   PLOF: Independent with community mobility without device  PATIENT GOALS: reduce pain, get back to care taking, cousin weighs 200 pounds and needs to pull him and turn him.  NEXT MD VISIT: 04/12/2024  OBJECTIVE:  Note: Objective measures were completed at Evaluation unless otherwise noted.  DIAGNOSTIC FINDINGS:   03/30/24 lumbar x-ray 1. Grade 1 anterolisthesis of L4, presumed chronic. 2. Mild multilevel spondylosis with anterior osteophytosis and disc space height loss, greatest at L5-S1 where it is moderate. 3. Advanced facet arthropathy at L4-L5 and L5-S1.  PATIENT SURVEYS:  Modified Oswestry Disability Index: 10/50  COGNITION: Overall cognitive status: Within functional limits for tasks assessed     SENSATION: Diminished L2-S1 dermatomes on L compared to R   POSTURE: rounded shoulders, forward head, and decreased lumbar lordosis  PALPATION: Muscles: TTP along Lumbar paraspinals L3-L5, L Piriformis, L QL, L glute med.   Joint: Hypomobile L2-L5, PSIS was TTP  LUMBAR ROM:   AROM eval  Flexion 78  Extension 10  Right lateral flexion To patella  Left lateral flexion To patella  Right rotation 25% limited  Left rotation 25% limited   (Blank rows = not tested)   LOWER EXTREMITY MMT:    MMT Right eval Left eval  Hip flexion 4- 3+ painful  Hip extension    Hip abduction    Hip adduction    Hip internal rotation    Hip  external rotation    Knee flexion 4 4-  Knee extension 4 4  Ankle dorsiflexion 4- 3+  Ankle plantarflexion    Ankle inversion    Ankle eversion     (Blank rows = not tested)  LUMBAR SPECIAL TESTS:  Slump test: Positive (L leg) R Leg (painful on L side) Repeated Motions: Forward flexion 10x, no exacerbation of symptoms   GAIT: Distance walked: To be evaluated next visit Assistive device utilized: None Level of assistance: Complete Independence Comments: Patient ambulates with decreased lordosis, decreased stance time on L.  TREATMENT DATE:   05/11/2024                                   EXERCISE LOG  Exercise Repetitions and Resistance Comments  TM 1 mph x 15 min   Standing Marching 2# 2 min on airex   LAQ 4# 2.5 min   Paloff Press     Standing hip abd 2# 20 reps bil   Seated hip add    Standing hip extension 2# x 20 reps bil   Rows Blue x 25 reps   Extensions Blue x 25 reps   Squats 10# x 10 reps    Lumbar side bending     Open Book stretch    Goal Assessment See Below    Blank cell = exercise not performed today   04/29/2024                                    EXERCISE LOG  Exercise Repetitions and Resistance Comments  Nustep L3x15 min   Prone press-ups 2x10  2nd set with knee raises  Seated Marches 4# 2x30s   LAQ 4# 2x30s   Paloff press 2x15 blue tb    Open book supine stretch 5x ea side with 10s hold   Glute bridges 2x10   BTK 2 min   Lumbar rotation w/ball  2 min   Around the worlds 2x45s Dumbell around stomach/back   Blank cell = exercise not performed today  IFC at 80-150Hz  on 40% scan x 15 minutes; no adverse reaction to today's modalities  04/27/2024                                   EXERCISE LOG  Exercise Repetitions and Resistance Comments  Treadmill  x 15 mins        Seated Marches 3# 3x15    LAQs 3# 2x20    HS curl  2x15 red tb    Toe raises/calf raises  2x30s each on airex   Prone Press-ups  1x12    Modified elbow plank 1x10, 1x5  (opp arm-leg) Cue to lift knees up  Rockerboard 3 mins     Blank cell = exercise not performed today  PATIENT EDUCATION:  Education details: positioning, core exercises and HEP. Person educated: Patient Education method: Explanation, Demonstration, and Tactile cues Education comprehension: verbalized understanding  HOME EXERCISE PROGRAM: Sciatic Nerve Flossing 2x/day 2 minutes total, Piriformis tennis ball massage 2 min/day or to tolerance, standing flexion repeated motions 2 min/day.  ASSESSMENT:  CLINICAL IMPRESSION: Pt arrives for today's treatment session denying any pain.  Pt able to demonstrate 20 degrees of lumbar extensions meeting her STG.  Pt also able to demonstrate 4/5 bil hip flexor strength, meeting that STG as well.  Pt introduced to UE exercises today to simulate care-giving activities.  Pt also introduced to weighted squats to simulate lifting a patient.  Pt requiring min cues for proper technique and body mechanics.  Pt denied any pain at completion of today's treatment session.    OBJECTIVE IMPAIRMENTS: Abnormal gait, decreased activity tolerance, decreased mobility, difficulty walking, decreased ROM, decreased strength, increased muscle spasms, impaired sensation, and pain.   ACTIVITY LIMITATIONS: carrying, lifting, bending, standing, squatting, stairs, and transfers  PARTICIPATION LIMITATIONS: community activity and occupation  PERSONAL FACTORS: Age, Fitness, and 1-2 comorbidities: DM, RA are also affecting patient's functional outcome.   REHAB POTENTIAL: Good  CLINICAL DECISION MAKING: Evolving/moderate complexity  EVALUATION COMPLEXITY: Moderate   GOALS: Goals reviewed with patient? No  SHORT TERM GOALS: Target date: 04/26/24  Patient will be compliant with HEP Baseline: Goal status: MET  2.  Patient will increase lumbar extension AROM to 20 degrees in order to improve mobility for walking with proper body mechanics Baseline: 8/26: 20 degrees Goal  status: MET  3.  Patient will increase hip flexion strength to 4/5 bilaterally in order to improve body mechanics while lifting for work. Baseline: 8/26: 4/5 bil hip flexion  Goal status: MET  LONG TERM GOALS: Target date: 05/17/24  Patient will be able to pull 40 pounds with proper body mechanics and no pain in order to return to caretaking.  Baseline:  Goal status: IN PROGRESS  2.  Patient will be able to maintain lumbar spine in neutral while lifting 20 pounds from ground, without pain, in order to improve function with ADL's Baseline:  Goal status: IN PROGRESS  3.  Patient will be able to lay supine without increased pain in order to sleep through the night comfortably without pain. Baseline: 8/26: does not wake up bc of back pain Goal status: MET  4.  Patient will improve ODI by 5 points in order to demonstrate improved tolerance with ADL's. Baseline: 8/26: 7/50 Goal status: IN PROGRESS    PLAN:  PT FREQUENCY: 2x/week  PT DURATION: 6 weeks  PLANNED INTERVENTIONS: 97164- PT Re-evaluation, 97750- Physical Performance Testing, 97110-Therapeutic exercises, 97530- Therapeutic activity, [id number]- Neuromuscular re-education, 97535- Self Care, [id number]- Manual therapy, [id number]- Gait training, [phone]- Electrical stimulation (unattended), [phone]- Traction (mechanical), [phone] (1-2 muscles), [phone] (3+ muscles)- Dry Needling, Patient/Family education, Joint mobilization, Spinal mobilization, Cryotherapy, and Moist heat.  PLAN FOR NEXT SESSION: Plan perform manual therapy, gentle lumbar extension exercises, core strengthening and LE strengthening.   Delon DELENA Gosling, PTA 05/11/2024, 4:34 PM

## 2024-05-13 ENCOUNTER — Other Ambulatory Visit

## 2024-05-19 ENCOUNTER — Ambulatory Visit: Attending: Family Medicine

## 2024-05-19 DIAGNOSIS — M5459 Other low back pain: Secondary | ICD-10-CM | POA: Diagnosis not present

## 2024-05-19 DIAGNOSIS — M6281 Muscle weakness (generalized): Secondary | ICD-10-CM | POA: Diagnosis not present

## 2024-05-19 DIAGNOSIS — M79605 Pain in left leg: Secondary | ICD-10-CM | POA: Insufficient documentation

## 2024-05-19 NOTE — Therapy (Signed)
 OUTPATIENT PHYSICAL THERAPY THORACOLUMBAR TREATMENT   Patient Name: Lisa Ball MRN: [id number] DOB:1960/09/23, 63 y.o., female Today's Date: 05/19/2024  END OF SESSION:  PT End of Session - 05/19/24 1435     Visit Number 9    Number of Visits 12    Date for PT Re-Evaluation 06/11/24    PT Start Time 1431    PT Stop Time 1509    PT Time Calculation (min) 38 min    Activity Tolerance Patient tolerated treatment well    Behavior During Therapy WFL for tasks assessed/performed          Past Medical History:  Diagnosis Date   Anxiety    Colon polyps    Depression    Hyperlipidemia    Hypertension    Psoriatic arthritis ([name])    Pulmonary fibrosis ([name])    RA (rheumatoid arthritis) ([name])    Past Surgical History:  Procedure Laterality Date   BREAST CYST EXCISION Left    many years ago   CHOLECYSTECTOMY     TUBAL LIGATION     Patient Active Problem List   Diagnosis Date Noted   Seropositive rheumatoid arthritis of multiple sites ([name]) 10/09/2023   Diabetes mellitus treated with oral medication ([name]) 04/07/2023   History of CVA (cerebrovascular accident) 10/01/2021   Personal history of tobacco use, presenting hazards to health 06/05/2021   Osteoarthritis of multiple joints 10/27/2018   Psoriasis 12/23/2017   BMI 29.0-29.9,adult 06/20/2016   Smoker 09/19/2014   Depression 03/22/2013   Hypertension 03/22/2013   Osteopenia 03/22/2013   Hyperlipidemia 03/22/2013   Psoriatic arthritis ([name]) 03/22/2013    PCP: Gladis Ronal Quant, FNP  REFERRING PROVIDER: Severa Rock HERO, FNP  REFERRING DIAG: Acute bilateral low back pain with left-sided sciatica   Rationale for Evaluation and Treatment: Rehabilitation  THERAPY DIAG:  Other low back pain  Muscle weakness (generalized)  ONSET DATE: One month ago   SUBJECTIVE:                                                                                                                                                                                            SUBJECTIVE STATEMENT: Pt denies any pain today.  PERTINENT HISTORY:  DM, HTN, Osteopenia, RA, OA, depression, anxiety, smoker  PAIN:  Are you having pain? No  PRECAUTIONS: None  RED FLAGS: None   WEIGHT BEARING RESTRICTIONS: No  FALLS:  Has patient fallen in last 6 months? No  LIVING ENVIRONMENT: Lives with: lives with their family, care taker to cousin Lives in: House/apartment Stairs: No Has following equipment at home: shower chair and Grab bars, ramp  OCCUPATION: caretaker   PLOF: Independent with community mobility without device  PATIENT GOALS: reduce pain, get back to care taking, cousin weighs 200 pounds and needs to pull him and turn him.  NEXT MD VISIT: 04/12/2024  OBJECTIVE:  Note: Objective measures were completed at Evaluation unless otherwise noted.  DIAGNOSTIC FINDINGS:   03/30/24 lumbar x-ray 1. Grade 1 anterolisthesis of L4, presumed chronic. 2. Mild multilevel spondylosis with anterior osteophytosis and disc space height loss, greatest at L5-S1 where it is moderate. 3. Advanced facet arthropathy at L4-L5 and L5-S1.  PATIENT SURVEYS:  Modified Oswestry Disability Index: 10/50  COGNITION: Overall cognitive status: Within functional limits for tasks assessed     SENSATION: Diminished L2-S1 dermatomes on L compared to R   POSTURE: rounded shoulders, forward head, and decreased lumbar lordosis  PALPATION: Muscles: TTP along Lumbar paraspinals L3-L5, L Piriformis, L QL, L glute med.   Joint: Hypomobile L2-L5, PSIS was TTP  LUMBAR ROM:   AROM eval  Flexion 78  Extension 10  Right lateral flexion To patella  Left lateral flexion To patella  Right rotation 25% limited  Left rotation 25% limited   (Blank rows = not tested)   LOWER EXTREMITY MMT:    MMT Right eval Left eval  Hip flexion 4- 3+ painful  Hip extension    Hip abduction    Hip adduction    Hip internal rotation    Hip external  rotation    Knee flexion 4 4-  Knee extension 4 4  Ankle dorsiflexion 4- 3+  Ankle plantarflexion    Ankle inversion    Ankle eversion     (Blank rows = not tested)  LUMBAR SPECIAL TESTS:  Slump test: Positive (L leg) R Leg (painful on L side) Repeated Motions: Forward flexion 10x, no exacerbation of symptoms   GAIT: Distance walked: To be evaluated next visit Assistive device utilized: None Level of assistance: Complete Independence Comments: Patient ambulates with decreased lordosis, decreased stance time on L.  TREATMENT DATE:   05/19/2024                                   EXERCISE LOG  Exercise Repetitions and Resistance Comments  TM 1.2 mph x 16 min   Standing Marching    LAQ    Paloff Press     Standing hip abd    Seated hip add    Standing hip extension    Rows Blue x 25 reps   Extensions Blue x 25 reps   Squats 14# x 10 reps    Simulated Pt Pulling 20# x 5 reps each way   Cybex Knee Flexion 30# x 3.5 mins   Cybex Knee Extension 10# x 3.5 mins   Lumbar side bending     Open Book stretch    Goal Assessment     Blank cell = exercise not performed today   04/29/2024                                    EXERCISE LOG  Exercise Repetitions and Resistance Comments  Nustep L3x15 min   Prone press-ups 2x10  2nd set with knee raises  Seated Marches 4# 2x30s   LAQ 4# 2x30s   Paloff press 2x15 blue tb    Open book supine stretch 5x ea side with 10s hold  Glute bridges 2x10   BTK 2 min   Lumbar rotation w/ball  2 min   Around the worlds 2x45s Dumbell around stomach/back   Blank cell = exercise not performed today  IFC at 80-150Hz  on 40% scan x 15 minutes; no adverse reaction to today's modalities  04/27/2024                                   EXERCISE LOG  Exercise Repetitions and Resistance Comments  Treadmill  x 15 mins        Seated Marches 3# 3x15    LAQs 3# 2x20    HS curl  2x15 red tb    Toe raises/calf raises  2x30s each on airex   Prone  Press-ups  1x12    Modified elbow plank 1x10, 1x5 (opp arm-leg) Cue to lift knees up  Rockerboard 3 mins     Blank cell = exercise not performed today  PATIENT EDUCATION:  Education details: positioning, core exercises and HEP. Person educated: Patient Education method: Explanation, Demonstration, and Tactile cues Education comprehension: verbalized understanding  HOME EXERCISE PROGRAM: Sciatic Nerve Flossing 2x/day 2 minutes total, Piriformis tennis ball massage 2 min/day or to tolerance, standing flexion repeated motions 2 min/day.  ASSESSMENT:  CLINICAL IMPRESSION: Pt arrives for today's treatment session denying any back pain, but does reports that her right knee has been a little sore over the weekend.  Pt introduced to work simulated exercises today to simulate moving and lifting a patient.  Pt requiring min cues for proper body mechanics to avoid reinjurying her low back.  Pt also introduced to cybex knee flexion and extension today with min cues required for eccentric control.  Pt denied any pain at completion of today's treatment session.    OBJECTIVE IMPAIRMENTS: Abnormal gait, decreased activity tolerance, decreased mobility, difficulty walking, decreased ROM, decreased strength, increased muscle spasms, impaired sensation, and pain.   ACTIVITY LIMITATIONS: carrying, lifting, bending, standing, squatting, stairs, and transfers  PARTICIPATION LIMITATIONS: community activity and occupation  PERSONAL FACTORS: Age, Fitness, and 1-2 comorbidities: DM, RA are also affecting patient's functional outcome.   REHAB POTENTIAL: Good  CLINICAL DECISION MAKING: Evolving/moderate complexity  EVALUATION COMPLEXITY: Moderate   GOALS: Goals reviewed with patient? No  SHORT TERM GOALS: Target date: 04/26/24  Patient will be compliant with HEP Baseline: Goal status: MET  2.  Patient will increase lumbar extension AROM to 20 degrees in order to improve mobility for walking with proper  body mechanics Baseline: 8/26: 20 degrees Goal status: MET  3.  Patient will increase hip flexion strength to 4/5 bilaterally in order to improve body mechanics while lifting for work. Baseline: 8/26: 4/5 bil hip flexion  Goal status: MET  LONG TERM GOALS: Target date: 05/17/24  Patient will be able to pull 40 pounds with proper body mechanics and no pain in order to return to caretaking.  Baseline:  Goal status: IN PROGRESS  2.  Patient will be able to maintain lumbar spine in neutral while lifting 20 pounds from ground, without pain, in order to improve function with ADL's Baseline:  Goal status: IN PROGRESS  3.  Patient will be able to lay supine without increased pain in order to sleep through the night comfortably without pain. Baseline: 8/26: does not wake up bc of back pain Goal status: MET  4.  Patient will improve ODI by 5 points in order to demonstrate improved  tolerance with ADL's. Baseline: 8/26: 7/50 Goal status: IN PROGRESS    PLAN:  PT FREQUENCY: 2x/week  PT DURATION: 6 weeks  PLANNED INTERVENTIONS: 97164- PT Re-evaluation, 97750- Physical Performance Testing, 97110-Therapeutic exercises, 97530- Therapeutic activity, 97112- Neuromuscular re-education, 97535- Self Care, 02859- Manual therapy, [phone]- Gait training, [phone]- Electrical stimulation (unattended), [phone]- Traction (mechanical), [phone] (1-2 muscles), [phone] (3+ muscles)- Dry Needling, Patient/Family education, Joint mobilization, Spinal mobilization, Cryotherapy, and Moist heat.  PLAN FOR NEXT SESSION: Plan perform manual therapy, gentle lumbar extension exercises, core strengthening and LE strengthening.   Delon DELENA Gosling, PTA 05/19/2024, 3:14 PM

## 2024-05-20 ENCOUNTER — Ambulatory Visit (INDEPENDENT_AMBULATORY_CARE_PROVIDER_SITE_OTHER)

## 2024-05-20 DIAGNOSIS — Z780 Asymptomatic menopausal state: Secondary | ICD-10-CM | POA: Diagnosis not present

## 2024-05-20 DIAGNOSIS — Z13820 Encounter for screening for osteoporosis: Secondary | ICD-10-CM

## 2024-05-20 DIAGNOSIS — Z0000 Encounter for general adult medical examination without abnormal findings: Secondary | ICD-10-CM

## 2024-05-21 DIAGNOSIS — Z780 Asymptomatic menopausal state: Secondary | ICD-10-CM | POA: Diagnosis not present

## 2024-05-21 DIAGNOSIS — M810 Age-related osteoporosis without current pathological fracture: Secondary | ICD-10-CM | POA: Diagnosis not present

## 2024-05-24 ENCOUNTER — Ambulatory Visit: Payer: Self-pay | Admitting: Nurse Practitioner

## 2024-05-26 ENCOUNTER — Encounter

## 2024-05-28 NOTE — Progress Notes (Deleted)
 Referring Physician:  Gladis Mustard, FNP [address] [place],  [place] [address]  Primary Physician:  Gladis Mustard, FNP  History of Present Illness: Lisa Ball has a history of HTN, CVA, mixed hyperlipidemia, osteopenia, depression, DM, psoriatic arthritis, RA, pulmonary fibrosis.   She sees rheumatology.   Last seen by me on 04/22/24 for LBP and left buttock pain. She has known slip at L4-L5. She has moderate DDD L4-S1.   She was seeing improvement with PT at her last visit and she was to continue it.   She's had 5 more visits of PT (through 05/28/24) since her last visit.   She is here for follow up.         Seen by PCP on 03/30/24 for 6 week history of constant LBP with radiation to left buttocks.   She has constant LBP with left buttock pain x years, but worse in last month. She has numbness in left foot. No tingling or weakness. Pain is worse with prolonged standing or sitting.   Some relief with steroids. Has tried motrin and tylenol #3. Has seen some improvement with PT.   Tobacco use: smokes 1 PPD x 35 years.   Bowel/Bladder Dysfunction: urinary urgency x year, no incontinence issues.   Conservative measures:  Physical therapy: initial eval at [facility] on 04/05/24 with 7 more visits through 05/28/24*** Multimodal medical therapy including regular antiinflammatories: tylenol #3, motrin, steroids  Injections: No epidural steroid injections  Past Surgery: No lumbar surgery  Lisa Ball has no symptoms of cervical myelopathy.  The symptoms are causing a significant impact on the patient's life.   Review of Systems:  A 10 point review of systems is negative, except for the pertinent positives and negatives detailed in the HPI.  Past Medical History: Past Medical History:  Diagnosis Date   Anxiety    Colon polyps    Depression    Hyperlipidemia    Hypertension    Psoriatic arthritis ([name])    Pulmonary fibrosis ([name])    RA  (rheumatoid arthritis) ([name])     Past Surgical History: Past Surgical History:  Procedure Laterality Date   BREAST CYST EXCISION Left    many years ago   CHOLECYSTECTOMY     TUBAL LIGATION      Allergies: Allergies as of 06/03/2024 - Review Complete 05/19/2024  Allergen Reaction Noted   Methotrexate Other (See Comments) and Shortness Of Breath 01/24/2012   Methotrexate derivatives Shortness Of Breath 01/24/2012   Penicillins Other (See Comments) 01/24/2012    Medications: Outpatient Encounter Medications as of 06/03/2024  Medication Sig   albuterol  (VENTOLIN  HFA) 108 (90 Base) MCG/ACT inhaler Inhale 2 puffs into the lungs every 6 (six) hours as needed for wheezing or shortness of breath.   aspirin EC 81 MG tablet Take 81 mg by mouth daily.   atorvastatin  (LIPITOR) 40 MG tablet Take 1 tablet (40 mg total) by mouth daily.   diclofenac Sodium (VOLTAREN) 1 % GEL Apply 2 g topically 4 (four) times daily.   escitalopram  (LEXAPRO ) 20 MG tablet Take 1 tablet (20 mg total) by mouth daily. (Patient taking differently: Take 40 mg by mouth daily.)   fenofibrate  160 MG tablet Take 1 tablet (160 mg total) by mouth daily.   hydroxychloroquine (PLAQUENIL) 200 MG tablet Take 1 tablet by mouth daily.   lisinopril  (ZESTRIL ) 40 MG tablet Take 1 tablet (40 mg total) by mouth daily.   metFORMIN  (GLUCOPHAGE ) 1000 MG tablet Take 1 tablet (1,000 mg  total) by mouth 2 (two) times daily with a meal.   ORENCIA CLICKJECT 125 MG/ML SOAJ Inject 125 mg into the skin.   potassium chloride  SA (KLOR-CON  M) 20 MEQ tablet Take 1 tablet (20 mEq total) by mouth daily.   Secukinumab (COSENTYX SENSOREADY PEN) 150 MG/ML SOAJ Inject the contents of 1 pen into the skin every 4 weeks (Patient not taking: Reported on 05/03/2024)   No facility-administered encounter medications on file as of 06/03/2024.    Social History: Social History   Tobacco Use   Smoking status: Every Day    Current packs/day: 1.00    Average  packs/day: 1 pack/day for 35.0 years (35.0 ttl pk-yrs)    Types: Cigarettes   Smokeless tobacco: Never  Vaping Use   Vaping status: Never Used  Substance Use Topics   Alcohol use: No   Drug use: No    Family Medical History: Family History  Problem Relation Age of Onset   Heart disease Mother    Cancer Father 75       lung cancer-died at age 17   Diabetes Sister    Seizures Maternal Grandmother    Heart disease Maternal Grandfather    Seizures Paternal Grandmother    Heart disease Paternal Grandfather    Breast cancer Paternal Aunt    Colon cancer Neg Hx    Esophageal cancer Neg Hx    Rectal cancer Neg Hx    Stomach cancer Neg Hx     Physical Examination: There were no vitals filed for this visit.  Awake, alert, oriented to person, place, and time.  Speech is clear and fluent. Fund of knowledge is appropriate.   Cranial Nerves: Pupils equal round and reactive to light.  Facial tone is symmetric.    No posterior lumbar tenderness.   No abnormal lesions on exposed skin.   Strength: Side Biceps Triceps Deltoid Interossei Grip Wrist Ext. Wrist Flex.  R 5 5 5 5 5 5 5   L 5 5 5 5 5 5 5    Side Iliopsoas Quads Hamstring PF DF EHL  R 5 5 5 5 5 5   L 5 5 5 5 5 5    Reflexes are 2+ and symmetric at the biceps, brachioradialis, patella and achilles.   Hoffman's is absent.  Clonus is not present.   Bilateral upper and lower extremity sensation is intact to light touch.     No pain with IR/ER of both hips.   Gait is normal.     Medical Decision Making  Imaging: None  Assessment and Plan: [name] has constant LBP with left buttock pain x years, but worse in last month. She has numbness in left foot. No tingling or weakness.   She has known slip at L4-L5. She has moderate DDD L4-S1.   She has seen improvement since starting PT.   Treatment options discussed with patient and following plan made:   - Continue with PT for lumbar spine.  - Can consider lumbar MRI  and possible injections if pain gets worse. She declines for now.  - Follow up with me in 6 weeks and prn.   I spent a total of *** minutes in face-to-face and non-face-to-face activities related to this patient's care today including review of outside records, review of imaging, review of symptoms, physical exam, discussion of differential diagnosis, discussion of treatment options, and documentation.   Glade Boys PA-C Dept. of Neurosurgery

## 2024-06-02 ENCOUNTER — Ambulatory Visit

## 2024-06-02 DIAGNOSIS — M6281 Muscle weakness (generalized): Secondary | ICD-10-CM | POA: Diagnosis not present

## 2024-06-02 DIAGNOSIS — M79605 Pain in left leg: Secondary | ICD-10-CM

## 2024-06-02 DIAGNOSIS — M5459 Other low back pain: Secondary | ICD-10-CM | POA: Diagnosis not present

## 2024-06-02 NOTE — Therapy (Addendum)
 OUTPATIENT PHYSICAL THERAPY THORACOLUMBAR TREATMENT   Patient Name: Lisa Ball MRN: [id number] DOB:05/05/1961, 63 y.o., female Today's Date: 06/02/2024  END OF SESSION:  PT End of Session - 06/02/24 1431     Visit Number 10    Number of Visits 12    Date for PT Re-Evaluation 06/11/24    PT Start Time 1430    PT Stop Time 1454    PT Time Calculation (min) 24 min    Activity Tolerance Patient tolerated treatment well    Behavior During Therapy [facility] for tasks assessed/performed          Past Medical History:  Diagnosis Date   Anxiety    Colon polyps    Depression    Hyperlipidemia    Hypertension    Psoriatic arthritis ([name])    Pulmonary fibrosis ([name])    RA (rheumatoid arthritis) ([name])    Past Surgical History:  Procedure Laterality Date   BREAST CYST EXCISION Left    many years ago   CHOLECYSTECTOMY     TUBAL LIGATION     Patient Active Problem List   Diagnosis Date Noted   Seropositive rheumatoid arthritis of multiple sites ([name]) 10/09/2023   Diabetes mellitus treated with oral medication ([name]) 04/07/2023   History of CVA (cerebrovascular accident) 10/01/2021   Personal history of tobacco use, presenting hazards to health 06/05/2021   Osteoarthritis of multiple joints 10/27/2018   Psoriasis 12/23/2017   BMI 29.0-29.9,adult 06/20/2016   Smoker 09/19/2014   Depression 03/22/2013   Hypertension 03/22/2013   Osteopenia 03/22/2013   Hyperlipidemia 03/22/2013   Psoriatic arthritis ([name]) 03/22/2013    PCP: Gladis Ronal Quant, FNP  REFERRING PROVIDER: Severa Rock HERO, FNP  REFERRING DIAG: Acute bilateral low back pain with left-sided sciatica   Rationale for Evaluation and Treatment: Rehabilitation  THERAPY DIAG:  Other low back pain  Muscle weakness (generalized)  Pain in left leg  ONSET DATE: One month ago   SUBJECTIVE:                                                                                                                                                                                            SUBJECTIVE STATEMENT: Pt denies any pain today.  PERTINENT HISTORY:  DM, HTN, Osteopenia, RA, OA, depression, anxiety, smoker  PAIN:  Are you having pain? No  PRECAUTIONS: None  RED FLAGS: None   WEIGHT BEARING RESTRICTIONS: No  FALLS:  Has patient fallen in last 6 months? No  LIVING ENVIRONMENT: Lives with: lives with their family, care taker to cousin Lives in: House/apartment Stairs: No Has following equipment at home: shower chair  and Grab bars, ramp  OCCUPATION: caretaker   PLOF: Independent with community mobility without device  PATIENT GOALS: reduce pain, get back to care taking, cousin weighs 200 pounds and needs to pull him and turn him.  NEXT MD VISIT: 04/12/2024  OBJECTIVE:  Note: Objective measures were completed at Evaluation unless otherwise noted.  DIAGNOSTIC FINDINGS:   03/30/24 lumbar x-ray 1. Grade 1 anterolisthesis of L4, presumed chronic. 2. Mild multilevel spondylosis with anterior osteophytosis and disc space height loss, greatest at L5-S1 where it is moderate. 3. Advanced facet arthropathy at L4-L5 and L5-S1.  PATIENT SURVEYS:  Modified Oswestry Disability Index: 10/50  COGNITION: Overall cognitive status: Within functional limits for tasks assessed     SENSATION: Diminished L2-S1 dermatomes on L compared to R   POSTURE: rounded shoulders, forward head, and decreased lumbar lordosis  PALPATION: Muscles: TTP along Lumbar paraspinals L3-L5, L Piriformis, L QL, L glute med.   Joint: Hypomobile L2-L5, PSIS was TTP  LUMBAR ROM:   AROM eval  Flexion 78  Extension 10  Right lateral flexion To patella  Left lateral flexion To patella  Right rotation 25% limited  Left rotation 25% limited   (Blank rows = not tested)   LOWER EXTREMITY MMT:    MMT Right eval Left eval  Hip flexion 4- 3+ painful  Hip extension    Hip abduction    Hip adduction    Hip internal  rotation    Hip external rotation    Knee flexion 4 4-  Knee extension 4 4  Ankle dorsiflexion 4- 3+  Ankle plantarflexion    Ankle inversion    Ankle eversion     (Blank rows = not tested)  LUMBAR SPECIAL TESTS:  Slump test: Positive (L leg) R Leg (painful on L side) Repeated Motions: Forward flexion 10x, no exacerbation of symptoms   GAIT: Distance walked: To be evaluated next visit Assistive device utilized: None Level of assistance: Complete Independence Comments: Patient ambulates with decreased lordosis, decreased stance time on L.  TREATMENT DATE:   06/02/2024                                   EXERCISE LOG  Exercise Repetitions and Resistance Comments  TM 1.2 mph x 16 min   Standing Marching    LAQ    Paloff Press     Standing hip abd    Seated hip add    Standing hip extension    Rows    Extensions    Squats    Simulated Pt Pulling    Cybex Knee Flexion    Cybex Knee Extension    Lumbar side bending     Open Book stretch    Goal Assessment See Below    Blank cell = exercise not performed today   04/29/2024                                    EXERCISE LOG  Exercise Repetitions and Resistance Comments  Nustep L3x15 min   Prone press-ups 2x10  2nd set with knee raises  Seated Marches 4# 2x30s   LAQ 4# 2x30s   Paloff press 2x15 blue tb    Open book supine stretch 5x ea side with 10s hold   Glute bridges 2x10   BTK 2 min   Lumbar rotation w/ball  2 min   Around the worlds 2x45s Dumbell around stomach/back   Blank cell = exercise not performed today  IFC at 80-150Hz  on 40% scan x 15 minutes; no adverse reaction to today's modalities  04/27/2024                                   EXERCISE LOG  Exercise Repetitions and Resistance Comments  Treadmill  x 15 mins        Seated Marches 3# 3x15    LAQs 3# 2x20    HS curl  2x15 red tb    Toe raises/calf raises  2x30s each on airex   Prone Press-ups  1x12    Modified elbow plank 1x10, 1x5 (opp  arm-leg) Cue to lift knees up  Rockerboard 3 mins     Blank cell = exercise not performed today  PATIENT EDUCATION:  Education details: positioning, core exercises and HEP. Person educated: Patient Education method: Explanation, Demonstration, and Tactile cues Education comprehension: verbalized understanding  HOME EXERCISE PROGRAM: Sciatic Nerve Flossing 2x/day 2 minutes total, Piriformis tennis ball massage 2 min/day or to tolerance, standing flexion repeated motions 2 min/day.  ASSESSMENT:  CLINICAL IMPRESSION: Pt arrives for today's treatment session denying any back pain.  Pt able to decrease ODI score to 0/50 surpassing her LTG.  PT able to lift 20# of weight and pull 40# of weight with proper body mechanics, meeting those LTGs as well.  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  Pt verbalizes and demonstrates understanding.  Pt encouraged to call the facility with any questions or concerns.  Pt denied any pain at completion of today's treatment session.  Pt ready for discharge at this time.   OBJECTIVE IMPAIRMENTS: Abnormal gait, decreased activity tolerance, decreased mobility, difficulty walking, decreased ROM, decreased strength, increased muscle spasms, impaired sensation, and pain.   ACTIVITY LIMITATIONS: carrying, lifting, bending, standing, squatting, stairs, and transfers  PARTICIPATION LIMITATIONS: community activity and occupation  PERSONAL FACTORS: Age, Fitness, and 1-2 comorbidities: DM, RA are also affecting patient's functional outcome.   REHAB POTENTIAL: Good  CLINICAL DECISION MAKING: Evolving/moderate complexity  EVALUATION COMPLEXITY: Moderate   GOALS: Goals reviewed with patient? No  SHORT TERM GOALS: Target date: 04/26/24  Patient will be compliant with HEP Baseline: Goal status: MET  2.  Patient will increase lumbar extension AROM to 20 degrees in order to improve mobility for walking with proper  body mechanics Baseline: 8/26: 20 degrees Goal status: MET  3.  Patient will increase hip flexion strength to 4/5 bilaterally in order to improve body mechanics while lifting for work. Baseline: 8/26: 4/5 bil hip flexion  Goal status: MET  LONG TERM GOALS: Target date: 05/17/24  Patient will be able to pull 40 pounds with proper body mechanics and no pain in order to return to caretaking.  Baseline:  Goal status: MET  2.  Patient will be able to maintain lumbar spine in neutral while lifting 20 pounds from ground, without pain, in order to improve function with ADL's Baseline:  Goal status: MET  3.  Patient will be able to lay supine without increased pain in order to sleep through the night comfortably without pain. Baseline: 8/26: does not wake up bc of back pain Goal status: MET  4.  Patient will improve ODI by 5 points in order to demonstrate improved tolerance with ADL's. Baseline:  8/26: 7/50; 9/17: 0/50 Goal status: MET    PLAN:  PT FREQUENCY: 2x/week  PT DURATION: 6 weeks  PLANNED INTERVENTIONS: 02835- PT Re-evaluation, 97750- Physical Performance Testing, 97110-Therapeutic exercises, 97530- Therapeutic activity, 97112- Neuromuscular re-education, 97535- Self Care, 02859- Manual therapy, [id number]- Gait training, [phone]- Electrical stimulation (unattended), [phone]- Traction (mechanical), 20560 (1-2 muscles), 20561 (3+ muscles)- Dry Needling, Patient/Family education, Joint mobilization, Spinal mobilization, Cryotherapy, and Moist heat.  PLAN FOR NEXT SESSION: Plan perform manual therapy, gentle lumbar extension exercises, core strengthening and LE strengthening.   Delon DELENA Gosling, PTA 06/02/2024, 2:58 PM

## 2024-06-03 ENCOUNTER — Ambulatory Visit: Admitting: Orthopedic Surgery

## 2024-08-16 ENCOUNTER — Other Ambulatory Visit: Payer: Self-pay

## 2024-08-16 DIAGNOSIS — Z87891 Personal history of nicotine dependence: Secondary | ICD-10-CM

## 2024-08-16 DIAGNOSIS — F17210 Nicotine dependence, cigarettes, uncomplicated: Secondary | ICD-10-CM

## 2024-08-16 DIAGNOSIS — Z122 Encounter for screening for malignant neoplasm of respiratory organs: Secondary | ICD-10-CM

## 2024-08-23 ENCOUNTER — Ambulatory Visit (HOSPITAL_COMMUNITY)

## 2024-08-27 NOTE — Progress Notes (Signed)
 Chief Complaint  Patient presents with   office visit    26m     HISTORY OF PRESENT ILLNESS: 63 y.o. female who is here for follow up of seropositive rheumatoid arthritis. Pt was diagnosed with psoriatic arthritis a diagnosis made by Dr Missie in 2010. Labs revealed high titer RF and CCP, helping to clarify her true diagnosis of seropositive rheumatoid arthritis. We switched her from Cosentyx to Orencia, which has been a secretary/administrator for her. Last visit we added Plaquenil 200 mg, which again helped. Today she is doing well. She has had some increase of hand pain with activity lately. With that she has little, if any, pain when she is restful. She does have significant joint swelling, or morning stiffness. Denies any malar rash, oral ulcers, alopecia, pleuritic pain or shortness of breath, photosensitivity, dry eye or dry mouth, Raynaud's phenomenon, headache, jaw claudication or visual changes.   Historic Rheumatologic Medications: Siliq (IL-17, cost issue), Enbrel (lost efficacy), Humira (ineffective), Plaquenil, methotrexate (per pt stopped for lung issue)   Historic Rheumatologic Medications:  DEXA:  PAST HISTORY: Past Medical History:  Diagnosis Date   Arthritis    COPD (chronic obstructive pulmonary disease) (CMD)    Psoriasis     FAMILY HISTORY: Family History  Problem Relation Name Age of Onset   Cancer Father     Psoriasis Paternal Aunt     Psoriasis Paternal Grandfather     Psoriasis Cousin      SOCIAL HISTORY: Social History   Socioeconomic History   Marital status: Single    Spouse name: Not on file   Number of children: Not on file   Years of education: Not on file   Highest education level: Not on file  Occupational History   Not on file  Tobacco Use   Smoking status: Every Day    Current packs/day: 1.00    Types: Cigarettes    Passive exposure: Never   Smokeless tobacco: Never   Tobacco comments:    pack a day  Substance and Sexual  Activity   Alcohol use: No   Drug use: No   Sexual activity: Not on file  Other Topics Concern   Not on file  Social History Narrative   Not on file   Social Drivers of Health   Living Situation: Unknown (05/03/2024)   Received from [facility]   Living Situation    In the last 12 months, was there a time when you were not able to pay the mortgage or rent on time?: No    Number of Times Moved in the Last Year: Not on file    At any time in the past 12 months, were you homeless or living in a shelter (including now)?: No  Food Insecurity: No Food Insecurity (05/03/2024)   Received from [facility]   Food vital sign    Within the past 12 months, you worried that your food would run out before you got the money to buy more.: Never true    Within the past 12 months, the food you bought just didn't last and you didn't have money to get more.: Never true  Transportation Needs: No Transportation Needs (05/03/2024)   Received from [facility] - Transportation    In the past 12 months, has lack of transportation kept you from medical appointments or from getting medications?: No    In the past 12 months, has lack of transportation kept you from meetings, work, or from  getting things needed for daily living?: No  Utilities: Patient Unable To Answer (05/03/2024)   Received from [facility]   Utilities    In the past 12 months has the electric, gas, oil, or water company threatened to shut off services in your home?: Patient unable to answer  Safety: Not At Risk (05/03/2024)   Received from [facility]   Safety    Within the last year, have you been afraid of your partner or ex-partner?: No    Within the last year, have you been humiliated or emotionally abused in other ways by your partner or ex-partner?: No    Within the last year, have you been kicked, hit, slapped, or otherwise physically hurt by your partner or ex-partner?: No    Within the last year, have you been  raped or forced to have any kind of sexual activity by your partner or ex-partner?: No  Alcohol Screening: Not At Risk (09/25/2021)   Received from [facility]   AUDIT-C    Q1: How often do you have a drink containing alcohol?: Never    Q2: How many drinks containing alcohol do you have on a typical day when you are drinking?: Patient does not drink    Q3: How often do you have six or more drinks on one occasion?: Never  Tobacco Use: High Risk (08/27/2024)   Patient History    Smoking Tobacco Use: Every Day    Smokeless Tobacco Use: Never    Passive Exposure: Never  Depression: Not at risk (10/10/2022)   Received from [facility] visits prior to 11/16/2022.   PHQ-2    SDOH PHQ2 SCORE: 0  Social Connections: Moderately Isolated (05/03/2024)   Received from [facility]   Social Connection and Isolation Panel    In a typical week, how many times do you talk on the phone with family, friends, or neighbors?: More than three times a week    How often do you get together with friends or relatives?: More than three times a week    How often do you attend church or religious services?: More than 4 times per year    Do you belong to any clubs or organizations such as church groups, unions, fraternal or athletic groups, or school groups?: No    How often do you attend meetings of the clubs or organizations you belong to?: Never    Are you married, widowed, divorced, separated, never married, or living with a partner?: Widowed  Physicist, Medical Strain: Low Risk (05/03/2024)   Received from [facility]   Overall Financial Resource Strain (CARDIA)    How hard is it for you to pay for the very basics like food, housing, medical care, and heating?: Not hard at all    CURRENT MEDICATIONS:   Current Outpatient Medications  Medication Sig Dispense Refill   abatacept (Orencia ClickJect) 125 mg/mL atIn Inject 125 mg into the skin every 7 days. 4 mL 5   acetaminophen  (TYLENOL) 325 mg tablet Take 650 mg by mouth every 6 (six) hours as needed (pain).     albuterol  HFA (PROVENTIL  HFA;VENTOLIN  HFA;PROAIR  HFA) 90 mcg/actuation inhaler Inhale.     aspirin 81 mg EC tablet Take 81 mg by mouth Once Daily.     atorvastatin  (LIPITOR) 40 mg tablet Take 40 mg by mouth Once Daily.     diclofenac sodium (VOLTAREN) 1 % gel Apply 1 g topically 4 (four) times a day. 450 g 1  escitalopram  (LEXAPRO ) 20 mg tablet Take 20 mg by mouth Once Daily.     fenofibrate  (LOFIBRA) 160 mg tablet Take 160 mg by mouth Once Daily.     hydroxychloroquine (PLAQUENIL) 200 mg tablet TAKE 1 TABLET BY MOUTH EVERY DAY 90 tablet 1   lisinopriL  (PRINIVIL ) 40 mg tablet Take 40 mg by mouth daily.     metFORMIN  (GLUCOPHAGE ) 500 mg tablet 2 (two) times a day with meals.     oxymetazoline (AFRIN) 0.05 % nasal spray Use as directed as needed.      potassium chloride  20 mEq ER tablet Take 20 mEq by mouth daily.     betamethasone diproprionate, augmented (DIPROLENE AF) 0.05 % oint Apply  topically 2 (two) times a day. (Patient not taking: Reported on 08/27/2024) 150 g 5   metFORMIN  (GLUCOPHAGE ) 1,000 mg tablet 2 (two) times a day. with food (Patient not taking: Reported on 08/27/2024)     No current facility-administered medications for this visit.    ALLERGIES: Allergies as of 08/27/2024 - Reviewed 08/27/2024  Allergen Reaction Noted   Methotrexate Other (See Comments) and Shortness Of Breath 01/24/2012   Penicillins Other (See Comments) 01/24/2012     REVIEW OF SYSTEMS : - See HPI.  A complete review of systems was performed and the pertinent positives and negatives can be found in the HPI. All other systems are negative.   PHYSICAL EXAM: BP 149/88   Pulse 67   Ht 1.651 m (5' 5)   Wt 69.3 kg (152 lb 11.2 oz)   BMI 25.41 kg/m  General:  Well appearing, well nourished in no distress.  Skin: No rash, sclerodactyly, telangiectasias or digital pits. Hair:  Normal texture and  distribution   Head:  Normocephalic, atraumatic   Eyes:  Conjunctivae clear, sclerae non-icteric   Mouth:  Mucous membranes moist, no mucosal lesions.   Teeth/Gums:  No obvious caries or periodontal disease   Neck:  Supple, without lesions or adenopathy   Heart:  Regular rate, no murmur or gallop   Lungs:  Clear to auscultation, no wheezes or crackles.  Back:  Spine normal without deformity or tenderness Extremities:  no LE edema, no cyanosis or clubbing. Musculoskeletal:  Normal gait, no joint swelling, warmth or tenderness of UE/LE joints    There is currently no information documented on the homunculus. Go to the Rheumatology activity and complete the homunculus joint exam.   Joint Exam 08/27/2024   No joint exam has been documented for this visit   Lymphatics:  No lymphadenopathy in cervical area   Neurologic: 5/5 muscle strength in UE/LE bilaterally in flexors and extensors. Psychiatric:  Good judgment and insight, normal mood and affect.    Lab:   Office Visit on 02/18/2024  Component Date Value Ref Range Status   Creatinine 02/18/2024 0.85  0.60 - 1.20 mg/dL Final   eGFR 93/95/7974 77  >59 mL/min/1.19m2 Final   GFR estimated by CKD-EPI equations(NKF 2021).   Recommend confirmation of Cr-based eGFR by using Cys-based eGFR and other filtration markers (if applicable) in complex cases and clinical decision-making, as needed.   Albumin 02/18/2024 4.1  3.5 - 5.7 g/dL Final   Bilirubin, Total 02/18/2024 0.6  0.3 - 1.0 mg/dL Final   Bilirubin, Direct 02/18/2024 0.1  0.0 - 0.2 mg/dL Final   Alkaline Phosphatase (ALP) 02/18/2024 55  34 - 104 U/L Final   Aspartate Aminotransferase (AST) 02/18/2024 18  13 - 39 U/L Final   Alanine Aminotransferase (ALT) 02/18/2024 14  7 -  52 U/L Final   Total Protein 02/18/2024 6.6  6.4 - 8.9 g/dL Final   WBC 93/95/7974 7.40  4.40 - 11.00 10*3/uL Final   RBC 02/18/2024 4.65  4.10 - 5.10 10*6/uL Final   Hemoglobin 02/18/2024 13.7   12.3 - 15.3 g/dL Final   Hematocrit 93/95/7974 41.3  35.9 - 44.6 % Final   Mean Corpuscular Volume (MCV) 02/18/2024 88.8  80.0 - 96.0 fL Final   Mean Corpuscular Hemoglobin (MCH) 02/18/2024 29.5  27.5 - 33.2 pg Final   Mean Corpuscular Hemoglobin Conc (* 02/18/2024 33.2  33.0 - 37.0 g/dL Final   Red Cell Distribution Width (RDW) 02/18/2024 14.6  12.3 - 17.0 % Final   Platelet Count (PLT) 02/18/2024 322  150 - 450 10*3/uL Final   Mean Platelet Volume (MPV) 02/18/2024 9.5  6.8 - 10.2 fL Final   DEXA/Imaging: 12/2017: DEXA was normal.  04/2016: XR hands. Mostly normal. Report: Marginal erosion with new bone formation at the base of the third proximal phalanx may be sequela of psoriatic arthropathy. I did not note this upon review.   ASSESSMENT:  Seropositive rheumatoid arthritis (synovitis, high titer CCP and RF). Currently on Orencia. She has continued to improve on Orencia. Today her arthritis is stable.   We discussed the nature of osteoarthritis and both pharmacologic and non-pharmacologic ways to manage the symptoms. Pharmacologic options include NSAIDs, Tylenol and topical agents like Voltaren gel. Steroid injections and physical therapy are also adjunctive therapy.   HTN, 170/90 today. She has been making some changes to her BP meds with pcp. She has an upcomming appointment and plans to discuss. I recommend she take her BP at home twice daily and report findings to her primary.   High risk medication: Plaquenil: Started 10/08/2023 Pt needs baseline eye exam. I sent referral to internal ophthalmology.   PLAN: - Continue Orencia - Continue Plaquenil 200 mg daily - Voltaren gel 4 x daily - CBC, Hepatic function and creatinine every 4-6 months - FU 3 months   No orders of the defined types were placed in this encounter.  I have personally spent 30 minutes involved in face-to-face and non-face-to-face activities for this patient on the day of the visit.  Professional time  spent includes the following activities, in addition to those noted in the documentation: Documentation and chart review.    CC:No referring provider defined for this encounter.

## 2024-09-01 ENCOUNTER — Ambulatory Visit (HOSPITAL_COMMUNITY): Admission: RE | Admit: 2024-09-01 | Discharge: 2024-09-01 | Attending: Acute Care | Admitting: Acute Care

## 2024-09-01 DIAGNOSIS — F17210 Nicotine dependence, cigarettes, uncomplicated: Secondary | ICD-10-CM | POA: Insufficient documentation

## 2024-09-01 DIAGNOSIS — Z87891 Personal history of nicotine dependence: Secondary | ICD-10-CM | POA: Diagnosis present

## 2024-09-01 DIAGNOSIS — Z122 Encounter for screening for malignant neoplasm of respiratory organs: Secondary | ICD-10-CM | POA: Insufficient documentation

## 2024-09-13 ENCOUNTER — Other Ambulatory Visit: Payer: Self-pay | Admitting: Acute Care

## 2024-09-13 DIAGNOSIS — Z87891 Personal history of nicotine dependence: Secondary | ICD-10-CM

## 2024-09-13 DIAGNOSIS — F17210 Nicotine dependence, cigarettes, uncomplicated: Secondary | ICD-10-CM

## 2024-09-13 DIAGNOSIS — Z122 Encounter for screening for malignant neoplasm of respiratory organs: Secondary | ICD-10-CM

## 2024-10-18 ENCOUNTER — Ambulatory Visit: Payer: Self-pay | Admitting: Nurse Practitioner

## 2024-10-25 ENCOUNTER — Ambulatory Visit: Admitting: Nurse Practitioner

## 2025-05-04 ENCOUNTER — Ambulatory Visit: Payer: Self-pay

## 2025-05-06 ENCOUNTER — Ambulatory Visit
